# Patient Record
Sex: Female | Born: 1950
Health system: Southern US, Community
[De-identification: ages and names within clinical notes are randomized; demographics above are authoritative.]

## PROBLEM LIST (undated history)

## (undated) DIAGNOSIS — R5382 Chronic fatigue, unspecified: Secondary | ICD-10-CM

## (undated) DIAGNOSIS — U071 COVID-19: Secondary | ICD-10-CM

## (undated) DIAGNOSIS — J45909 Unspecified asthma, uncomplicated: Secondary | ICD-10-CM

## (undated) DIAGNOSIS — G4730 Sleep apnea, unspecified: Secondary | ICD-10-CM

## (undated) DIAGNOSIS — E213 Hyperparathyroidism, unspecified: Secondary | ICD-10-CM

## (undated) DIAGNOSIS — G43909 Migraine, unspecified, not intractable, without status migrainosus: Secondary | ICD-10-CM

## (undated) DIAGNOSIS — F32A Depression, unspecified: Secondary | ICD-10-CM

## (undated) DIAGNOSIS — I10 Essential (primary) hypertension: Secondary | ICD-10-CM

## (undated) DIAGNOSIS — M1990 Unspecified osteoarthritis, unspecified site: Secondary | ICD-10-CM

## (undated) DIAGNOSIS — T7840XA Allergy, unspecified, initial encounter: Secondary | ICD-10-CM

## (undated) HISTORY — PX: REPLACEMENT TOTAL KNEE BILATERAL: SUR1225

## (undated) HISTORY — PX: TOTAL HIP ARTHROPLASTY: SHX124

## (undated) HISTORY — DX: Unspecified asthma, uncomplicated: J45.909

## (undated) HISTORY — DX: Allergy, unspecified, initial encounter: T78.40XA

## (undated) HISTORY — DX: Migraine, unspecified, not intractable, without status migrainosus: G43.909

## (undated) HISTORY — PX: LIGAMENT REPAIR: SHX5444

## (undated) HISTORY — DX: Essential (primary) hypertension: I10

## (undated) HISTORY — DX: Chronic fatigue, unspecified: R53.82

---

## 1983-06-06 HISTORY — PX: ABDOMINAL HYSTERECTOMY: SHX81

## 2006-10-15 ENCOUNTER — Encounter: Admission: RE | Admit: 2006-10-15 | Discharge: 2006-10-15 | Payer: Self-pay | Admitting: Orthopedic Surgery

## 2008-04-23 ENCOUNTER — Emergency Department (HOSPITAL_BASED_OUTPATIENT_CLINIC_OR_DEPARTMENT_OTHER): Admission: EM | Admit: 2008-04-23 | Discharge: 2008-04-23 | Payer: Self-pay | Admitting: Emergency Medicine

## 2008-11-21 ENCOUNTER — Emergency Department (HOSPITAL_BASED_OUTPATIENT_CLINIC_OR_DEPARTMENT_OTHER): Admission: EM | Admit: 2008-11-21 | Discharge: 2008-11-21 | Payer: Self-pay | Admitting: Emergency Medicine

## 2008-11-21 ENCOUNTER — Ambulatory Visit: Payer: Self-pay | Admitting: Diagnostic Radiology

## 2009-12-05 ENCOUNTER — Emergency Department (HOSPITAL_BASED_OUTPATIENT_CLINIC_OR_DEPARTMENT_OTHER): Admission: EM | Admit: 2009-12-05 | Discharge: 2009-12-05 | Payer: Self-pay | Admitting: Emergency Medicine

## 2009-12-05 ENCOUNTER — Ambulatory Visit: Payer: Self-pay | Admitting: Diagnostic Radiology

## 2010-08-21 LAB — URINALYSIS, ROUTINE W REFLEX MICROSCOPIC
Bilirubin Urine: NEGATIVE
Glucose, UA: NEGATIVE mg/dL
Hgb urine dipstick: NEGATIVE
Ketones, ur: NEGATIVE mg/dL
Nitrite: NEGATIVE
Protein, ur: NEGATIVE mg/dL
Specific Gravity, Urine: 1.017 (ref 1.005–1.030)
Urobilinogen, UA: 0.2 mg/dL (ref 0.0–1.0)
pH: 7 (ref 5.0–8.0)

## 2010-08-21 LAB — CBC
HCT: 38.8 % (ref 36.0–46.0)
Hemoglobin: 13 g/dL (ref 12.0–15.0)
MCH: 29.7 pg (ref 26.0–34.0)
MCHC: 33.5 g/dL (ref 30.0–36.0)
MCV: 88.7 fL (ref 78.0–100.0)
Platelets: 291 K/uL (ref 150–400)
RBC: 4.37 MIL/uL (ref 3.87–5.11)
RDW: 12.2 % (ref 11.5–15.5)
WBC: 10.1 K/uL (ref 4.0–10.5)

## 2010-08-21 LAB — COMPREHENSIVE METABOLIC PANEL WITH GFR
BUN: 18 mg/dL (ref 6–23)
Chloride: 108 meq/L (ref 96–112)
GFR calc Af Amer: >60 mL/min (ref >60)
Glucose, Bld: 103 mg/dL — ABNORMAL HIGH (ref 70–99)
Potassium: 4.3 meq/L (ref 3.5–5.1)
Total Protein: 7 g/dL (ref 6.0–8.3)

## 2010-08-21 LAB — DIFFERENTIAL
Basophils Absolute: 0.1 10*3/uL (ref 0.0–0.1)
Basophils Relative: 1 % (ref 0–1)
Eosinophils Absolute: 1.8 10*3/uL — ABNORMAL HIGH (ref 0.0–0.7)
Eosinophils Relative: 18 % — ABNORMAL HIGH (ref 0–5)
Lymphocytes Relative: 18 % (ref 12–46)
Lymphs Abs: 1.8 10*3/uL (ref 0.7–4.0)
Monocytes Absolute: 0.7 10*3/uL (ref 0.1–1.0)
Monocytes Relative: 7 % (ref 3–12)
Neutro Abs: 5.7 10*3/uL (ref 1.7–7.7)
Neutrophils Relative %: 57 % (ref 43–77)

## 2010-08-21 LAB — COMPREHENSIVE METABOLIC PANEL
ALT: 16 U/L (ref 0–35)
AST: 22 U/L (ref 0–37)
Albumin: 3.7 g/dL (ref 3.5–5.2)
Alkaline Phosphatase: 99 U/L (ref 39–117)
CO2: 29 mEq/L (ref 19–32)
Calcium: 9.3 mg/dL (ref 8.4–10.5)
Creatinine, Ser: 0.7 mg/dL (ref 0.4–1.2)
GFR calc non Af Amer: >60 mL/min (ref >60)
Sodium: 144 mEq/L (ref 135–145)
Total Bilirubin: 0.5 mg/dL (ref 0.3–1.2)

## 2010-08-21 LAB — LIPASE, BLOOD: Lipase: 149 U/L (ref 23–300)

## 2012-11-21 ENCOUNTER — Encounter: Payer: Self-pay | Admitting: Family Medicine

## 2012-11-21 ENCOUNTER — Ambulatory Visit (INDEPENDENT_AMBULATORY_CARE_PROVIDER_SITE_OTHER): Payer: BC Managed Care – PPO | Admitting: Family Medicine

## 2012-11-21 VITALS — BP 184/99 | HR 76 | Temp 97.6°F | Ht 64.0 in | Wt 204.6 lb

## 2012-11-21 DIAGNOSIS — M25519 Pain in unspecified shoulder: Secondary | ICD-10-CM

## 2012-11-21 DIAGNOSIS — M25511 Pain in right shoulder: Secondary | ICD-10-CM

## 2012-11-21 DIAGNOSIS — J4551 Severe persistent asthma with (acute) exacerbation: Secondary | ICD-10-CM

## 2012-11-21 DIAGNOSIS — J45901 Unspecified asthma with (acute) exacerbation: Secondary | ICD-10-CM

## 2012-11-21 MED ORDER — FLUTICASONE-SALMETEROL 100-50 MCG/DOSE IN AEPB: 1.0000 | INHALATION_SPRAY | Freq: Two times a day (BID) | RESPIRATORY_TRACT | Status: DC

## 2012-11-21 MED ORDER — PREDNISONE 50 MG PO TABS: ORAL_TABLET | ORAL | Status: DC

## 2012-11-21 MED ORDER — CYCLOBENZAPRINE HCL 5 MG PO TABS: 5.0000 mg | ORAL_TABLET | Freq: Three times a day (TID) | ORAL | Status: DC | PRN

## 2013-01-07 NOTE — Progress Notes (Signed)
°  Subjective:    Patient ID: Traci Johnson, female    DOB: [birth year], 62 y.o.   MRN: [id number]  HPI R shoulder pain x 2 weeks  Pt with prior hx/o R shoulder pain in the past Feels like she may have over exerted her self doing house work  No radicular sxs  No known trauma.   Pt also with mild wheezing over past week No SOB Baseline hx/o asthma.   Review of Systems  All other systems reviewed and are negative.       Objective:   Physical Exam  Constitutional:  Obese, NAD     HENT:  Head: Normocephalic and atraumatic.  Eyes: Conjunctivae are normal. Pupils are equal, round, and reactive to light.  Neck: Normal range of motion.  Cardiovascular: Normal rate and regular rhythm.   Pulmonary/Chest: Effort normal.  Mild wheezing    Abdominal: Soft.  Musculoskeletal:       Arms: Neurological: She is alert.  Skin: Skin is warm.          Assessment & Plan:  Pain in joint, shoulder region, right - Plan: predniSONE (DELTASONE) 50 MG tablet, cyclobenzaprine (FLEXERIL) 5 MG tablet  Asthma exacerbation, non-allergic, severe persistent - Plan: Fluticasone-Salmeterol (ADVAIR) 100-50 MCG/DOSE AEPB  Plan as above  Advair for long term maintenance Discussed resp red flags. Oral prednisone should help with shoulder pain and resp issues.  Discussed MSK red flags.  Follow up as needed.

## 2013-06-19 ENCOUNTER — Encounter: Payer: Self-pay | Admitting: Family Medicine

## 2013-06-19 ENCOUNTER — Ambulatory Visit (INDEPENDENT_AMBULATORY_CARE_PROVIDER_SITE_OTHER): Payer: BC Managed Care – PPO | Admitting: Family Medicine

## 2013-06-19 VITALS — BP 162/86 | HR 87 | Temp 96.5°F | Ht 64.0 in | Wt 212.8 lb

## 2013-06-19 DIAGNOSIS — J45909 Unspecified asthma, uncomplicated: Secondary | ICD-10-CM

## 2013-06-19 DIAGNOSIS — Z23 Encounter for immunization: Secondary | ICD-10-CM

## 2013-06-19 DIAGNOSIS — M1990 Unspecified osteoarthritis, unspecified site: Secondary | ICD-10-CM

## 2013-06-19 DIAGNOSIS — I10 Essential (primary) hypertension: Secondary | ICD-10-CM

## 2013-06-19 DIAGNOSIS — M129 Arthropathy, unspecified: Secondary | ICD-10-CM

## 2013-06-19 DIAGNOSIS — M542 Cervicalgia: Secondary | ICD-10-CM

## 2013-06-19 MED ORDER — MELOXICAM 15 MG PO TABS: 15.0000 mg | ORAL_TABLET | Freq: Every day | ORAL | Status: DC

## 2013-06-19 MED ORDER — FLUTICASONE-SALMETEROL 100-50 MCG/DOSE IN AEPB: 1.0000 | INHALATION_SPRAY | Freq: Two times a day (BID) | RESPIRATORY_TRACT | Status: DC

## 2013-06-19 MED ORDER — LISINOPRIL 10 MG PO TABS: 10.0000 mg | ORAL_TABLET | Freq: Every day | ORAL | Status: DC

## 2013-06-19 MED ORDER — CYCLOBENZAPRINE HCL 5 MG PO TABS: 5.0000 mg | ORAL_TABLET | Freq: Three times a day (TID) | ORAL | Status: DC | PRN

## 2013-06-19 NOTE — Progress Notes (Signed)
° °  Subjective:    Patient ID: Traci Johnson, female    DOB: 01-20-1951, 63 y.o.   MRN: [id number]  HPI This 63 y.o. female presents for evaluation of c/o pain in her neck and shoulders.  She States she has difficulty sleeping.  She states she has bp problems, cramps in legs and her Feet at hs.  She has hx cluster ha's and sees neurology prn.  She snores.  She has insomnia she States because she hurts a lot. She states she has a broken tail and gets steroid injections from A pain doctor.  She gets steroid injections in her back.  She has pain level of 8 on scale of 1-10.   Review of Systems C/o arthralgias/ myalgias. No chest pain, SOB, HA, dizziness, vision change, N/V, diarrhea, constipation, dysuria, urinary urgency or frequency or rash.     Objective:   Physical Exam  Vital signs noted  Well developed well nourished female.  HEENT - Head atraumatic Normocephalic                Eyes - PERRLA, Conjuctiva - clear Sclera- Clear EOMI                Ears - EAC's Wnl TM's Wnl Gross Hearing WNL                Nose - Nares patent                 Throat - oropharanx wnl Respiratory - Lungs CTA bilateral Cardiac - RRR S1 and S2 without murmur GI - Abdomen soft Nontender and bowel sounds active x 4 Extremities - No edema. Neuro - Grossly intact.      Assessment & Plan:  Asthma - Plan: Fluticasone-Salmeterol (ADVAIR) 100-50 MCG/DOSE AEPB  Cervicalgia - Plan: cyclobenzaprine (FLEXERIL) 5 MG tablet.  Recommend massage therapist.  Essential hypertension, benign - Plan: lisinopril (PRINIVIL,ZESTRIL) 10 MG tablet  Arthritis - Plan: meloxicam (MOBIC) 15 MG tablet  Need for shingles vaccine - Plan: Varicella-zoster vaccine subcutaneous  Lysbeth Penner FNP

## 2013-06-19 NOTE — Patient Instructions (Signed)
Herpes Zoster Virus Vaccine What is this medicine? HERPES ZOSTER VIRUS VACCINE (HUR peez ZOS ter vahy ruhs vak SEEN) is a vaccine. It is used to prevent shingles in adults 62 years old and over. This vaccine is not used to treat shingles or nerve pain from shingles. This medicine may be used for other purposes; ask your health care provider or pharmacist if you have questions. COMMON BRAND NAME(S): Zostavax What should I tell my health care provider before I take this medicine? They need to know if you have any of these conditions: -cancer like leukemia or lymphoma -immune system problems or therapy -infection with fever -tuberculosis -an unusual or allergic reaction to vaccines, neomycin, gelatin, other medicines, foods, dyes, or preservatives -pregnant or trying to get pregnant -breast-feeding How should I use this medicine? This vaccine is for injection under the skin. It is given by a health care professional. Talk to your pediatrician regarding the use of this medicine in children. This medicine is not approved for use in children. Overdosage: If you think you have taken too much of this medicine contact a poison control center or emergency room at once. NOTE: This medicine is only for you. Do not share this medicine with others. What if I miss a dose? This does not apply. What may interact with this medicine? Do not take this medicine with any of the following medications: -adalimumab -anakinra -etanercept -infliximab -medicines to treat cancer -medicines that suppress your immune system This medicine may also interact with the following medications: -immunoglobulins -steroid medicines like prednisone or cortisone This list may not describe all possible interactions. Give your health care provider a list of all the medicines, herbs, non-prescription drugs, or dietary supplements you use. Also tell them if you smoke, drink alcohol, or use illegal drugs. Some items may interact  with your medicine. What should I watch for while using this medicine? Visit your doctor for regular check ups. This vaccine, like all vaccines, may not fully protect everyone. After receiving this vaccine it may be possible to pass chickenpox infection to others. Avoid people with immune system problems, pregnant women who have not had chickenpox, and newborns of women who have not had chickenpox. Talk to your doctor for more information. What side effects may I notice from receiving this medicine? Side effects that you should report to your doctor or health care professional as soon as possible: -allergic reactions like skin rash, itching or hives, swelling of the face, lips, or tongue -breathing problems -feeling faint or lightheaded, falls -fever, flu-like symptoms -pain, tingling, numbness in the hands or feet -swelling of the ankles, feet, hands -unusually weak or tired Side effects that usually do not require medical attention (report to your doctor or health care professional if they continue or are bothersome): -aches or pains -chickenpox-like rash -diarrhea -headache -loss of appetite -nausea, vomiting -redness, pain, swelling at site where injected -runny nose This list may not describe all possible side effects. Call your doctor for medical advice about side effects. You may report side effects to FDA at 1-800-FDA-1088. Where should I keep my medicine? This drug is given in a hospital or clinic and will not be stored at home. NOTE: This sheet is a summary. It may not cover all possible information. If you have questions about this medicine, talk to your doctor, pharmacist, or health care provider.  2014, Elsevier/Gold Standard. (2009-11-08 17:43:50)

## 2013-07-01 ENCOUNTER — Other Ambulatory Visit: Payer: Self-pay | Admitting: Family Medicine

## 2013-07-01 ENCOUNTER — Telehealth: Payer: Self-pay | Admitting: Family Medicine

## 2013-07-01 MED ORDER — AMLODIPINE BESYLATE 5 MG PO TABS: 5.0000 mg | ORAL_TABLET | Freq: Every day | ORAL | Status: DC

## 2013-07-01 NOTE — Telephone Encounter (Signed)
DC lisinopril and start on amlodipine 5mg  po qd and was sent in to pharmacy

## 2013-07-01 NOTE — Telephone Encounter (Signed)
°

## 2013-07-02 NOTE — Telephone Encounter (Signed)
Pt notified and verbalized understanding.

## 2013-08-20 ENCOUNTER — Telehealth: Payer: Self-pay | Admitting: Family Medicine

## 2013-08-20 NOTE — Telephone Encounter (Signed)
appt with bill 3/24

## 2013-08-26 ENCOUNTER — Ambulatory Visit (INDEPENDENT_AMBULATORY_CARE_PROVIDER_SITE_OTHER): Payer: BC Managed Care – PPO | Admitting: Family Medicine

## 2013-08-26 ENCOUNTER — Ambulatory Visit (INDEPENDENT_AMBULATORY_CARE_PROVIDER_SITE_OTHER): Payer: BC Managed Care – PPO

## 2013-08-26 VITALS — BP 144/87 | HR 73 | Temp 97.0°F | Ht 64.0 in | Wt 214.0 lb

## 2013-08-26 DIAGNOSIS — M25519 Pain in unspecified shoulder: Secondary | ICD-10-CM

## 2013-08-26 DIAGNOSIS — F411 Generalized anxiety disorder: Secondary | ICD-10-CM

## 2013-08-26 DIAGNOSIS — M542 Cervicalgia: Secondary | ICD-10-CM

## 2013-08-26 DIAGNOSIS — M1990 Unspecified osteoarthritis, unspecified site: Secondary | ICD-10-CM

## 2013-08-26 DIAGNOSIS — M129 Arthropathy, unspecified: Secondary | ICD-10-CM

## 2013-08-26 MED ORDER — CYCLOBENZAPRINE HCL 5 MG PO TABS: 5.0000 mg | ORAL_TABLET | Freq: Three times a day (TID) | ORAL | Status: DC | PRN

## 2013-08-26 MED ORDER — ALPRAZOLAM 0.5 MG PO TABS: 0.5000 mg | ORAL_TABLET | Freq: Every evening | ORAL | Status: DC | PRN

## 2013-08-26 MED ORDER — MELOXICAM 15 MG PO TABS: 15.0000 mg | ORAL_TABLET | Freq: Every day | ORAL | Status: DC

## 2013-08-26 NOTE — Progress Notes (Signed)
° °  Subjective:    Patient ID: Traci Johnson, female    DOB: 05-04-1951, 63 y.o.   MRN: [id number]  HPI This 63 y.o. female presents for evaluation of right scapula, neck,and shoulder pain.  She has had This on and off for the last 3 years.  Patient is going on a trip and wants some xanax for the flight since she has claustrophobia.   Review of Systems C/o shoulder pain No chest pain, SOB, HA, dizziness, vision change, N/V, diarrhea, constipation, dysuria, urinary urgency or frequency or rash.     Objective:   Physical Exam   Vital signs noted  Well developed well nourished female.  HEENT - Head atraumatic Normocephalic                Eyes - PERRLA, Conjuctiva - clear Sclera- Clear EOMI                Ears - EAC's Wnl TM's Wnl Gross Hearing WNL                Throat - oropharanx wnl Respiratory - Lungs CTA bilateral Cardiac - RRR S1 and S2 without murmur MS - TTP right scapula right trapezius, and upper back and lower neck area.    Assessment & Plan:  Pain in joint, shoulder region - Plan: DG Shoulder Right, meloxicam (MOBIC) 15 MG tablet, cyclobenzaprine (FLEXERIL) 5 MG tablet, Ambulatory referral to Physical Therapy  Arthritis - Plan: meloxicam (MOBIC) 15 MG tablet  Cervicalgia - Plan: cyclobenzaprine (FLEXERIL) 5 MG tablet, Ambulatory referral to Physical Therapy  Anxiety state, unspecified - Plan: ALPRAZolam Duanne Moron) 0.5 MG tablet  Lysbeth Penner FNP

## 2013-10-21 ENCOUNTER — Ambulatory Visit (INDEPENDENT_AMBULATORY_CARE_PROVIDER_SITE_OTHER): Payer: BC Managed Care – PPO | Admitting: Family Medicine

## 2013-10-21 ENCOUNTER — Encounter: Payer: Self-pay | Admitting: Family Medicine

## 2013-10-21 VITALS — BP 144/75 | HR 97 | Temp 98.0°F | Ht 64.0 in | Wt 216.4 lb

## 2013-10-21 DIAGNOSIS — K5289 Other specified noninfective gastroenteritis and colitis: Secondary | ICD-10-CM

## 2013-10-21 DIAGNOSIS — K529 Noninfective gastroenteritis and colitis, unspecified: Secondary | ICD-10-CM

## 2013-10-21 MED ORDER — ONDANSETRON 8 MG PO TBDP: 8.0000 mg | ORAL_TABLET | Freq: Three times a day (TID) | ORAL | Status: DC | PRN

## 2013-10-21 NOTE — Progress Notes (Signed)
° °  Subjective:    Patient ID: Traci Johnson, female    DOB: Jul 30, 1950, 63 y.o.   MRN: [id number]  HPI  This 63 y.o. female presents for evaluation of nausea and GI upset.  She has had this for the last few Days and when she eats she gets bloated and she has diarrhea.  She has nausea and she states she does not have pain or worsening nausea after eating.  Review of Systems C/o nausea   No chest pain, SOB, HA, dizziness, vision change, N/V, diarrhea, constipation, dysuria, urinary urgency or frequency, myalgias, arthralgias or rash.  Objective:   Physical Exam  Vital signs noted  Well developed well nourished female.  HEENT - Head atraumatic Normocephalic                Eyes - PERRLA, Conjuctiva - clear Sclera- Clear EOMI                Ears - EAC's Wnl TM's Wnl Gross Hearing WNL                Throat - oropharanx wnl Respiratory - Lungs CTA bilateral Cardiac - RRR S1 and S2 without murmur GI - Abdomen soft Nontender and bowel sounds active x 4     Assessment & Plan:  Gastroenteritis - Plan: ondansetron (ZOFRAN ODT) 8 MG disintegrating tablet po tid prn Push po fluids, rest, tylenol and motrin otc prn as directed for fever, arthralgias, and myalgias.  Follow up prn if sx's continue or persist.  Lysbeth Penner FNP

## 2013-11-12 LAB — HM MAMMOGRAPHY

## 2013-12-24 ENCOUNTER — Encounter: Payer: Self-pay | Admitting: Family Medicine

## 2013-12-24 ENCOUNTER — Ambulatory Visit (INDEPENDENT_AMBULATORY_CARE_PROVIDER_SITE_OTHER): Payer: BC Managed Care – PPO | Admitting: Family Medicine

## 2013-12-24 VITALS — BP 149/86 | HR 91 | Temp 97.3°F | Ht 64.0 in | Wt 215.6 lb

## 2013-12-24 DIAGNOSIS — I10 Essential (primary) hypertension: Secondary | ICD-10-CM

## 2013-12-24 NOTE — Progress Notes (Signed)
° °  Subjective:    Patient ID: Traci Johnson, female    DOB: 01/09/1951, 63 y.o.   MRN: [id number]  HPI This 63 y.o. female presents for evaluation of follow up on HTN.  Her bp is elevated.   Review of Systems    No chest pain, SOB, HA, dizziness, vision change, N/V, diarrhea, constipation, dysuria, urinary urgency or frequency, myalgias, arthralgias or rash.  Objective:   Physical Exam Vital signs noted  Well developed well nourished female.  HEENT - Head atraumatic Normocephalic                Eyes - PERRLA, Conjuctiva - clear Sclera- Clear EOMI                Ears - EAC's Wnl TM's Wnl Gross Hearing WNL                Nose - Nares patent                 Throat - oropharanx wnl Respiratory - Lungs CTA bilateral Cardiac - RRR S1 and S2 without murmur GI - Abdomen soft Nontender and bowel sounds active x 4 Extremities - No edema. Neuro - Grossly intact.       Assessment & Plan:  HTN - double norvasc and follow up in one month  Lysbeth Penner FNP

## 2014-01-09 ENCOUNTER — Telehealth: Payer: Self-pay | Admitting: Family Medicine

## 2014-01-10 ENCOUNTER — Other Ambulatory Visit: Payer: Self-pay | Admitting: Nurse Practitioner

## 2014-01-10 ENCOUNTER — Other Ambulatory Visit: Payer: Self-pay | Admitting: Family Medicine

## 2014-01-10 ENCOUNTER — Telehealth: Payer: Self-pay | Admitting: Family Medicine

## 2014-01-10 MED ORDER — AMLODIPINE BESYLATE 5 MG PO TABS: 5.0000 mg | ORAL_TABLET | Freq: Every day | ORAL | Status: DC

## 2014-01-10 MED ORDER — FUROSEMIDE 40 MG PO TABS: ORAL_TABLET | ORAL | Status: DC

## 2014-01-10 MED ORDER — HYDROCHLOROTHIAZIDE 25 MG PO TABS: 25.0000 mg | ORAL_TABLET | Freq: Every day | ORAL | Status: DC

## 2014-01-10 NOTE — Telephone Encounter (Signed)
rx for swelling sent

## 2014-01-10 NOTE — Telephone Encounter (Signed)
Patient was told to call back in after starting her BP pill because it would cause fluid build up. Patient now states that it is and she would like a fluid pill called in

## 2014-01-10 NOTE — Telephone Encounter (Signed)
Decrease amlodipine back to 5 mg dailyt and added HCTZ 25mg  daily- patient will keep check of blood pressure at home an dwill call if not staying down.

## 2014-02-02 ENCOUNTER — Other Ambulatory Visit: Payer: Self-pay | Admitting: Family Medicine

## 2014-05-20 ENCOUNTER — Emergency Department (HOSPITAL_COMMUNITY)
Admission: EM | Admit: 2014-05-20 | Discharge: 2014-05-21 | Disposition: A | Discharge status: Home / Self Care | Payer: BC Managed Care – PPO | Attending: Emergency Medicine | Admitting: Emergency Medicine

## 2014-05-20 ENCOUNTER — Encounter (HOSPITAL_COMMUNITY): Payer: Self-pay

## 2014-05-20 DIAGNOSIS — Z791 Long term (current) use of non-steroidal anti-inflammatories (NSAID): Secondary | ICD-10-CM | POA: Insufficient documentation

## 2014-05-20 DIAGNOSIS — Z7982 Long term (current) use of aspirin: Secondary | ICD-10-CM | POA: Diagnosis not present

## 2014-05-20 DIAGNOSIS — Z79899 Other long term (current) drug therapy: Secondary | ICD-10-CM | POA: Diagnosis not present

## 2014-05-20 DIAGNOSIS — G43909 Migraine, unspecified, not intractable, without status migrainosus: Secondary | ICD-10-CM | POA: Diagnosis not present

## 2014-05-20 DIAGNOSIS — I4940 Unspecified premature depolarization: Secondary | ICD-10-CM | POA: Diagnosis not present

## 2014-05-20 DIAGNOSIS — J45909 Unspecified asthma, uncomplicated: Secondary | ICD-10-CM | POA: Insufficient documentation

## 2014-05-20 DIAGNOSIS — I10 Essential (primary) hypertension: Secondary | ICD-10-CM | POA: Insufficient documentation

## 2014-05-20 DIAGNOSIS — I4949 Other premature depolarization: Secondary | ICD-10-CM

## 2014-05-20 DIAGNOSIS — R002 Palpitations: Secondary | ICD-10-CM | POA: Diagnosis present

## 2014-05-20 NOTE — ED Notes (Signed)
Fluttering feeling in her chest, denies pain or sob, states has been going on for about 2 weeks, but worse tonight.

## 2014-05-20 NOTE — ED Provider Notes (Signed)
CSN: [id number]     Arrival date & time 05/20/14  2336 History  This chart was scribed for Traci Fines, MD by Rayfield Citizen, ED Scribe. This patient was seen in room APA04/APA04 and the patient's care was started at 11:43 PM.    Chief Complaint  Patient presents with   Palpitations   HPI   HPI Comments: Traci Johnson is a 63 y.o. female with past medical history of asthma and HTN who presents to the Emergency Department complaining of 2 weeks of intermittent palpitation sensations; she describes these as a "gurgling" substernal sensation. The are occurring every several minutes and lasting a few seconds at a time. She reports that the sensation acutely worsened tonight after lying down, prompting her to come to the ED. Nothing makes symptoms better or worse. She denies chest pain, SOB or lightheadedness.   She reports one prior experience with similar symptoms 2 years PTA; she was placed on a heart monitor for 24 hours with no conclusive results. She did not see a cardiologist at that time.   Past Medical History  Diagnosis Date   Asthma    Migraines     cluster   Allergy    Hypertension    Past Surgical History  Procedure Laterality Date   Abdominal hysterectomy  1985   Ligament repair Right     birth defect   No family history on file. History  Substance Use Topics   Smoking status: Never Smoker    Smokeless tobacco: Not on file   Alcohol Use: No   OB History    No data available     Review of Systems  A complete 10 system review of systems was obtained and all systems are negative except as noted in the HPI and PMH.  Allergies  Codeine and Lisinopril  Home Medications   Prior to Admission medications   Medication Sig Start Date End Date Taking? Authorizing Provider  ADVAIR DISKUS 100-50 MCG/DOSE AEPB INHALE ONE DOSE BY MOUTH EVERY 12 HOURS 02/03/14  Yes Lysbeth Penner, FNP  albuterol (PROVENTIL HFA;VENTOLIN HFA) 108 (90 BASE) MCG/ACT inhaler Inhale 2  puffs into the lungs every 6 (six) hours as needed for wheezing.   Yes Historical Provider, MD  amLODipine (NORVASC) 5 MG tablet Take 1 tablet (5 mg total) by mouth daily. 01/10/14  Yes Mary-Margaret Hassell Done, FNP  aspirin 81 MG tablet Take 81 mg by mouth daily.   Yes Historical Provider, MD  calcium carbonate 1250 MG capsule Take 1,250 mg by mouth 2 (two) times daily with a meal.   Yes Historical Provider, MD  ergocalciferol (VITAMIN D2) 50000 UNITS capsule Take 50,000 Units by mouth once a week.   Yes Historical Provider, MD  estradiol (ESTRACE) 1 MG tablet Take 1 mg by mouth daily.   Yes Historical Provider, MD  hydrochlorothiazide (HYDRODIURIL) 25 MG tablet Take 1 tablet (25 mg total) by mouth daily. 01/10/14  Yes Mary-Margaret Hassell Done, FNP  vitamin B-12 (CYANOCOBALAMIN) 1000 MCG tablet Take 1,000 mcg by mouth daily.   Yes Historical Provider, MD  vitamin E (VITAMIN E) 400 UNIT capsule Take 400 Units by mouth daily.   Yes Historical Provider, MD  cyclobenzaprine (FLEXERIL) 5 MG tablet Take 1 tablet (5 mg total) by mouth 3 (three) times daily as needed for muscle spasms. 08/26/13   Lysbeth Penner, FNP  furosemide (LASIX) 40 MG tablet One po qd 2-3 days prn swelling 01/10/14   Lysbeth Penner, FNP  meloxicam [facility]) 15  MG tablet Take 1 tablet (15 mg total) by mouth daily. 08/26/13   Lysbeth Penner, FNP  SUMAtriptan (IMITREX) 6 MG/0.5ML SOLN injection Inject 6 mg into the skin every 2 (two) hours as needed for migraine or headache. F    Historical Provider, MD   BP 134/75 mmHg   Pulse 92   Temp(Src) 97.4 F (36.3 C) (Oral)   Resp 16   Ht 5\' 3"  (1.6 m)   Wt 200 lb (90.719 kg)   BMI 35.44 kg/m2   SpO2 93%   Physical Exam  General: Well-developed, well-nourished female in no acute distress; appearance consistent with age of record HENT: normocephalic; atraumatic Eyes: pupils equal, round and reactive to light; extraocular muscles intact Neck: supple Heart: regular rate and rhythm; no murmurs, rubs or  gallops; NSR on monitor; occasional ectopy (PACs > PVCs) which reproduces patient's symptoms  Lungs: clear to auscultation bilaterally Abdomen: soft; nondistended; nontender; no masses or hepatosplenomegaly; bowel sounds present Extremities: No deformity; full range of motion; pulses normal Neurologic: Awake, alert and oriented; motor function intact in all extremities and symmetric; no facial droop Skin: Warm and dry Psychiatric: Anxious  ED Course  Procedures   DIAGNOSTIC STUDIES: Oxygen Saturation is 98% on RA, normal by my interpretation.    COORDINATION OF CARE: 11:55 PM Occasional PACs (and less frequent PVCs) seen which are symptomatic to the patient.  12:01 AM PM Discussed treatment plan with pt at bedside, including potential referral to a cardiologist and pt agreed to plan.   EKG Interpretation  Date/Time:  Wednesday May 20 2014 23:45:22 EST Ventricular Rate:  93 PR Interval:  244 QRS Duration: 83 QT Interval:  361 QTC Calculation: 449 R Axis:   -7 Text Interpretation:  Sinus rhythm Prolonged PR interval Low voltage, precordial leads Consider anterior infarct No old tracing to compare Confirmed by [facility]  MD, Jenny Reichmann ([id number]) on 05/21/2014 12:06:00 AM      MDM   Nursing notes and vitals signs, including pulse oximetry, reviewed.  Summary of this visit's results, reviewed by myself:  Labs:  Results for orders placed or performed during the hospital encounter of 05/20/14 (from the past 24 hour(s))  Basic metabolic panel     Status: Abnormal   Collection Time: 05/21/14 12:18 AM  Result Value Ref Range   Sodium 140 137 - 147 mEq/L   Potassium 3.7 3.7 - 5.3 mEq/L   Chloride 101 96 - 112 mEq/L   CO2 27 19 - 32 mEq/L   Glucose, Bld 111 (H) 70 - 99 mg/dL   BUN 16 6 - 23 mg/dL   Creatinine, Ser 0.66 0.50 - 1.10 mg/dL   Calcium 11.2 (H) 8.4 - 10.5 mg/dL   GFR calc non Af Amer >90 >90 mL/min   GFR calc Af Amer >90 >90 mL/min   Anion gap 12 5 - 15  CBC with  Differential     Status: Abnormal   Collection Time: 05/21/14 12:18 AM  Result Value Ref Range   WBC 12.5 (H) 4.0 - 10.5 K/uL   RBC 4.69 3.87 - 5.11 MIL/uL   Hemoglobin 13.5 12.0 - 15.0 g/dL   HCT 41.4 36.0 - 46.0 %   MCV 88.3 78.0 - 100.0 fL   MCH 28.8 26.0 - 34.0 pg   MCHC 32.6 30.0 - 36.0 g/dL   RDW 13.8 11.5 - 15.5 %   Platelets 350 150 - 400 K/uL   Neutrophils Relative % 59 43 - 77 %   Neutro Abs  7.5 1.7 - 7.7 K/uL   Lymphocytes Relative 22 12 - 46 %   Lymphs Abs 2.7 0.7 - 4.0 K/uL   Monocytes Relative 8 3 - 12 %   Monocytes Absolute 1.0 0.1 - 1.0 K/uL   Eosinophils Relative 11 (H) 0 - 5 %   Eosinophils Absolute 1.3 (H) 0.0 - 0.7 K/uL   Basophils Relative 0 0 - 1 %   Basophils Absolute 0.0 0.0 - 0.1 K/uL  Troponin I     Status: None   Collection Time: 05/21/14 12:18 AM  Result Value Ref Range   Troponin I <0.30 <0.30 ng/mL     I personally performed the services described in this documentation, which was scribed in my presence. The recorded information has been reviewed and is accurate.      Traci Fines, MD 05/21/14 [phone]

## 2014-05-21 LAB — CBC WITH DIFFERENTIAL/PLATELET
BASOS ABS: 0 10*3/uL (ref 0.0–0.1)
BASOS PCT: 0 % (ref 0–1)
EOS ABS: 1.3 10*3/uL — AB (ref 0.0–0.7)
EOS PCT: 11 % — AB (ref 0–5)
HCT: 41.4 % (ref 36.0–46.0)
Hemoglobin: 13.5 g/dL (ref 12.0–15.0)
Lymphocytes Relative: 22 % (ref 12–46)
Lymphs Abs: 2.7 10*3/uL (ref 0.7–4.0)
MCH: 28.8 pg (ref 26.0–34.0)
MCHC: 32.6 g/dL (ref 30.0–36.0)
MCV: 88.3 fL (ref 78.0–100.0)
Monocytes Absolute: 1 10*3/uL (ref 0.1–1.0)
Monocytes Relative: 8 % (ref 3–12)
Neutro Abs: 7.5 10*3/uL (ref 1.7–7.7)
Neutrophils Relative %: 59 % (ref 43–77)
PLATELETS: 350 10*3/uL (ref 150–400)
RBC: 4.69 MIL/uL (ref 3.87–5.11)
RDW: 13.8 % (ref 11.5–15.5)
WBC: 12.5 10*3/uL — ABNORMAL HIGH (ref 4.0–10.5)

## 2014-05-21 LAB — BASIC METABOLIC PANEL
Anion gap: 12 (ref 5–15)
BUN: 16 mg/dL (ref 6–23)
CO2: 27 mEq/L (ref 19–32)
Calcium: 11.2 mg/dL — ABNORMAL HIGH (ref 8.4–10.5)
Chloride: 101 mEq/L (ref 96–112)
Creatinine, Ser: 0.66 mg/dL (ref 0.50–1.10)
Glucose, Bld: 111 mg/dL — ABNORMAL HIGH (ref 70–99)
Potassium: 3.7 mEq/L (ref 3.7–5.3)
SODIUM: 140 meq/L (ref 137–147)

## 2014-05-21 LAB — TROPONIN I

## 2014-05-21 LAB — TSH: TSH: 4.55 u[IU]/mL — ABNORMAL HIGH (ref 0.350–4.500)

## 2014-06-02 ENCOUNTER — Other Ambulatory Visit: Payer: Self-pay

## 2014-06-02 ENCOUNTER — Ambulatory Visit (INDEPENDENT_AMBULATORY_CARE_PROVIDER_SITE_OTHER): Payer: BC Managed Care – PPO | Admitting: Internal Medicine

## 2014-06-02 ENCOUNTER — Encounter: Payer: Self-pay | Admitting: Internal Medicine

## 2014-06-02 VITALS — BP 150/82 | HR 84 | Ht 63.0 in | Wt 201.4 lb

## 2014-06-02 DIAGNOSIS — R002 Palpitations: Secondary | ICD-10-CM

## 2014-06-02 DIAGNOSIS — I10 Essential (primary) hypertension: Secondary | ICD-10-CM

## 2014-06-02 MED ORDER — METOPROLOL TARTRATE 25 MG PO TABS: 25.0000 mg | ORAL_TABLET | Freq: Two times a day (BID) | ORAL | Status: DC

## 2014-06-02 NOTE — Patient Instructions (Addendum)
Your physician recommends that you schedule a follow-up appointment as needed.  Your physician recommends that you continue on your current medications as directed. Please refer to the Current Medication list given to you today.  Start: Lopressor 25mg  twice daily for one month.  Thank you for choosing [facility]!

## 2014-06-02 NOTE — Progress Notes (Signed)
HPI Patient is a 63 yo who is referred from ED for evaluation of palpitations  She was seen on 12/16 Patinet has had in past  A few years ago was evaluated at [facility]  Wore monitor  It did not show anything she says. About 4 to 5 wks ago palpitations came back  Felt flutter or "gurgle" in chest.  Became more frequent.  No dizzinss  Uncomfortable.  Breathing OK She is not that active for about 1 month  Not working    Went to ER on 12/ 16  EKg normal  Tele showed PACs more than PVCs  Correl with symptoms  Nothing was done and she was referred to cardiol  Since then she says that the palpitations have imprved.  Now seldom  She denies CP  No SOB  No syncope Allergies  Allergen Reactions   Codeine Nausea And Vomiting   Lisinopril     cough    Current Outpatient Prescriptions  Medication Sig Dispense Refill   ADVAIR DISKUS 100-50 MCG/DOSE AEPB INHALE ONE DOSE BY MOUTH EVERY 12 HOURS 60 each 4   albuterol (PROVENTIL HFA;VENTOLIN HFA) 108 (90 BASE) MCG/ACT inhaler Inhale 2 puffs into the lungs every 6 (six) hours as needed for wheezing.     amLODipine (NORVASC) 5 MG tablet Take 1 tablet (5 mg total) by mouth daily. 90 tablet 3   aspirin 81 MG tablet Take 81 mg by mouth daily.     calcium carbonate 1250 MG capsule Take 1,250 mg by mouth 2 (two) times daily with a meal.     cyclobenzaprine (FLEXERIL) 5 MG tablet Take 1 tablet (5 mg total) by mouth 3 (three) times daily as needed for muscle spasms. 30 tablet 5   ergocalciferol (VITAMIN D2) 50000 UNITS capsule Take 50,000 Units by mouth once a week.     estradiol (ESTRACE) 1 MG tablet Take 1 mg by mouth daily.     furosemide (LASIX) 40 MG tablet One po qd 2-3 days prn swelling 3 tablet 3   hydrochlorothiazide (HYDRODIURIL) 25 MG tablet Take 1 tablet (25 mg total) by mouth daily. 30 tablet 5   meloxicam (MOBIC) 15 MG tablet Take 1 tablet (15 mg total) by mouth daily. 30 tablet 5   SUMAtriptan (IMITREX) 6 MG/0.5ML SOLN injection Inject 6  mg into the skin every 2 (two) hours as needed for migraine or headache. F     vitamin B-12 (CYANOCOBALAMIN) 1000 MCG tablet Take 1,000 mcg by mouth daily.     vitamin E (VITAMIN E) 400 UNIT capsule Take 400 Units by mouth daily.     No current facility-administered medications for this visit.    Past Medical History  Diagnosis Date   Asthma    Migraines     cluster   Allergy    Hypertension     Past Surgical History  Procedure Laterality Date   Abdominal hysterectomy  1985   Ligament repair Right     birth defect    No family history on file.  History   Social History   Marital Status: Married    Spouse Name: N/A    Number of Children: N/A   Years of Education: N/A   Occupational History   Not on file.   Social History Main Topics   Smoking status: Never Smoker    Smokeless tobacco: Not on file   Alcohol Use: No   Drug Use: No   Sexual Activity: Yes   Other Topics Concern  Not on file   Social History Narrative    Review of Systems:  All systems reviewed.  They are negative to the above problem except as previously stated.  Vital Signs: BP 150/82 mmHg   Pulse 84   Ht 5\' 3"  (1.6 m)   Wt 201 lb 6.4 oz (91.354 kg)   BMI 35.69 kg/m2  Physical Exam Patinet is a morbidly obese 63 yo in NAD   HEENT:  Normocephalic, atraumatic. EOMI, PERRLA.  Neck: JVP is normal.  No bruits.  Lungs: clear to auscultation. No rales no wheezes.  Heart: Regular rate and rhythm. Normal S1, S2. No S3.   No significant murmurs. PMI not displaced.  Abdomen:  Supple, nontender. Normal bowel sounds. No masses. No hepatomegaly.  Extremities:   Good distal pulses throughout. No lower extremity edema.  Musculoskeletal :moving all extremities.  Neuro:   alert and oriented x3.  CN II-XII grossly intact.  EKG  12/16  SR  Assessment and Plan:  1.  Palpitations.  SYmptomatic in ER with PACs and PVCs  Has improved  Now seldom.  Exam normal I would follow  If increases  can get montior  I would call in metoprolol for bad spells  2.  HTN  Patient says BP up and down but usually better  Will need to be followed  3.  HCM  Will get lipds as well as monitor results from [facility].    F/U prn.  Encouraged her to lose wt  And increase her activity.

## 2014-06-29 ENCOUNTER — Other Ambulatory Visit: Payer: Self-pay | Admitting: Nurse Practitioner

## 2014-07-13 ENCOUNTER — Other Ambulatory Visit: Payer: Self-pay | Admitting: Family Medicine

## 2014-07-15 ENCOUNTER — Ambulatory Visit (INDEPENDENT_AMBULATORY_CARE_PROVIDER_SITE_OTHER): Payer: BLUE CROSS/BLUE SHIELD | Admitting: Family

## 2014-07-15 ENCOUNTER — Encounter: Payer: Self-pay | Admitting: Family

## 2014-07-15 VITALS — BP 114/65 | HR 99 | Temp 97.2°F | Ht 63.0 in | Wt 200.4 lb

## 2014-07-15 DIAGNOSIS — Z13220 Encounter for screening for lipoid disorders: Secondary | ICD-10-CM

## 2014-07-15 DIAGNOSIS — E559 Vitamin D deficiency, unspecified: Secondary | ICD-10-CM

## 2014-07-15 DIAGNOSIS — J4530 Mild persistent asthma, uncomplicated: Secondary | ICD-10-CM | POA: Insufficient documentation

## 2014-07-15 DIAGNOSIS — I10 Essential (primary) hypertension: Secondary | ICD-10-CM | POA: Insufficient documentation

## 2014-07-15 DIAGNOSIS — J4520 Mild intermittent asthma, uncomplicated: Secondary | ICD-10-CM

## 2014-07-15 MED ORDER — FLUTICASONE-SALMETEROL 100-50 MCG/DOSE IN AEPB: INHALATION_SPRAY | RESPIRATORY_TRACT | Status: DC

## 2014-07-15 NOTE — Progress Notes (Signed)
Subjective:    Patient ID: Traci Johnson, female    DOB: 01-03-1951, 64 y.o.   MRN: [id number]  Hypertension This is a chronic problem. The current episode started more than 1 year ago. The problem has been resolved since onset. The problem is controlled. Associated symptoms include palpitations. Pertinent negatives include no anxiety, blurred vision, headaches, peripheral edema or shortness of breath. Risk factors for coronary artery disease include obesity, post-menopausal state, sedentary lifestyle and dyslipidemia. Past treatments include ACE inhibitors and diuretics. The current treatment provides significant improvement. There is no history of kidney disease, CAD/MI, CVA, heart failure or a thyroid problem. There is no history of sleep apnea.  Asthma There is no cough, difficulty breathing, hemoptysis, shortness of breath or wheezing. This is a chronic problem. The current episode started more than 1 year ago. The problem occurs rarely. The problem has been resolved. Pertinent negatives include no headaches. Her symptoms are aggravated by any activity, change in weather and pollen. Her symptoms are alleviated by rest (Adviar- Pt states she does not have to use her albuterol inhaler once in the last  month). She reports complete improvement on treatment. Her past medical history is significant for asthma.   Pt is currently taking estrace for hot flashes for the last 10 years. Pt states she is does have a family history of breast cancer. Pt is aware of risks and states she has be tampering her dose.    Review of Systems  Constitutional: Negative.   HENT: Negative.   Eyes: Negative.  Negative for blurred vision.  Respiratory: Negative.  Negative for cough, hemoptysis, shortness of breath and wheezing.   Cardiovascular: Positive for palpitations.  Gastrointestinal: Negative.   Endocrine: Negative.   Genitourinary: Negative.   Musculoskeletal: Negative.   Neurological: Negative.  Negative  for headaches.  Hematological: Negative.   Psychiatric/Behavioral: Negative.   All other systems reviewed and are negative.      Objective:   Physical Exam  Constitutional: She is oriented to person, place, and time. She appears well-developed and well-nourished. No distress.  HENT:  Head: Normocephalic and atraumatic.  Right Ear: External ear normal.  Mouth/Throat: Oropharynx is clear and moist.  Eyes: Pupils are equal, round, and reactive to light.  Neck: Normal range of motion. Neck supple. No thyromegaly present.  Cardiovascular: Normal rate, regular rhythm, normal heart sounds and intact distal pulses.   No murmur heard. Pulmonary/Chest: Effort normal and breath sounds normal. No respiratory distress. She has no wheezes.  Abdominal: Soft. Bowel sounds are normal. She exhibits no distension. There is no tenderness.  Musculoskeletal: Normal range of motion. She exhibits no edema or tenderness.  Neurological: She is alert and oriented to person, place, and time. She has normal reflexes. No cranial nerve deficit.  Skin: Skin is warm and dry.  Psychiatric: She has a normal mood and affect. Her behavior is normal. Judgment and thought content normal.  Vitals reviewed.   BP 114/65 mmHg   Pulse 99   Temp(Src) 97.2 F (36.2 C) (Oral)   Ht 5' 3"  (1.6 m)   Wt 200 lb 6.4 oz (90.901 kg)   BMI 35.51 kg/m2       Assessment & Plan:  1. Essential hypertension - CMP14+EGFR  2. Asthma, chronic, mild intermittent, uncomplicated - URK27+CWCB - Fluticasone-Salmeterol (ADVAIR DISKUS) 100-50 MCG/DOSE AEPB; INHALE ONE DOSE BY MOUTH EVERY 12 HOURS  Dispense: 60 each; Refill: 11  3. Vitamin D deficiency - CMP14+EGFR - Vit D  25 hydroxy (rtn  osteoporosis monitoring)  4. Screening for hyperlipidemia - Lipid panel   Continue all meds Labs ordered- Pt ate right before visit- Will get labs drawn this weeks Health Maintenance reviewed Diet and exercise encouraged RTO 1 year  Evelina Dun,  FNP

## 2014-07-15 NOTE — Patient Instructions (Signed)

## 2014-08-04 ENCOUNTER — Other Ambulatory Visit: Payer: Self-pay | Admitting: Family Medicine

## 2014-08-06 ENCOUNTER — Other Ambulatory Visit: Payer: Self-pay | Admitting: Family Medicine

## 2014-09-24 ENCOUNTER — Other Ambulatory Visit: Payer: Self-pay | Admitting: Family Medicine

## 2014-12-29 ENCOUNTER — Encounter: Payer: Self-pay | Admitting: *Deleted

## 2014-12-29 ENCOUNTER — Other Ambulatory Visit: Payer: Self-pay | Admitting: Family

## 2015-02-05 ENCOUNTER — Other Ambulatory Visit (HOSPITAL_COMMUNITY): Payer: Self-pay | Admitting: Obstetrics and Gynecology

## 2015-02-05 DIAGNOSIS — Z1231 Encounter for screening mammogram for malignant neoplasm of breast: Secondary | ICD-10-CM

## 2015-02-17 ENCOUNTER — Ambulatory Visit (HOSPITAL_COMMUNITY)
Admission: RE | Admit: 2015-02-17 | Discharge: 2015-02-17 | Disposition: A | Discharge status: Home / Self Care | Payer: BLUE CROSS/BLUE SHIELD | Source: Ambulatory Visit | Attending: Obstetrics and Gynecology | Admitting: Obstetrics and Gynecology

## 2015-02-17 DIAGNOSIS — Z1231 Encounter for screening mammogram for malignant neoplasm of breast: Secondary | ICD-10-CM

## 2015-02-23 ENCOUNTER — Other Ambulatory Visit: Payer: Self-pay | Admitting: Obstetrics and Gynecology

## 2015-02-23 DIAGNOSIS — R928 Other abnormal and inconclusive findings on diagnostic imaging of breast: Secondary | ICD-10-CM

## 2015-03-02 ENCOUNTER — Ambulatory Visit (HOSPITAL_COMMUNITY)
Admission: RE | Admit: 2015-03-02 | Discharge: 2015-03-02 | Disposition: A | Discharge status: Home / Self Care | Payer: BLUE CROSS/BLUE SHIELD | Source: Ambulatory Visit | Attending: Obstetrics and Gynecology | Admitting: Obstetrics and Gynecology

## 2015-03-02 DIAGNOSIS — R928 Other abnormal and inconclusive findings on diagnostic imaging of breast: Secondary | ICD-10-CM

## 2015-03-02 DIAGNOSIS — N6489 Other specified disorders of breast: Secondary | ICD-10-CM | POA: Insufficient documentation

## 2015-03-04 ENCOUNTER — Other Ambulatory Visit: Payer: Self-pay | Admitting: Nurse Practitioner

## 2015-08-13 ENCOUNTER — Other Ambulatory Visit: Payer: Self-pay | Admitting: Family

## 2015-10-28 DIAGNOSIS — N951 Menopausal and female climacteric states: Secondary | ICD-10-CM | POA: Diagnosis not present

## 2015-10-28 DIAGNOSIS — Z01419 Encounter for gynecological examination (general) (routine) without abnormal findings: Secondary | ICD-10-CM | POA: Diagnosis not present

## 2015-10-28 DIAGNOSIS — Z6834 Body mass index (BMI) 34.0-34.9, adult: Secondary | ICD-10-CM | POA: Diagnosis not present

## 2015-12-13 DIAGNOSIS — I10 Essential (primary) hypertension: Secondary | ICD-10-CM | POA: Diagnosis not present

## 2015-12-13 DIAGNOSIS — E6609 Other obesity due to excess calories: Secondary | ICD-10-CM | POA: Diagnosis not present

## 2015-12-13 DIAGNOSIS — R7301 Impaired fasting glucose: Secondary | ICD-10-CM | POA: Diagnosis not present

## 2015-12-15 DIAGNOSIS — R7301 Impaired fasting glucose: Secondary | ICD-10-CM | POA: Diagnosis not present

## 2015-12-15 DIAGNOSIS — F5101 Primary insomnia: Secondary | ICD-10-CM | POA: Diagnosis not present

## 2015-12-15 DIAGNOSIS — J302 Other seasonal allergic rhinitis: Secondary | ICD-10-CM | POA: Diagnosis not present

## 2015-12-15 DIAGNOSIS — E782 Mixed hyperlipidemia: Secondary | ICD-10-CM | POA: Diagnosis not present

## 2015-12-20 DIAGNOSIS — I10 Essential (primary) hypertension: Secondary | ICD-10-CM | POA: Diagnosis not present

## 2016-01-17 DIAGNOSIS — I10 Essential (primary) hypertension: Secondary | ICD-10-CM | POA: Diagnosis not present

## 2016-01-17 DIAGNOSIS — E6609 Other obesity due to excess calories: Secondary | ICD-10-CM | POA: Diagnosis not present

## 2016-02-16 ENCOUNTER — Other Ambulatory Visit (HOSPITAL_COMMUNITY): Payer: Self-pay | Admitting: Internal Medicine

## 2016-02-16 DIAGNOSIS — Z1231 Encounter for screening mammogram for malignant neoplasm of breast: Secondary | ICD-10-CM

## 2016-02-22 DIAGNOSIS — R05 Cough: Secondary | ICD-10-CM | POA: Diagnosis not present

## 2016-02-22 DIAGNOSIS — J309 Allergic rhinitis, unspecified: Secondary | ICD-10-CM | POA: Diagnosis not present

## 2016-02-22 DIAGNOSIS — J060 Acute laryngopharyngitis: Secondary | ICD-10-CM | POA: Diagnosis not present

## 2016-02-22 DIAGNOSIS — M25562 Pain in left knee: Secondary | ICD-10-CM | POA: Diagnosis not present

## 2016-02-24 ENCOUNTER — Ambulatory Visit (HOSPITAL_COMMUNITY): Payer: BLUE CROSS/BLUE SHIELD

## 2016-03-15 ENCOUNTER — Ambulatory Visit (HOSPITAL_COMMUNITY)
Admission: RE | Admit: 2016-03-15 | Discharge: 2016-03-15 | Disposition: A | Discharge status: Home / Self Care | Payer: BLUE CROSS/BLUE SHIELD | Source: Ambulatory Visit | Attending: Internal Medicine | Admitting: Internal Medicine

## 2016-03-15 DIAGNOSIS — Z1231 Encounter for screening mammogram for malignant neoplasm of breast: Secondary | ICD-10-CM | POA: Diagnosis not present

## 2016-03-20 DIAGNOSIS — Z6832 Body mass index (BMI) 32.0-32.9, adult: Secondary | ICD-10-CM | POA: Diagnosis not present

## 2016-03-20 DIAGNOSIS — Z23 Encounter for immunization: Secondary | ICD-10-CM | POA: Diagnosis not present

## 2016-03-20 DIAGNOSIS — E6609 Other obesity due to excess calories: Secondary | ICD-10-CM | POA: Diagnosis not present

## 2016-03-20 DIAGNOSIS — M25561 Pain in right knee: Secondary | ICD-10-CM | POA: Diagnosis not present

## 2016-03-20 DIAGNOSIS — M79604 Pain in right leg: Secondary | ICD-10-CM | POA: Diagnosis not present

## 2016-04-11 DIAGNOSIS — G8929 Other chronic pain: Secondary | ICD-10-CM | POA: Diagnosis not present

## 2016-04-11 DIAGNOSIS — M1711 Unilateral primary osteoarthritis, right knee: Secondary | ICD-10-CM | POA: Diagnosis not present

## 2016-04-11 DIAGNOSIS — M25561 Pain in right knee: Secondary | ICD-10-CM | POA: Diagnosis not present

## 2016-04-11 DIAGNOSIS — M25562 Pain in left knee: Secondary | ICD-10-CM | POA: Diagnosis not present

## 2016-05-18 DIAGNOSIS — M542 Cervicalgia: Secondary | ICD-10-CM | POA: Diagnosis not present

## 2016-06-16 DIAGNOSIS — I10 Essential (primary) hypertension: Secondary | ICD-10-CM | POA: Diagnosis not present

## 2016-06-16 DIAGNOSIS — E782 Mixed hyperlipidemia: Secondary | ICD-10-CM | POA: Diagnosis not present

## 2016-06-16 DIAGNOSIS — R7301 Impaired fasting glucose: Secondary | ICD-10-CM | POA: Diagnosis not present

## 2016-06-19 DIAGNOSIS — E782 Mixed hyperlipidemia: Secondary | ICD-10-CM | POA: Diagnosis not present

## 2016-06-19 DIAGNOSIS — I10 Essential (primary) hypertension: Secondary | ICD-10-CM | POA: Diagnosis not present

## 2016-06-19 DIAGNOSIS — Z0000 Encounter for general adult medical examination without abnormal findings: Secondary | ICD-10-CM | POA: Diagnosis not present

## 2016-06-19 DIAGNOSIS — R7301 Impaired fasting glucose: Secondary | ICD-10-CM | POA: Diagnosis not present

## 2016-08-20 DIAGNOSIS — M545 Low back pain: Secondary | ICD-10-CM | POA: Diagnosis not present

## 2016-08-25 DIAGNOSIS — M545 Low back pain: Secondary | ICD-10-CM | POA: Diagnosis not present

## 2016-08-28 DIAGNOSIS — M533 Sacrococcygeal disorders, not elsewhere classified: Secondary | ICD-10-CM | POA: Diagnosis not present

## 2016-09-05 DIAGNOSIS — M533 Sacrococcygeal disorders, not elsewhere classified: Secondary | ICD-10-CM | POA: Diagnosis not present

## 2016-10-25 DIAGNOSIS — Z86010 Personal history of colonic polyps: Secondary | ICD-10-CM | POA: Diagnosis not present

## 2016-11-02 DIAGNOSIS — Z01419 Encounter for gynecological examination (general) (routine) without abnormal findings: Secondary | ICD-10-CM | POA: Diagnosis not present

## 2016-11-02 DIAGNOSIS — Z6832 Body mass index (BMI) 32.0-32.9, adult: Secondary | ICD-10-CM | POA: Diagnosis not present

## 2016-11-02 DIAGNOSIS — N951 Menopausal and female climacteric states: Secondary | ICD-10-CM | POA: Diagnosis not present

## 2016-12-14 DIAGNOSIS — I10 Essential (primary) hypertension: Secondary | ICD-10-CM | POA: Diagnosis not present

## 2016-12-14 DIAGNOSIS — R7301 Impaired fasting glucose: Secondary | ICD-10-CM | POA: Diagnosis not present

## 2016-12-14 DIAGNOSIS — Z1159 Encounter for screening for other viral diseases: Secondary | ICD-10-CM | POA: Diagnosis not present

## 2016-12-19 DIAGNOSIS — R7301 Impaired fasting glucose: Secondary | ICD-10-CM | POA: Diagnosis not present

## 2016-12-19 DIAGNOSIS — Z0000 Encounter for general adult medical examination without abnormal findings: Secondary | ICD-10-CM | POA: Diagnosis not present

## 2016-12-19 DIAGNOSIS — N951 Menopausal and female climacteric states: Secondary | ICD-10-CM | POA: Diagnosis not present

## 2016-12-19 DIAGNOSIS — E782 Mixed hyperlipidemia: Secondary | ICD-10-CM | POA: Diagnosis not present

## 2016-12-19 DIAGNOSIS — I10 Essential (primary) hypertension: Secondary | ICD-10-CM | POA: Diagnosis not present

## 2017-01-23 DIAGNOSIS — I10 Essential (primary) hypertension: Secondary | ICD-10-CM | POA: Diagnosis not present

## 2017-02-01 ENCOUNTER — Other Ambulatory Visit (HOSPITAL_COMMUNITY): Payer: Self-pay | Admitting: Internal Medicine

## 2017-02-01 DIAGNOSIS — Z1231 Encounter for screening mammogram for malignant neoplasm of breast: Secondary | ICD-10-CM

## 2017-03-21 DIAGNOSIS — K1370 Unspecified lesions of oral mucosa: Secondary | ICD-10-CM | POA: Diagnosis not present

## 2017-03-28 ENCOUNTER — Ambulatory Visit (HOSPITAL_COMMUNITY): Payer: BLUE CROSS/BLUE SHIELD

## 2017-04-25 ENCOUNTER — Ambulatory Visit (HOSPITAL_COMMUNITY): Payer: BLUE CROSS/BLUE SHIELD

## 2017-04-27 ENCOUNTER — Emergency Department (HOSPITAL_COMMUNITY): Payer: BLUE CROSS/BLUE SHIELD

## 2017-04-27 ENCOUNTER — Inpatient Hospital Stay (HOSPITAL_COMMUNITY)
Admission: EM | Admit: 2017-04-27 | Discharge: 2017-05-01 | DRG: 287 | Disposition: A | Discharge status: Home / Self Care | Payer: BLUE CROSS/BLUE SHIELD | Attending: Internal Medicine | Admitting: Internal Medicine

## 2017-04-27 ENCOUNTER — Encounter (HOSPITAL_COMMUNITY): Payer: Self-pay | Admitting: Emergency Medicine

## 2017-04-27 DIAGNOSIS — D72829 Elevated white blood cell count, unspecified: Secondary | ICD-10-CM | POA: Diagnosis present

## 2017-04-27 DIAGNOSIS — I10 Essential (primary) hypertension: Secondary | ICD-10-CM | POA: Diagnosis present

## 2017-04-27 DIAGNOSIS — Z885 Allergy status to narcotic agent status: Secondary | ICD-10-CM

## 2017-04-27 DIAGNOSIS — R072 Precordial pain: Secondary | ICD-10-CM | POA: Diagnosis not present

## 2017-04-27 DIAGNOSIS — R9439 Abnormal result of other cardiovascular function study: Secondary | ICD-10-CM | POA: Diagnosis not present

## 2017-04-27 DIAGNOSIS — E663 Overweight: Secondary | ICD-10-CM | POA: Diagnosis present

## 2017-04-27 DIAGNOSIS — J4530 Mild persistent asthma, uncomplicated: Secondary | ICD-10-CM | POA: Diagnosis present

## 2017-04-27 DIAGNOSIS — S40021A Contusion of right upper arm, initial encounter: Secondary | ICD-10-CM | POA: Diagnosis not present

## 2017-04-27 DIAGNOSIS — Z90710 Acquired absence of both cervix and uterus: Secondary | ICD-10-CM | POA: Diagnosis not present

## 2017-04-27 DIAGNOSIS — I951 Orthostatic hypotension: Secondary | ICD-10-CM | POA: Diagnosis not present

## 2017-04-27 DIAGNOSIS — Z791 Long term (current) use of non-steroidal anti-inflammatories (NSAID): Secondary | ICD-10-CM | POA: Diagnosis not present

## 2017-04-27 DIAGNOSIS — K580 Irritable bowel syndrome with diarrhea: Secondary | ICD-10-CM | POA: Diagnosis not present

## 2017-04-27 DIAGNOSIS — R079 Chest pain, unspecified: Secondary | ICD-10-CM | POA: Diagnosis not present

## 2017-04-27 DIAGNOSIS — Z79899 Other long term (current) drug therapy: Secondary | ICD-10-CM

## 2017-04-27 DIAGNOSIS — Z888 Allergy status to other drugs, medicaments and biological substances status: Secondary | ICD-10-CM | POA: Diagnosis not present

## 2017-04-27 DIAGNOSIS — Z23 Encounter for immunization: Secondary | ICD-10-CM

## 2017-04-27 DIAGNOSIS — Z6832 Body mass index (BMI) 32.0-32.9, adult: Secondary | ICD-10-CM | POA: Diagnosis not present

## 2017-04-27 DIAGNOSIS — G43909 Migraine, unspecified, not intractable, without status migrainosus: Secondary | ICD-10-CM | POA: Diagnosis not present

## 2017-04-27 DIAGNOSIS — K8010 Calculus of gallbladder with chronic cholecystitis without obstruction: Secondary | ICD-10-CM | POA: Diagnosis not present

## 2017-04-27 DIAGNOSIS — J45909 Unspecified asthma, uncomplicated: Secondary | ICD-10-CM | POA: Diagnosis present

## 2017-04-27 DIAGNOSIS — R51 Headache: Secondary | ICD-10-CM | POA: Diagnosis not present

## 2017-04-27 DIAGNOSIS — E876 Hypokalemia: Secondary | ICD-10-CM | POA: Diagnosis not present

## 2017-04-27 DIAGNOSIS — R740 Nonspecific elevation of levels of transaminase and lactic acid dehydrogenase [LDH]: Secondary | ICD-10-CM | POA: Diagnosis not present

## 2017-04-27 DIAGNOSIS — K8020 Calculus of gallbladder without cholecystitis without obstruction: Secondary | ICD-10-CM | POA: Diagnosis not present

## 2017-04-27 DIAGNOSIS — R002 Palpitations: Secondary | ICD-10-CM | POA: Diagnosis not present

## 2017-04-27 DIAGNOSIS — D649 Anemia, unspecified: Secondary | ICD-10-CM | POA: Diagnosis present

## 2017-04-27 DIAGNOSIS — Z7982 Long term (current) use of aspirin: Secondary | ICD-10-CM

## 2017-04-27 DIAGNOSIS — R0789 Other chest pain: Secondary | ICD-10-CM | POA: Diagnosis not present

## 2017-04-27 DIAGNOSIS — R739 Hyperglycemia, unspecified: Secondary | ICD-10-CM | POA: Diagnosis not present

## 2017-04-27 DIAGNOSIS — I9581 Postprocedural hypotension: Secondary | ICD-10-CM | POA: Diagnosis not present

## 2017-04-27 DIAGNOSIS — Z6831 Body mass index (BMI) 31.0-31.9, adult: Secondary | ICD-10-CM | POA: Diagnosis not present

## 2017-04-27 DIAGNOSIS — D539 Nutritional anemia, unspecified: Secondary | ICD-10-CM | POA: Diagnosis not present

## 2017-04-27 DIAGNOSIS — X58XXXA Exposure to other specified factors, initial encounter: Secondary | ICD-10-CM | POA: Diagnosis not present

## 2017-04-27 DIAGNOSIS — K449 Diaphragmatic hernia without obstruction or gangrene: Secondary | ICD-10-CM | POA: Diagnosis not present

## 2017-04-27 DIAGNOSIS — I200 Unstable angina: Secondary | ICD-10-CM | POA: Diagnosis not present

## 2017-04-27 DIAGNOSIS — R748 Abnormal levels of other serum enzymes: Secondary | ICD-10-CM | POA: Diagnosis not present

## 2017-04-27 DIAGNOSIS — Y92239 Unspecified place in hospital as the place of occurrence of the external cause: Secondary | ICD-10-CM | POA: Diagnosis not present

## 2017-04-27 DIAGNOSIS — R55 Syncope and collapse: Secondary | ICD-10-CM | POA: Diagnosis not present

## 2017-04-27 LAB — CBC
HEMATOCRIT: 35.4 % — AB (ref 36.0–46.0)
Hemoglobin: 11.7 g/dL — ABNORMAL LOW (ref 12.0–15.0)
MCH: 28.9 pg (ref 26.0–34.0)
MCHC: 33.1 g/dL (ref 30.0–36.0)
MCV: 87.4 fL (ref 78.0–100.0)
PLATELETS: 300 10*3/uL (ref 150–400)
RBC: 4.05 MIL/uL (ref 3.87–5.11)
RDW: 12.9 % (ref 11.5–15.5)
WBC: 12.3 10*3/uL — AB (ref 4.0–10.5)

## 2017-04-27 LAB — BASIC METABOLIC PANEL
Anion gap: 7 (ref 5–15)
BUN: 16 mg/dL (ref 6–20)
CHLORIDE: 103 mmol/L (ref 101–111)
CO2: 26 mmol/L (ref 22–32)
CREATININE: 0.82 mg/dL (ref 0.44–1.00)
Calcium: 9.2 mg/dL (ref 8.9–10.3)
GFR calc Af Amer: >60 mL/min (ref >60)
GFR calc non Af Amer: >60 mL/min (ref >60)
Glucose, Bld: 139 mg/dL — ABNORMAL HIGH (ref 65–99)
POTASSIUM: 3.3 mmol/L — AB (ref 3.5–5.1)
SODIUM: 136 mmol/L (ref 135–145)

## 2017-04-27 LAB — I-STAT TROPONIN, ED: TROPONIN I, POC: 0 ng/mL (ref 0.00–0.08)

## 2017-04-27 MED ORDER — POTASSIUM CHLORIDE CRYS ER 20 MEQ PO TBCR
40.0000 meq | EXTENDED_RELEASE_TABLET | Freq: Once | ORAL | Status: AC
  Administered 2017-04-28: 40 meq via ORAL
  Filled 2017-04-27: qty 2

## 2017-04-27 MED ORDER — ALBUTEROL SULFATE (2.5 MG/3ML) 0.083% IN NEBU
3.0000 mL | INHALATION_SOLUTION | Freq: Four times a day (QID) | RESPIRATORY_TRACT | Status: DC | PRN
  Filled 2017-04-27: qty 3

## 2017-04-27 MED ORDER — CYCLOBENZAPRINE HCL 10 MG PO TABS: 5.0000 mg | ORAL_TABLET | Freq: Three times a day (TID) | ORAL | Status: DC | PRN

## 2017-04-27 MED ORDER — ONDANSETRON HCL 4 MG/2ML IJ SOLN
4.0000 mg | Freq: Four times a day (QID) | INTRAMUSCULAR | Status: DC | PRN
Start: 2017-04-27 — End: 2017-04-30

## 2017-04-27 MED ORDER — HYDROCHLOROTHIAZIDE 25 MG PO TABS
25.0000 mg | ORAL_TABLET | Freq: Every day | ORAL | Status: DC
  Administered 2017-04-28 – 2017-04-30 (×3): 25 mg via ORAL
  Filled 2017-04-27 (×3): qty 1

## 2017-04-27 MED ORDER — GI COCKTAIL ~~LOC~~: 30.0000 mL | Freq: Four times a day (QID) | ORAL | Status: DC | PRN

## 2017-04-27 MED ORDER — MOMETASONE FURO-FORMOTEROL FUM 100-5 MCG/ACT IN AERO
2.0000 | INHALATION_SPRAY | Freq: Two times a day (BID) | RESPIRATORY_TRACT | Status: DC
  Administered 2017-04-28 – 2017-04-30 (×6): 2 via RESPIRATORY_TRACT
  Filled 2017-04-27: qty 8.8

## 2017-04-27 MED ORDER — MORPHINE SULFATE (PF) 2 MG/ML IV SOLN
2.0000 mg | INTRAVENOUS | Status: DC | PRN
  Administered 2017-04-29 (×2): 2 mg via INTRAVENOUS
  Filled 2017-04-27 (×2): qty 1

## 2017-04-27 MED ORDER — ASPIRIN 81 MG PO CHEW
81.0000 mg | CHEWABLE_TABLET | Freq: Every day | ORAL | Status: DC
  Administered 2017-04-28 – 2017-04-30 (×3): 81 mg via ORAL
  Filled 2017-04-27 (×4): qty 1

## 2017-04-27 MED ORDER — ACETAMINOPHEN 325 MG PO TABS
650.0000 mg | ORAL_TABLET | ORAL | Status: DC | PRN
  Administered 2017-04-29: 650 mg via ORAL
  Filled 2017-04-27: qty 2

## 2017-04-27 MED ORDER — ENOXAPARIN SODIUM 40 MG/0.4ML ~~LOC~~ SOLN
40.0000 mg | Freq: Every day | SUBCUTANEOUS | Status: DC
  Administered 2017-04-28 – 2017-04-29 (×3): 40 mg via SUBCUTANEOUS
  Filled 2017-04-27 (×3): qty 0.4

## 2017-04-27 MED ORDER — AMLODIPINE BESYLATE 5 MG PO TABS
5.0000 mg | ORAL_TABLET | Freq: Every day | ORAL | Status: DC
  Administered 2017-04-28 – 2017-04-30 (×3): 5 mg via ORAL
  Filled 2017-04-27 (×3): qty 1

## 2017-04-27 MED ORDER — NITROGLYCERIN 0.4 MG SL SUBL
0.4000 mg | SUBLINGUAL_TABLET | SUBLINGUAL | Status: DC | PRN
  Administered 2017-04-27: 0.4 mg via SUBLINGUAL
  Filled 2017-04-27: qty 1

## 2017-04-27 NOTE — ED Notes (Signed)
Pt called nurse to room to complain about blurry vision and light headedness. Pt BP taken. MD notified of hypotension

## 2017-04-27 NOTE — ED Provider Notes (Signed)
[facility] EMERGENCY DEPARTMENT Provider Note   CSN: [id number] Arrival date & time: 04/27/17  2032     History   Chief Complaint Chief Complaint  Patient presents with   Chest Pain    HPI Traci Johnson is a 66 y.o. female.  The history is provided by the patient and the spouse.  Chest Pain   This is a new problem. The current episode started 3 to 5 hours ago. The problem occurs constantly. The problem has been gradually improving. The pain is associated with rest. The pain is present in the substernal region. The pain is at a severity of 7/10. The quality of the pain is described as heavy and pressure-like. The pain radiates to the left shoulder and right shoulder. Duration of episode(s) is 4 hours. Associated symptoms include shortness of breath. Pertinent negatives include no abdominal pain, no back pain, no cough, no diaphoresis, no fever, no lower extremity edema, no nausea, no palpitations and no vomiting. She has tried nitroglycerin for the symptoms. The treatment provided moderate relief. Risk factors include obesity.  Her past medical history is significant for hypertension.  Pertinent negatives for past medical history include no seizures.  Pertinent negatives for family medical history include: no CAD.    Past Medical History:  Diagnosis Date   Allergy    Asthma    Hypertension    Migraines    cluster    Patient Active Problem List   Diagnosis Date Noted   Chest pain 04/27/2017   Hyperglycemia 04/27/2017   Anemia 04/27/2017   Essential hypertension 07/15/2014   Asthma, chronic 07/15/2014   Vitamin D deficiency 07/15/2014    Past Surgical History:  Procedure Laterality Date   ABDOMINAL HYSTERECTOMY  1985   LIGAMENT REPAIR Right    birth defect    OB History    No data available       Home Medications    Prior to Admission medications   Medication Sig Start Date End Date Taking? Authorizing Provider  albuterol  (PROVENTIL HFA;VENTOLIN HFA) 108 (90 BASE) MCG/ACT inhaler Inhale 2 puffs into the lungs every 6 (six) hours as needed for wheezing.   Yes [provider]  amLODipine (NORVASC) 5 MG tablet TAKE ONE TABLET BY MOUTH ONCE DAILY 03/05/15  Yes Hawks, Christy A, FNP  aspirin 81 MG tablet Take 81 mg by mouth daily.   Yes [provider]  cholecalciferol (VITAMIN D) 1000 units tablet Take 5,000 Units by mouth daily.   Yes [provider]  cyclobenzaprine (FLEXERIL) 5 MG tablet TAKE ONE TABLET BY MOUTH THREE TIMES DAILY AS NEEDED FOR MUSCLE SPASM 09/24/14  Yes Hawks, Christy A, FNP  estradiol (ESTRACE) 1 MG tablet Take 0.5 mg by mouth daily.    Yes [provider]  Fluticasone-Salmeterol (ADVAIR DISKUS) 100-50 MCG/DOSE AEPB INHALE ONE DOSE BY MOUTH EVERY 12 HOURS 07/15/14  Yes Evelina Dun A, FNP  hydrochlorothiazide (HYDRODIURIL) 25 MG tablet TAKE ONE TABLET BY MOUTH ONCE DAILY 12/29/14  Yes Evelina Dun A, FNP  meloxicam (MOBIC) 15 MG tablet TAKE ONE TABLET BY MOUTH ONCE DAILY 09/24/14  Yes Hawks, Christy A, FNP  phentermine 37.5 MG capsule Take 37.5 mg by mouth every morning.   Yes [provider]  SUMAtriptan (IMITREX) 6 MG/0.5ML SOLN injection Inject 6 mg into the skin every 2 (two) hours as needed for migraine or headache. F   Yes [provider]  vitamin B-12 (CYANOCOBALAMIN) 1000 MCG tablet Take 1,000 mcg by  mouth daily.   Yes [provider]  vitamin E (VITAMIN E) 400 UNIT capsule Take 400 Units by mouth daily.   Yes [provider]    Family History Family History  Problem Relation Age of Onset   Dementia Mother    Cancer Father    CAD Neg Hx     Social History Social History   Tobacco Use   Smoking status: Never Smoker   Smokeless tobacco: Never Used  Substance Use Topics   Alcohol use: Yes    Alcohol/week: 0.0 oz    Comment: rare   Drug use: No     Allergies   Codeine and Lisinopril   Review of  Systems Review of Systems  Constitutional: Negative for chills, diaphoresis and fever.  HENT: Negative for ear pain and sore throat.   Eyes: Negative for pain and visual disturbance.  Respiratory: Positive for shortness of breath. Negative for cough.   Cardiovascular: Positive for chest pain. Negative for palpitations.  Gastrointestinal: Negative for abdominal pain, nausea and vomiting.  Genitourinary: Negative for dysuria and hematuria.  Musculoskeletal: Negative for arthralgias and back pain.  Skin: Negative for color change and rash.  Neurological: Negative for seizures and syncope.  All other systems reviewed and are negative.    Physical Exam Updated Vital Signs BP 122/67    Pulse 82    Temp 97.6 F (36.4 C) (Oral)    Resp 17    Ht 5\' 3"  (1.6 m)    Wt 81.6 kg (180 lb)    SpO2 100%    BMI 31.89 kg/m   Physical Exam  Constitutional: She appears well-developed and well-nourished. No distress.  HENT:  Head: Normocephalic and atraumatic.  Eyes: Conjunctivae and EOM are normal. Pupils are equal, round, and reactive to light.  Neck: Neck supple.  Cardiovascular: Normal rate, regular rhythm, intact distal pulses and normal pulses. Exam reveals no gallop and no friction rub.  No murmur heard. Pulmonary/Chest: Effort normal and breath sounds normal. No respiratory distress. She has no decreased breath sounds.  Abdominal: Soft. She exhibits no distension. There is no tenderness.  Musculoskeletal: Normal range of motion. She exhibits no edema.       Right lower leg: She exhibits no edema.       Left lower leg: She exhibits no edema.  Neurological: She is alert.  Skin: Skin is warm and dry. Capillary refill takes less than 2 seconds.  Psychiatric: She has a normal mood and affect.  Nursing note and vitals reviewed.    ED Treatments / Results  Labs (all labs ordered are listed, but only abnormal results are displayed) Labs Reviewed  BASIC METABOLIC PANEL - Abnormal; Notable for  the following components:      Result Value   Potassium 3.3 (*)    Glucose, Bld 139 (*)    All other components within normal limits  CBC - Abnormal; Notable for the following components:   WBC 12.3 (*)    Hemoglobin 11.7 (*)    HCT 35.4 (*)    All other components within normal limits  HEMOGLOBIN A1C  LIPID PANEL  TSH  I-STAT TROPONIN, ED    EKG  EKG Interpretation  Date/Time:  Friday April 27 2017 20:40:46 EST Ventricular Rate:  93 PR Interval:    QRS Duration: 80 QT Interval:  356 QTC Calculation: 443 R Axis:   -35 Text Interpretation:  Sinus rhythm Low voltage, precordial leads LVH with secondary repolarization abnormality Anterior Q waves, possibly  due to LVH Confirmed by Lacretia Leigh ([id number]) on 04/27/2017 9:46:46 PM       Radiology Dg Chest 2 View  Result Date: 04/27/2017 CLINICAL DATA:  66 year old female with chest pain and shortness of breath. EXAM: CHEST  2 VIEW COMPARISON:  None. FINDINGS: The heart size and mediastinal contours are within normal limits. Mild left basilar atelectasis.  Both lungs are otherwise clear. The visualized skeletal structures are unremarkable. IMPRESSION: No active cardiopulmonary disease. Electronically Signed   By: Kristopher Oppenheim M.D.   On: 04/27/2017 21:11    Procedures Procedures (including critical care time)  Medications Ordered in ED Medications  nitroGLYCERIN (NITROSTAT) SL tablet 0.4 mg (0.4 mg Sublingual Given 04/27/17 2200)  potassium chloride SA (K-DUR,KLOR-CON) CR tablet 40 mEq (not administered)     Initial Impression / Assessment and Plan / ED Course  I have reviewed the triage vital signs and the nursing notes.  Pertinent labs & imaging results that were available during my care of the patient were reviewed by me and considered in my medical decision making (see chart for details).     Pt is a 66 y.o. female with pertinent PMHx of HTN who presents with CP as described above.  ED ECG showing NSR with no  ischemic changes or dysrhythmia.   HEART score 5  Concern for ACS.    Because of the age and risk factors of the patient, ACS will be ruled out with x2 serial troponins. Patient placed on telemetry. ASA 325mg  given at [facility] and Nitro given which completely resolved her pain. She did become hypotensive after the nitro that resolved with a fluid bolus. HDS now. CXR ordered. EKG performed and personally reviewed by myself which showed NSR with rate of 93 and no ST elevation, ST/T wave changes, or signs of dysrhythmias. EKG reviewed by myself and the attending.  Trop negative.  Unlikely PNA as CXR unremarkable, only mild leukocytosis, no cough, no fever. Unlikely PE as atypical presentation, low risk per [facility], doubt aortic dissection, pancreatitis, arrhythmia, pneumothorax, endo/myo/pericarditis, shingles, emergent complications of an ulcer, esophageal pathology, or other emergent pathology.  Labs and imaging reviewed by myself and considered in medical decision making if ordered. Imaging interpreted by radiology.  Considering the patient's history, patient admitted to the hospitalist service for continued work-up and ACS rule out.  Pt care discussed with and followed by my attending, Dr. Zenia Resides   Final Clinical Impressions(s) / ED Diagnoses   Final diagnoses:  Chest pain, unspecified type    ED Discharge Orders    None       Levert Heslop [place], MD 04/27/17 2317

## 2017-04-27 NOTE — H&P (Signed)
History and Physical    Traci Johnson UXN:[id number] DOB: 06/30/1950 DOA: 04/27/2017  PCP: Celene Squibb, MD Consultants:  None Patient coming from:  Home - lives with husband; NOKPandora Leiter, [phone]  Chief Complaint: chest pain  HPI: Traci Johnson is a 66 y.o. female with medical history significant of asthma and HTN presenting with acute onset of chest pain.  It felt like she had an elephant sitting on her chest with stabbing pains in her back.  Symptoms started about 6:30 pm.  She was sitting on the sofa.  It "came on with a bang" and they went straight to the urgent care in [facility] and they sent her here by ambulance.  The pain finally resolved with the 4th NTG pill - it did get better with the other NTG in that the pain was gone but she still had a lot of pressure.  The 4th pill resolved the pressure but also bottomed her BP out.  No associated symptoms.  No h/o chest pain, never had a stress test.   ED Course:  Patient with CP, h/o HTN.  Pain resolved with NTG.  Negative troponin, EKG and CXR unremarkable.  HEART score is 5.  Given ASA at urgent care.  Review of Systems: As per HPI; otherwise review of systems reviewed and negative.   Ambulatory Status:  Ambulates without assistance  Past Medical History:  Diagnosis Date   Allergy    Asthma    Hypertension    Migraines    cluster    Past Surgical History:  Procedure Laterality Date   ABDOMINAL HYSTERECTOMY  1985   LIGAMENT REPAIR Right    birth defect    Social History   Socioeconomic History   Marital status: Married    Spouse name: Not on file   Number of children: Not on file   Years of education: Not on file   Highest education level: Not on file  Social Needs   Financial resource strain: Not on file   Food insecurity - worry: Not on file   Food insecurity - inability: Not on file   Transportation needs - medical: Not on file   Transportation needs - non-medical: Not on file  Occupational  History   Occupation: Solicitor  Tobacco Use   Smoking status: Never Smoker   Smokeless tobacco: Never Used  Substance and Sexual Activity   Alcohol use: Yes    Alcohol/week: 0.0 oz    Comment: rare   Drug use: No   Sexual activity: Yes  Other Topics Concern   Not on file  Social History Narrative   Not on file    Allergies  Allergen Reactions   Codeine Nausea And Vomiting   Lisinopril     cough    Family History  Problem Relation Age of Onset   Dementia Mother    Cancer Father    CAD Neg Hx     Prior to Admission medications   Medication Sig Start Date End Date Taking? Authorizing Provider  albuterol (PROVENTIL HFA;VENTOLIN HFA) 108 (90 BASE) MCG/ACT inhaler Inhale 2 puffs into the lungs every 6 (six) hours as needed for wheezing.   Yes [provider]  amLODipine (NORVASC) 5 MG tablet TAKE ONE TABLET BY MOUTH ONCE DAILY 03/05/15  Yes Hawks, Christy A, FNP  aspirin 81 MG tablet Take 81 mg by mouth daily.   Yes [provider]  cholecalciferol (VITAMIN D) 1000 units tablet Take 5,000 Units by mouth daily.   Yes  [provider]  cyclobenzaprine (FLEXERIL) 5 MG tablet TAKE ONE TABLET BY MOUTH THREE TIMES DAILY AS NEEDED FOR MUSCLE SPASM 09/24/14  Yes Hawks, Christy A, FNP  estradiol (ESTRACE) 1 MG tablet Take 0.5 mg by mouth daily.    Yes [provider]  Fluticasone-Salmeterol (ADVAIR DISKUS) 100-50 MCG/DOSE AEPB INHALE ONE DOSE BY MOUTH EVERY 12 HOURS 07/15/14  Yes Evelina Dun A, FNP  hydrochlorothiazide (HYDRODIURIL) 25 MG tablet TAKE ONE TABLET BY MOUTH ONCE DAILY 12/29/14  Yes Evelina Dun A, FNP  meloxicam (MOBIC) 15 MG tablet TAKE ONE TABLET BY MOUTH ONCE DAILY 09/24/14  Yes Hawks, Christy A, FNP  phentermine 37.5 MG capsule Take 37.5 mg by mouth every morning.   Yes [provider]  SUMAtriptan (IMITREX) 6 MG/0.5ML SOLN injection Inject 6 mg into the skin every 2 (two) hours as needed for migraine or  headache. F   Yes [provider]  vitamin B-12 (CYANOCOBALAMIN) 1000 MCG tablet Take 1,000 mcg by mouth daily.   Yes [provider]  vitamin E (VITAMIN E) 400 UNIT capsule Take 400 Units by mouth daily.   Yes [provider]    Physical Exam: Vitals:   04/27/17 2200 04/27/17 2206 04/27/17 2215 04/27/17 2230  BP: 127/62 (!) 69/47 114/68 121/69  Pulse: 84 86 81 78  Resp: 17 15 (!) 9 16  Temp:      TempSrc:      SpO2: 97% 96% 91% 98%  Weight:      Height:         General:  Appears calm and comfortable and is NAD Eyes:  PERRL, EOMI, normal lids, iris ENT:  grossly normal hearing, lips & tongue, mmm Neck:  no LAD, masses or thyromegaly; no carotid bruits Cardiovascular:  RRR, no m/r/g. No LE edema.  Respiratory:   CTA bilaterally with no wheezes/rales/rhonchi.  Normal respiratory effort. Abdomen:  soft, NT, ND, NABS Skin:  no rash or induration seen on limited exam Musculoskeletal:  grossly normal tone BUE/BLE, good ROM, no bony abnormality Psychiatric:  grossly normal mood and affect, speech fluent and appropriate, AOx3 Neurologic:  CN 2-12 grossly intact, moves all extremities in coordinated fashion, sensation intact    Radiological Exams on Admission: Dg Chest 2 View  Result Date: 04/27/2017 CLINICAL DATA:  66 year old female with chest pain and shortness of breath. EXAM: CHEST  2 VIEW COMPARISON:  None. FINDINGS: The heart size and mediastinal contours are within normal limits. Mild left basilar atelectasis.  Both lungs are otherwise clear. The visualized skeletal structures are unremarkable. IMPRESSION: No active cardiopulmonary disease. Electronically Signed   By: Kristopher Oppenheim M.D.   On: 04/27/2017 21:11    EKG: Independently reviewed.  NSR with rate 93; low voltage, LVH, no evidence of acute ischemia   Labs on Admission: I have personally reviewed the available labs and imaging studies at the time of the admission.  Pertinent labs:   K+  3.3 Glucose 139 WBC 12.3 Hgb 11.7; 13.5 in 12/15 Troponin 0.00   Assessment/Plan Principal Problem:   Chest pain Active Problems:   Essential hypertension   Asthma, chronic   Hyperglycemia   Anemia   Chest pain -Patient with substernal chest pressure that came on at rest tonight acutely and resolved with NTG. -2/3 typical symptoms suggestive of atypical cardiac chest pain.  -CXR unremarkable.   -Initial cardiac troponin negative.  -EKG not indicative of acute ischemia.   -GRACE score is 108; which predicts an in-hospital death rate of  1%.  -Will plan to place in observation status on telemetry to rule out ACS by overnight observation.  -cycle troponin q6h x 3 and repeat EKG in AM -Continue ASA 81 mg  daily -morphine given -Risk factor stratification with HgbA1c and FLP; will also check TSH  -Cardiology consultation in AM - NPO for possible stress test  -Will plan to start Heparin drip if enzymes are positive and/or chest pain recurs -Hold Estradiol, phentermine, and Mobic, as all potentially increase her CVD risk  HTN -Takes Norvasc and HCTZ at home -Patient with hypotensive episode while in the ER after receiving her 4th dose of NTG  Hyperglycemia -Glucose 139 -Will check fasting glucose on AM labs as well as A1c -There is no indication to start medication at this time  Asthma -Continue home Albuterol and Advair (formulary substitution for Dulera)  Anemia -Uncertain baseline -Appears to be normocytic -Will follow with CBC   DVT prophylaxis:  Lovenox  Code Status: Full - confirmed with patient/family Family Communication: Husband and son were present throughout evaluation  Disposition Plan:  Home once clinically improved Consults called: Cardiology via Angie message  Admission status: It is my clinical opinion that referral for OBSERVATION is reasonable and necessary in this patient based on the above information provided. The aforementioned taken together  are felt to place the patient at high risk for further clinical deterioration. However it is anticipated that the patient may be medically stable for discharge from the hospital within 24 to 48 hours.    Karmen Bongo MD [facility]  If note is complete, please contact covering daytime or nighttime physician. [email or web address] Password [facility]  04/27/2017, 10:59 PM

## 2017-04-27 NOTE — ED Triage Notes (Signed)
Per EMS: Pt from UC w/ CP and SOB. Started at [place] w/ 7/10 pain. 324 of aspirin given at [facility]. Substernal pain w/ radiation to the back. Given 3 nitros w/ EMS. Pain 4/10 in triage. Back pain has resolved.  BP: 108/54 P: 110

## 2017-04-27 NOTE — ED Notes (Signed)
Nurse currently drawing labs.

## 2017-04-27 NOTE — ED Provider Notes (Signed)
I saw and evaluated the patient, reviewed the resident's note and I agree with the findings and plan.   EKG Interpretation  Date/Time:  Friday April 27 2017 20:40:46 EST Ventricular Rate:  93 PR Interval:    QRS Duration: 80 QT Interval:  356 QTC Calculation: 443 R Axis:   -35 Text Interpretation:  Sinus rhythm Low voltage, precordial leads LVH with secondary repolarization abnormality Anterior Q waves, possibly due to LVH Confirmed by Lacretia Leigh ([id number]) on 04/27/2017 9:46:32 PM      66 year old female who presents with chest pressure times 1 day.  Was seen in urgent care and sent in for eval.  Pain improved with nitroglycerin.  EKG without acute ischemic changes.  First troponin negative.  Will admit to the medicine service   Lacretia Leigh, MD 04/27/17 2217

## 2017-04-28 ENCOUNTER — Observation Stay (HOSPITAL_COMMUNITY): Payer: BLUE CROSS/BLUE SHIELD

## 2017-04-28 ENCOUNTER — Other Ambulatory Visit: Payer: Self-pay

## 2017-04-28 ENCOUNTER — Encounter (HOSPITAL_COMMUNITY): Payer: Self-pay | Admitting: *Deleted

## 2017-04-28 DIAGNOSIS — R079 Chest pain, unspecified: Secondary | ICD-10-CM | POA: Diagnosis not present

## 2017-04-28 DIAGNOSIS — Z23 Encounter for immunization: Secondary | ICD-10-CM | POA: Diagnosis not present

## 2017-04-28 DIAGNOSIS — I10 Essential (primary) hypertension: Secondary | ICD-10-CM

## 2017-04-28 DIAGNOSIS — I9581 Postprocedural hypotension: Secondary | ICD-10-CM | POA: Diagnosis not present

## 2017-04-28 DIAGNOSIS — K8020 Calculus of gallbladder without cholecystitis without obstruction: Secondary | ICD-10-CM | POA: Diagnosis not present

## 2017-04-28 DIAGNOSIS — R072 Precordial pain: Secondary | ICD-10-CM | POA: Diagnosis not present

## 2017-04-28 DIAGNOSIS — J45909 Unspecified asthma, uncomplicated: Secondary | ICD-10-CM | POA: Diagnosis not present

## 2017-04-28 DIAGNOSIS — R9439 Abnormal result of other cardiovascular function study: Secondary | ICD-10-CM | POA: Diagnosis not present

## 2017-04-28 DIAGNOSIS — I200 Unstable angina: Secondary | ICD-10-CM | POA: Diagnosis not present

## 2017-04-28 DIAGNOSIS — R002 Palpitations: Secondary | ICD-10-CM | POA: Diagnosis not present

## 2017-04-28 LAB — CBC
HEMATOCRIT: 38.8 % (ref 36.0–46.0)
HEMOGLOBIN: 12.6 g/dL (ref 12.0–15.0)
MCH: 28.8 pg (ref 26.0–34.0)
MCHC: 32.5 g/dL (ref 30.0–36.0)
MCV: 88.8 fL (ref 78.0–100.0)
Platelets: 312 10*3/uL (ref 150–400)
RBC: 4.37 MIL/uL (ref 3.87–5.11)
RDW: 13.4 % (ref 11.5–15.5)
WBC: 10 10*3/uL (ref 4.0–10.5)

## 2017-04-28 LAB — NM MYOCAR MULTI W/SPECT W/WALL MOTION / EF
CSEPEDS: 0 s
CSEPEW: 1 METS
CSEPHR: 67 %
CSEPPHR: 104 {beats}/min
Exercise duration (min): 0 min
MPHR: 155 {beats}/min
Rest HR: 81 {beats}/min

## 2017-04-28 LAB — LIPID PANEL
CHOL/HDL RATIO: 2.5 ratio
Cholesterol: 169 mg/dL (ref 0–200)
HDL: 67 mg/dL (ref >40)
LDL CALC: 72 mg/dL (ref 0–99)
TRIGLYCERIDES: 148 mg/dL (ref <150)
VLDL: 30 mg/dL (ref 0–40)

## 2017-04-28 LAB — HEPATIC FUNCTION PANEL
ALBUMIN: 3.2 g/dL — AB (ref 3.5–5.0)
ALK PHOS: 108 U/L (ref 38–126)
ALT: 67 U/L — AB (ref 14–54)
AST: 67 U/L — ABNORMAL HIGH (ref 15–41)
BILIRUBIN TOTAL: 0.8 mg/dL (ref 0.3–1.2)
Bilirubin, Direct: <0.1 mg/dL — ABNORMAL LOW (ref 0.1–0.5)
TOTAL PROTEIN: 6.3 g/dL — AB (ref 6.5–8.1)

## 2017-04-28 LAB — BASIC METABOLIC PANEL
Anion gap: 7 (ref 5–15)
BUN: 10 mg/dL (ref 6–20)
CHLORIDE: 107 mmol/L (ref 101–111)
CO2: 23 mmol/L (ref 22–32)
CREATININE: 0.54 mg/dL (ref 0.44–1.00)
Calcium: 9.4 mg/dL (ref 8.9–10.3)
GFR calc non Af Amer: >60 mL/min (ref >60)
Glucose, Bld: 118 mg/dL — ABNORMAL HIGH (ref 65–99)
POTASSIUM: 3.4 mmol/L — AB (ref 3.5–5.1)
Sodium: 137 mmol/L (ref 135–145)

## 2017-04-28 LAB — TSH: TSH: 2.028 u[IU]/mL (ref 0.350–4.500)

## 2017-04-28 LAB — LIPASE, BLOOD: LIPASE: 35 U/L (ref 11–51)

## 2017-04-28 LAB — HIV ANTIBODY (ROUTINE TESTING W REFLEX): HIV Screen 4th Generation wRfx: NONREACTIVE

## 2017-04-28 LAB — TROPONIN I
Troponin I: <0.03 ng/mL (ref <0.03)
Troponin I: <0.03 ng/mL (ref <0.03)
Troponin I: <0.03 ng/mL (ref <0.03)

## 2017-04-28 MED ORDER — TECHNETIUM TC 99M TETROFOSMIN IV KIT
30.0000 | PACK | Freq: Once | INTRAVENOUS | Status: AC | PRN
  Administered 2017-04-28: 30 via INTRAVENOUS

## 2017-04-28 MED ORDER — DIPHENHYDRAMINE HCL 25 MG PO CAPS: 50.0000 mg | ORAL_CAPSULE | Freq: Every evening | ORAL | Status: DC | PRN

## 2017-04-28 MED ORDER — REGADENOSON 0.4 MG/5ML IV SOLN
INTRAVENOUS | Status: AC
  Filled 2017-04-28: qty 5

## 2017-04-28 MED ORDER — PNEUMOCOCCAL VAC POLYVALENT 25 MCG/0.5ML IJ INJ
0.5000 mL | INJECTION | INTRAMUSCULAR | Status: AC
  Administered 2017-05-01: 0.5 mL via INTRAMUSCULAR
  Filled 2017-04-28 (×2): qty 0.5

## 2017-04-28 MED ORDER — TECHNETIUM TC 99M TETROFOSMIN IV KIT
10.0000 | PACK | Freq: Once | INTRAVENOUS | Status: AC | PRN
  Administered 2017-04-28: 10 via INTRAVENOUS

## 2017-04-28 MED ORDER — DIPHENHYDRAMINE HCL 25 MG PO CAPS
25.0000 mg | ORAL_CAPSULE | Freq: Every evening | ORAL | Status: DC | PRN
  Administered 2017-04-28 – 2017-04-29 (×2): 25 mg via ORAL
  Filled 2017-04-28 (×2): qty 1

## 2017-04-28 MED ORDER — REGADENOSON 0.4 MG/5ML IV SOLN
0.4000 mg | Freq: Once | INTRAVENOUS | Status: AC
  Administered 2017-04-28: 0.4 mg via INTRAVENOUS

## 2017-04-28 MED ORDER — POTASSIUM CHLORIDE CRYS ER 20 MEQ PO TBCR
40.0000 meq | EXTENDED_RELEASE_TABLET | Freq: Once | ORAL | Status: AC
  Administered 2017-04-28: 40 meq via ORAL
  Filled 2017-04-28: qty 2

## 2017-04-28 NOTE — Consult Note (Signed)
Cardiology Consultation:   Patient ID: Traci Johnson; [id number]; 22-May-1951   Admit date: 04/27/2017 Date of Consult: 04/28/2017  Primary Care Provider: Celene Squibb, MD Primary Cardiologist: none    Patient Profile:   Traci Johnson is a 66 y.o. female with a hx of hypertension and asthma who is being seen today for the evaluation of chest pain at the request of Dr. Lorin Mercy.  History of Present Illness:   Traci Johnson is a 66 year old woman with a past medical history significant for hypertension and asthma.  She had been cleaning out a garage and lifting boxes for most of the day.  She came home and sat on her couch and had a cookie.  She then developed retrosternal chest pains radiating to her back.  She described them as "shooting pains".  She said "it felt like an elephant was sitting on my chest".  She denies any similar prior symptoms.  She denies associated shortness of breath and diaphoresis.  She then went to an urgent care in [place].  The symptoms lasted until she was in the ambulance when they gave her nitroglycerin.  They again gave her nitroglycerin in the ED and she became hypotensive.  This morning she denies chest pain but says she feels weak and fatigued.  Troponins have been normal.  ECG which I personally interpreted demonstrated sinus rhythm without any acute ischemic ST segment or T wave abnormalities, nor any arrhythmias.  Chest x-ray was normal.  At the time of admission, she had a mild leukocytosis and mild hypokalemia (potassium 3.3).  TSH and lipids were normal.   Past Medical History:  Diagnosis Date   Allergy    Asthma    Hypertension    Migraines    cluster    Past Surgical History:  Procedure Laterality Date   ABDOMINAL HYSTERECTOMY  1985   LIGAMENT REPAIR Right    birth defect       Inpatient Medications: Scheduled Meds:  amLODipine  5 mg Oral Daily   aspirin  81 mg Oral Daily   enoxaparin (LOVENOX) injection  40 mg  Subcutaneous QHS   hydrochlorothiazide  25 mg Oral Daily   mometasone-formoterol  2 puff Inhalation BID   [START ON 04/29/2017] pneumococcal 23 valent vaccine  0.5 mL Intramuscular Tomorrow-1000   Continuous Infusions:  PRN Meds: acetaminophen, albuterol, cyclobenzaprine, gi cocktail, morphine injection, nitroGLYCERIN, ondansetron (ZOFRAN) IV  Allergies:    Allergies  Allergen Reactions   Codeine Nausea And Vomiting   Lisinopril     cough    Social History:   Social History   Socioeconomic History   Marital status: Married    Spouse name: Not on file   Number of children: Not on file   Years of education: Not on file   Highest education level: Not on file  Social Needs   Financial resource strain: Not on file   Food insecurity - worry: Not on file   Food insecurity - inability: Not on file   Transportation needs - medical: Not on file   Transportation needs - non-medical: Not on file  Occupational History   Occupation: Solicitor  Tobacco Use   Smoking status: Never Smoker   Smokeless tobacco: Never Used  Substance and Sexual Activity   Alcohol use: Yes    Alcohol/week: 0.0 oz    Comment: rare   Drug use: No   Sexual activity: Yes  Other Topics Concern   Not on file  Social History Narrative   Not  on file    Family History:   No family history of premature coronary disease.  Family History  Problem Relation Age of Onset   Dementia Mother    Cancer Father    CAD Neg Hx      ROS:  Please see the history of present illness.  ROS  All other ROS reviewed and negative.     Physical Exam/Data:   Vitals:   04/27/17 2315 04/28/17 0036 04/28/17 0438 04/28/17 0735  BP: 134/71 131/69 128/63   Pulse: 89 88 81 85  Resp: 18 18 18 16   Temp:  98.6 F (37 C) 98.5 F (36.9 C)   TempSrc:  Oral Other (Comment)   SpO2: 98% 99% 100% 98%  Weight:  191 lb 3.2 oz (86.7 kg)    Height:  5\' 3"  (1.6 m)      Intake/Output Summary (Last  24 hours) at 04/28/2017 0833 Last data filed at 04/28/2017 0001 Gross per 24 hour  Intake 240 ml  Output 425 ml  Net -185 ml   Filed Weights   04/27/17 2038 04/28/17 0036  Weight: 180 lb (81.6 kg) 191 lb 3.2 oz (86.7 kg)   Body mass index is 33.87 kg/m.  General:  Well nourished, well developed, in no acute distress HEENT: normal Lymph: no adenopathy Neck: no JVD Endocrine:  No thryomegaly Cardiac:  normal S1, S2; RRR; no murmur  Lungs:  clear to auscultation bilaterally, no wheezing, rhonchi or rales  Abd: soft, nontender, no hepatomegaly  Ext: no edema Musculoskeletal:  No deformities, BUE and BLE strength normal and equal Skin: warm and dry  Neuro:  CNs 2-12 intact, no focal abnormalities noted Psych:  Normal affect    Telemetry:  Telemetry was personally reviewed and demonstrates:  Sinus rhythm  Relevant CV Studies: None  Laboratory Data:  Chemistry Recent Labs  Lab 04/27/17 2040  NA 136  K 3.3*  CL 103  CO2 26  GLUCOSE 139*  BUN 16  CREATININE 0.82  CALCIUM 9.2  GFRNONAA >60  GFRAA >60  ANIONGAP 7    No results for input(s): PROT, ALBUMIN, AST, ALT, ALKPHOS, BILITOT in the last 168 hours. Hematology Recent Labs  Lab 04/27/17 2040  WBC 12.3*  RBC 4.05  HGB 11.7*  HCT 35.4*  MCV 87.4  MCH 28.9  MCHC 33.1  RDW 12.9  PLT 300   Cardiac Enzymes Recent Labs  Lab 04/27/17 2317 04/28/17 0527  TROPONINI <0.03 <0.03    Recent Labs  Lab 04/27/17 2052  TROPIPOC 0.00    BNPNo results for input(s): BNP, PROBNP in the last 168 hours.  DDimer No results for input(s): DDIMER in the last 168 hours.  Radiology/Studies:  Dg Chest 2 View  Result Date: 04/27/2017 CLINICAL DATA:  66 year old female with chest pain and shortness of breath. EXAM: CHEST  2 VIEW COMPARISON:  None. FINDINGS: The heart size and mediastinal contours are within normal limits. Mild left basilar atelectasis.  Both lungs are otherwise clear. The visualized skeletal structures  are unremarkable. IMPRESSION: No active cardiopulmonary disease. Electronically Signed   By: Kristopher Oppenheim M.D.   On: 04/27/2017 21:11    Assessment and Plan:   1. Chest pain: There is a mixed picture of typical and atypical symptoms for ischemic heart disease.  She has ruled out for an acute coronary syndrome with serial normal troponins.  Current 10-year ASCVD risk is 6.3%.  I will obtain an exercise Myoview stress test to evaluate for ischemic heart disease.  2. Hypertension: Controlled on present therapy which includes amlodipine and hydrochlorothiazide.  No changes to therapy    For questions or updates, please contact [place] Please consult [email or web address] for contact info under Cardiology/STEMI.   Signed, Kate Sable, MD  04/28/2017 8:33 AM

## 2017-04-28 NOTE — Progress Notes (Signed)
Unable to do treadmill myoview due to knee issue. She does not have wheezing, therefore I switched her to [employer].   Myoview completed without significant complication, pending result by [facility] radiology.   Hilbert Corrigan PA Pager: [phone]

## 2017-04-28 NOTE — Progress Notes (Signed)
Pt is tearful about cath procedure. Stated that she wanted to shower and get rest for tonight.

## 2017-04-28 NOTE — Progress Notes (Signed)
PROGRESS NOTE    Traci Johnson  DXI:[id number] DOB: [birth year] DOA: 04/27/2017 PCP: Celene Squibb, MD    Brief Narrative:  Traci Johnson is a 66 y.o. female with medical history significant of asthma and HTN presenting with acute onset of chest pain.  It felt like she had an elephant sitting on her chest with stabbing pains in her back.  Symptoms started about 6:30 pm.  She was sitting on the sofa.  It "came on with a bang" and they went straight to the urgent care in [facility] and they sent her here by ambulance.  The pain finally resolved with the 4th NTG pill - it did get better with the other NTG in that the pain was gone but she still had a lot of pressure.  The 4th pill resolved the pressure but also bottomed her BP out.  No associated symptoms.  No h/o chest pain, never had a stress test.     Assessment & Plan:   Principal Problem:   Chest pain Active Problems:   Essential hypertension   Asthma, chronic   Hyperglycemia   Anemia   1-Chest pain; enzymes negative.  Stress test Intermediate risk.  Cardiology will speak with patient regarding Cath.    2-Mild transaminases;  Check Hepatitis pane.  Liver US.   HTN; continue with current medications.   Hypokalemia replete orally.    Hyperglycemia -\follow Hb A1c.   Asthma -Continue home Albuterol and Advair (formulary substitution for Dulera)  Anemia -follow CBC    DVT prophylaxis: lovenox Code Status: Full code.  Family Communication son at bedside.  Disposition Plan: home when stable.   Consultants:   Cartdiology  Procedures:  Stress test   Antimicrobials:  none   Subjective: Chest pain free. Denies chest pain on exertion.    Objective: Vitals:   04/28/17 1139 04/28/17 1140 04/28/17 1143 04/28/17 1144  BP: (!) 154/63 (!) 149/65 (!) 143/76   Pulse: 99 (!) 101 99 94  Resp:      Temp:      TempSrc:      SpO2:      Weight:      Height:        Intake/Output Summary (Last 24 hours) at  04/28/2017 1631 Last data filed at 04/28/2017 0900 Gross per 24 hour  Intake 240 ml  Output 425 ml  Net -185 ml   Filed Weights   04/27/17 2038 04/28/17 0036  Weight: 81.6 kg (180 lb) 86.7 kg (191 lb 3.2 oz)    Examination:  General exam: Appears calm and comfortable  Respiratory system: Clear to auscultation. Respiratory effort normal. Cardiovascular system: S1 & S2 heard, RRR. No JVD, murmurs, rubs, gallops or clicks. No pedal edema. Gastrointestinal system: Abdomen is nondistended, soft and nontender. No organomegaly or masses felt. Normal bowel sounds heard. Central nervous system: Alert and oriented. No focal neurological deficits. Extremities: Symmetric 5 x 5 power. Skin: No rashes, lesions or ulcers Psychiatry: Judgement and insight appear normal. Mood & affect appropriate.     Data Reviewed: I have personally reviewed following labs and imaging studies  CBC: Recent Labs  Lab 04/27/17 2040 04/28/17 1338  WBC 12.3* 10.0  HGB 11.7* 12.6  HCT 35.4* 38.8  MCV 87.4 88.8  PLT 300 767   Basic Metabolic Panel: Recent Labs  Lab 04/27/17 2040 04/28/17 1338  NA 136 137  K 3.3* 3.4*  CL 103 107  CO2 26 23  GLUCOSE 139* 118*  BUN 16 10  CREATININE  0.82 0.54  CALCIUM 9.2 9.4   GFR: Estimated Creatinine Clearance: 73.2 mL/min (by C-G formula based on SCr of 0.54 mg/dL). Liver Function Tests: Recent Labs  Lab 04/28/17 1338  AST 67*  ALT 67*  ALKPHOS 108  BILITOT 0.8  PROT 6.3*  ALBUMIN 3.2*   Recent Labs  Lab 04/28/17 1338  LIPASE 35   No results for input(s): AMMONIA in the last 168 hours. Coagulation Profile: No results for input(s): INR, PROTIME in the last 168 hours. Cardiac Enzymes: Recent Labs  Lab 04/27/17 2317 04/28/17 0527 04/28/17 1338  TROPONINI <0.03 <0.03 <0.03   BNP (last 3 results) No results for input(s): PROBNP in the last 8760 hours. HbA1C: No results for input(s): HGBA1C in the last 72 hours. CBG: No results for input(s):  GLUCAP in the last 168 hours. Lipid Profile: Recent Labs    04/28/17 0527  CHOL 169  HDL 67  LDLCALC 72  TRIG 148  CHOLHDL 2.5   Thyroid Function Tests: Recent Labs    04/27/17 2309  TSH 2.028   Anemia Panel: No results for input(s): VITAMINB12, FOLATE, FERRITIN, TIBC, IRON, RETICCTPCT in the last 72 hours. Sepsis Labs: No results for input(s): PROCALCITON, LATICACIDVEN in the last 168 hours.  No results found for this or any previous visit (from the past 240 hour(s)).       Radiology Studies: Dg Chest 2 View  Result Date: 04/27/2017 CLINICAL DATA:  66 year old female with chest pain and shortness of breath. EXAM: CHEST  2 VIEW COMPARISON:  None. FINDINGS: The heart size and mediastinal contours are within normal limits. Mild left basilar atelectasis.  Both lungs are otherwise clear. The visualized skeletal structures are unremarkable. IMPRESSION: No active cardiopulmonary disease. Electronically Signed   By: Kristopher Oppenheim M.D.   On: 04/27/2017 21:11   Nm Myocar Multi W/spect W/wall Motion / Ef  Result Date: 04/28/2017 CLINICAL DATA:  66 year old with chest pain and palpitations. Intermediate to high probability. EXAM: MYOCARDIAL IMAGING WITH SPECT (REST AND PHARMACOLOGIC-STRESS) GATED LEFT VENTRICULAR WALL MOTION STUDY LEFT VENTRICULAR EJECTION FRACTION TECHNIQUE: Standard myocardial SPECT imaging was performed after resting intravenous injection of 10 mCi Tc-89m tetrofosmin. Subsequently, intravenous infusion of Lexiscan was performed under the supervision of the Cardiology staff. At peak effect of the drug, 30 mCi Tc-14m tetrofosmin was injected intravenously and standard myocardial SPECT imaging was performed. Quantitative gated imaging was also performed to evaluate left ventricular wall motion, and estimate left ventricular ejection fraction. COMPARISON:  None. FINDINGS: Perfusion: No significant uptake along the septal wall on both the rest and stress images. There is  moderate reversibility in the lateral wall and inferolateral wall in the mid and basal segments. Findings along the lateral wall are concerning for ischemia. Wall Motion: Normal left ventricular wall motion. In particular, there appears to be normal motion in the septal wall. No left ventricular dilation. Left Ventricular Ejection Fraction: 71 % End diastolic volume 58 ml End systolic volume 17 ml IMPRESSION: 1. Reversibility in the lateral wall and inferolateral wall within the mid and basal segments. Findings are concerning for ischemia in these areas. 2. Fixed defect involving the septal wall with normal wall motion in this area. Not clear if this is related to a prior infarct based on the normal wall motion. In addition, difficult to exclude peri-infarct ischemia involving the septal wall. 3. Left ventricular ejection fraction is 71%. 4. Non invasive risk stratification*: Intermediate *2012 Appropriate Use Criteria for Coronary Revascularization Focused Update: J Am Coll Cardiol. 2012;59(9):857-881.  http://content.[email or web address]?[email or web address] Electronically Signed   By: Markus Daft M.D.   On: 04/28/2017 15:47        Scheduled Meds:  amLODipine  5 mg Oral Daily   aspirin  81 mg Oral Daily   enoxaparin (LOVENOX) injection  40 mg Subcutaneous QHS   hydrochlorothiazide  25 mg Oral Daily   mometasone-formoterol  2 puff Inhalation BID   [START ON 04/29/2017] pneumococcal 23 valent vaccine  0.5 mL Intramuscular Tomorrow-1000   potassium chloride  40 mEq Oral Once   regadenoson       Continuous Infusions:   LOS: 0 days    Time spent: 35 minutes.     Elmarie Shiley, MD [facility] Pager [phone]  If 7PM-7AM, please contact night-coverage [email or web address] Password [facility] 04/28/2017, 4:31 PM

## 2017-04-28 NOTE — Plan of Care (Signed)
Nutrition: Adequate nutrition will be maintained 04/28/2017 0236 - Completed/Met by Evert Kohl, RN

## 2017-04-28 NOTE — Plan of Care (Signed)
  Education: Understanding of cardiac disease, CV risk reduction, and recovery process will improve 04/28/2017 0233 - Completed/Met by Rex Kras, RN   Activity: Ability to tolerate increased activity will improve 04/28/2017 0233 - Completed/Met by Rex Kras, RN   Cardiac: Ability to achieve and maintain adequate cardiovascular perfusion will improve 04/28/2017 0233 - Completed/Met by Rex Kras, RN   Elimination: Will not experience complications related to bowel motility 04/28/2017 0233 - Completed/Met by Rex Kras, RN   Pain Managment: General experience of comfort will improve 04/28/2017 0233 - Completed/Met by Rex Kras, RN

## 2017-04-28 NOTE — Plan of Care (Signed)
Safety: Ability to remain free from injury will improve 04/28/2017 0726 - Completed/Met by Evert Kohl, RN

## 2017-04-28 NOTE — Plan of Care (Signed)
Education: Understanding of cardiac disease, CV risk reduction, and recovery process will improve 04/28/2017 0233 - Completed/Met by Evert Kohl, RN   Activity: Ability to tolerate increased activity will improve 04/28/2017 0233 - Completed/Met by Evert Kohl, RN   Cardiac: Ability to achieve and maintain adequate cardiovascular perfusion will improve 04/28/2017 0233 - Completed/Met by Evert Kohl, RN

## 2017-04-28 NOTE — Progress Notes (Signed)
Pt returned from stress test. Paged PA for diet order.

## 2017-04-29 ENCOUNTER — Observation Stay (HOSPITAL_COMMUNITY): Payer: BLUE CROSS/BLUE SHIELD

## 2017-04-29 DIAGNOSIS — K8020 Calculus of gallbladder without cholecystitis without obstruction: Secondary | ICD-10-CM | POA: Diagnosis not present

## 2017-04-29 DIAGNOSIS — R740 Nonspecific elevation of levels of transaminase and lactic acid dehydrogenase [LDH]: Secondary | ICD-10-CM | POA: Diagnosis not present

## 2017-04-29 DIAGNOSIS — J45909 Unspecified asthma, uncomplicated: Secondary | ICD-10-CM | POA: Diagnosis not present

## 2017-04-29 DIAGNOSIS — R079 Chest pain, unspecified: Secondary | ICD-10-CM | POA: Diagnosis not present

## 2017-04-29 DIAGNOSIS — R748 Abnormal levels of other serum enzymes: Secondary | ICD-10-CM | POA: Diagnosis not present

## 2017-04-29 DIAGNOSIS — R072 Precordial pain: Secondary | ICD-10-CM | POA: Diagnosis not present

## 2017-04-29 DIAGNOSIS — E876 Hypokalemia: Secondary | ICD-10-CM

## 2017-04-29 DIAGNOSIS — I10 Essential (primary) hypertension: Secondary | ICD-10-CM | POA: Diagnosis not present

## 2017-04-29 DIAGNOSIS — R9439 Abnormal result of other cardiovascular function study: Secondary | ICD-10-CM

## 2017-04-29 LAB — BASIC METABOLIC PANEL
Anion gap: 5 (ref 5–15)
BUN: 13 mg/dL (ref 6–20)
CALCIUM: 9.5 mg/dL (ref 8.9–10.3)
CO2: 24 mmol/L (ref 22–32)
CREATININE: 0.63 mg/dL (ref 0.44–1.00)
Chloride: 107 mmol/L (ref 101–111)
GFR calc Af Amer: >60 mL/min (ref >60)
GLUCOSE: 158 mg/dL — AB (ref 65–99)
POTASSIUM: 3.9 mmol/L (ref 3.5–5.1)
SODIUM: 136 mmol/L (ref 135–145)

## 2017-04-29 LAB — HEPATIC FUNCTION PANEL
ALT: 49 U/L (ref 14–54)
AST: 35 U/L (ref 15–41)
Albumin: 2.8 g/dL — ABNORMAL LOW (ref 3.5–5.0)
Alkaline Phosphatase: 94 U/L (ref 38–126)
Total Bilirubin: 0.5 mg/dL (ref 0.3–1.2)
Total Protein: 5.6 g/dL — ABNORMAL LOW (ref 6.5–8.1)

## 2017-04-29 LAB — HEMOGLOBIN A1C
Hgb A1c MFr Bld: 5.8 % — ABNORMAL HIGH (ref 4.8–5.6)
MEAN PLASMA GLUCOSE: 120 mg/dL

## 2017-04-29 LAB — SURGICAL PCR SCREEN
MRSA, PCR: NEGATIVE
Staphylococcus aureus: NEGATIVE

## 2017-04-29 MED ORDER — HYDROCODONE-ACETAMINOPHEN 5-325 MG PO TABS
1.0000 | ORAL_TABLET | Freq: Four times a day (QID) | ORAL | Status: DC | PRN
  Filled 2017-04-29: qty 1

## 2017-04-29 MED ORDER — MORPHINE SULFATE (PF) 2 MG/ML IV SOLN
1.0000 mg | INTRAVENOUS | Status: DC | PRN
  Administered 2017-04-29 – 2017-05-01 (×4): 1 mg via INTRAVENOUS
  Filled 2017-04-29 (×4): qty 1

## 2017-04-29 MED ORDER — SALINE SPRAY 0.65 % NA SOLN
1.0000 | NASAL | Status: DC | PRN
  Filled 2017-04-29: qty 44

## 2017-04-29 MED ORDER — SODIUM CHLORIDE 0.9 % IV SOLN: 250.0000 mL | INTRAVENOUS | Status: DC | PRN

## 2017-04-29 MED ORDER — SODIUM CHLORIDE 0.9% FLUSH
3.0000 mL | Freq: Two times a day (BID) | INTRAVENOUS | Status: DC
  Administered 2017-04-30: 3 mL via INTRAVENOUS

## 2017-04-29 MED ORDER — SODIUM CHLORIDE 0.9% FLUSH: 3.0000 mL | INTRAVENOUS | Status: DC | PRN

## 2017-04-29 MED ORDER — SODIUM CHLORIDE 0.9 % WEIGHT BASED INFUSION: 1.0000 mL/kg/h | INTRAVENOUS | Status: DC

## 2017-04-29 MED ORDER — ASPIRIN 81 MG PO CHEW
81.0000 mg | CHEWABLE_TABLET | ORAL | Status: AC
  Administered 2017-04-30: 81 mg via ORAL

## 2017-04-29 MED ORDER — SODIUM CHLORIDE 0.9 % WEIGHT BASED INFUSION: 3.0000 mL/kg/h | INTRAVENOUS | Status: DC

## 2017-04-29 NOTE — Progress Notes (Signed)
Pt PCR sent for testing.

## 2017-04-29 NOTE — Progress Notes (Signed)
Progress Note  Patient Name: Traci Johnson Date of Encounter: 04/29/2017  Primary Cardiologist: Dr. Bronson Ing (new)  Subjective   Denies chest pain and shortness of breath. Tearful when describing her former husband's passing and mother's passing within nine days. She also has a son with lung cancer.  Inpatient Medications    Scheduled Meds:  amLODipine  5 mg Oral Daily   aspirin  81 mg Oral Daily   enoxaparin (LOVENOX) injection  40 mg Subcutaneous QHS   hydrochlorothiazide  25 mg Oral Daily   mometasone-formoterol  2 puff Inhalation BID   pneumococcal 23 valent vaccine  0.5 mL Intramuscular Tomorrow-1000   Continuous Infusions:  PRN Meds: acetaminophen, albuterol, cyclobenzaprine, diphenhydrAMINE, gi cocktail, HYDROcodone-acetaminophen, morphine injection, nitroGLYCERIN, ondansetron (ZOFRAN) IV   Vital Signs    Vitals:   04/28/17 1916 04/28/17 1934 04/29/17 0012 04/29/17 0446  BP: (!) 150/75  120/76 130/70  Pulse: 96  88 76  Resp: 18  18 18   Temp: 98.1 F (36.7 C)  98 F (36.7 C) 98.1 F (36.7 C)  TempSrc: Oral  Oral Oral  SpO2: 96% 95% 96% 98%  Weight:    185 lb 11.2 oz (84.2 kg)  Height:        Intake/Output Summary (Last 24 hours) at 04/29/2017 0812 Last data filed at 04/28/2017 2133 Gross per 24 hour  Intake 480 ml  Output 600 ml  Net -120 ml   Filed Weights   04/27/17 2038 04/28/17 0036 04/29/17 0446  Weight: 180 lb (81.6 kg) 191 lb 3.2 oz (86.7 kg) 185 lb 11.2 oz (84.2 kg)    Telemetry    Sinus rhythm - Personally Reviewed   Physical Exam   GEN: No acute distress.   Neck: No JVD Cardiac: RRR, no murmurs, rubs, or gallops.  Respiratory: Clear to auscultation bilaterally. GI: Soft, nontender, non-distended  MS: No edema; No deformity. Neuro:  Nonfocal  Psych: Normal affect   Labs    Chemistry Recent Labs  Lab 04/27/17 2040 04/28/17 1338 04/29/17 0415  NA 136 137  --   K 3.3* 3.4*  --   CL 103 107  --   CO2 26 23  --    GLUCOSE 139* 118*  --   BUN 16 10  --   CREATININE 0.82 0.54  --   CALCIUM 9.2 9.4  --   PROT  --  6.3* 5.6*  ALBUMIN  --  3.2* 2.8*  AST  --  67* 35  ALT  --  67* 49  ALKPHOS  --  108 94  BILITOT  --  0.8 0.5  GFRNONAA >60 >60  --   GFRAA >60 >60  --   ANIONGAP 7 7  --      Hematology Recent Labs  Lab 04/27/17 2040 04/28/17 1338  WBC 12.3* 10.0  RBC 4.05 4.37  HGB 11.7* 12.6  HCT 35.4* 38.8  MCV 87.4 88.8  MCH 28.9 28.8  MCHC 33.1 32.5  RDW 12.9 13.4  PLT 300 312    Cardiac Enzymes Recent Labs  Lab 04/27/17 2317 04/28/17 0527 04/28/17 1338  TROPONINI <0.03 <0.03 <0.03    Recent Labs  Lab 04/27/17 2052  TROPIPOC 0.00     BNPNo results for input(s): BNP, PROBNP in the last 168 hours.   DDimer No results for input(s): DDIMER in the last 168 hours.   Radiology    Dg Chest 2 View  Result Date: 04/27/2017 CLINICAL DATA:  66 year old female with chest pain and  shortness of breath. EXAM: CHEST  2 VIEW COMPARISON:  None. FINDINGS: The heart size and mediastinal contours are within normal limits. Mild left basilar atelectasis.  Both lungs are otherwise clear. The visualized skeletal structures are unremarkable. IMPRESSION: No active cardiopulmonary disease. Electronically Signed   By: Kristopher Oppenheim M.D.   On: 04/27/2017 21:11   Nm Myocar Multi W/spect W/wall Motion / Ef  Result Date: 04/28/2017 CLINICAL DATA:  66 year old with chest pain and palpitations. Intermediate to high probability. EXAM: MYOCARDIAL IMAGING WITH SPECT (REST AND PHARMACOLOGIC-STRESS) GATED LEFT VENTRICULAR WALL MOTION STUDY LEFT VENTRICULAR EJECTION FRACTION TECHNIQUE: Standard myocardial SPECT imaging was performed after resting intravenous injection of 10 mCi Tc-73m tetrofosmin. Subsequently, intravenous infusion of Lexiscan was performed under the supervision of the Cardiology staff. At peak effect of the drug, 30 mCi Tc-64m tetrofosmin was injected intravenously and standard myocardial  SPECT imaging was performed. Quantitative gated imaging was also performed to evaluate left ventricular wall motion, and estimate left ventricular ejection fraction. COMPARISON:  None. FINDINGS: Perfusion: No significant uptake along the septal wall on both the rest and stress images. There is moderate reversibility in the lateral wall and inferolateral wall in the mid and basal segments. Findings along the lateral wall are concerning for ischemia. Wall Motion: Normal left ventricular wall motion. In particular, there appears to be normal motion in the septal wall. No left ventricular dilation. Left Ventricular Ejection Fraction: 71 % End diastolic volume 58 ml End systolic volume 17 ml IMPRESSION: 1. Reversibility in the lateral wall and inferolateral wall within the mid and basal segments. Findings are concerning for ischemia in these areas. 2. Fixed defect involving the septal wall with normal wall motion in this area. Not clear if this is related to a prior infarct based on the normal wall motion. In addition, difficult to exclude peri-infarct ischemia involving the septal wall. 3. Left ventricular ejection fraction is 71%. 4. Non invasive risk stratification*: Intermediate *2012 Appropriate Use Criteria for Coronary Revascularization Focused Update: J Am Coll Cardiol. 3267;12(4):580-998. http://content.[email or web address]?articleid=[email or web address] Electronically Signed   By: Markus Daft M.D.   On: 04/28/2017 15:47    Cardiac Studies   See stress test above.  Patient Profile     66 y.o. female with a history of hypertension and asthma hospitalized for new-onset chest pain.  Assessment & Plan    1. Chest pain with abnormal stress test: She has had no further symptoms. Will plan for coronary angiography on 11/26. Continue ASA. Risks and benefits of cardiac catheterization have been discussed with the patient.  These include bleeding, infection, kidney damage, stroke, heart attack, death.  The patient  understands these risks and is willing to proceed.   2. Hypertension: Controlled on present therapy which includes amlodipine and hydrochlorothiazide.  No changes to therapy.  3. Hypokalemia: I have ordered a basic metabolic panel for today which is pending.  4. Elevated liver transaminases: RUQ ultrasound results pending. Levels are normal today.     For questions or updates, please contact [place] Please consult [email or web address] for contact info under Cardiology/STEMI.      Signed, Kate Sable, MD  04/29/2017, 8:12 AM

## 2017-04-29 NOTE — Progress Notes (Signed)
PROGRESS NOTE    Traci Johnson  KGM:[id number] DOB: 1951/02/18 DOA: 04/27/2017 PCP: Benita Stabile, MD    Brief Narrative:  Traci Johnson is a 66 y.o. female with medical history significant of asthma and HTN presenting with acute onset of chest pain.  It felt like she had an elephant sitting on her chest with stabbing pains in her back.  Symptoms started about 6:30 pm.  She was sitting on the sofa.  It "came on with a bang" and they went straight to the urgent care in [facility] and they sent her here by ambulance.  The pain finally resolved with the 4th NTG pill - it did get better with the other NTG in that the pain was gone but she still had a lot of pressure.  The 4th pill resolved the pressure but also bottomed her BP out.  No associated symptoms.  No h/o chest pain, never had a stress test.     Assessment & Plan:   Principal Problem:   Chest pain Active Problems:   Essential hypertension   Asthma, chronic   Hyperglycemia   Anemia   1-Chest pain; enzymes negative.  Stress test Intermediate risk.  Cardiology will speak with patient regarding Cath.    2-Mild transaminases; resolved Hepatitis pane pending Liver US cholelithiasis.   HTN; continue with current medications.   Hypokalemia resolved   Hyperglycemia -\follow Hb A1c.   Asthma -Continue home Albuterol and Advair (formulary substitution for Dulera)  Anemia -hb stable.   Headaches;  Morphine IV PRN  DVT prophylaxis: lovenox Code Status: Full code.  Family Communication son at bedside.  Disposition Plan: home when stable.   Consultants:   Cartdiology  Procedures:  Stress test   Antimicrobials:  none   Subjective: Denies chest pain.  She is complaining of headaches.    Objective: Vitals:   04/28/17 1916 04/28/17 1934 04/29/17 0012 04/29/17 0446  BP: (!) 150/75  120/76 130/70  Pulse: 96  88 76  Resp: 18  18 18   Temp: 98.1 F (36.7 C)  98 F (36.7 C) 98.1 F (36.7 C)  TempSrc: Oral   Oral Oral  SpO2: 96% 95% 96% 98%  Weight:    84.2 kg (185 lb 11.2 oz)  Height:        Intake/Output Summary (Last 24 hours) at 04/29/2017 1040 Last data filed at 04/29/2017 0900 Gross per 24 hour  Intake 720 ml  Output 600 ml  Net 120 ml   Filed Weights   04/27/17 2038 04/28/17 0036 04/29/17 0446  Weight: 81.6 kg (180 lb) 86.7 kg (191 lb 3.2 oz) 84.2 kg (185 lb 11.2 oz)    Examination:  General exam: NAD Respiratory system: CTA Cardiovascular system: S 1, S 2 RRR Gastrointestinal system: BS present, soft, nt Central nervous system: non focal  Extremities: symmetric power.  Skin: no rash.      Data Reviewed: I have personally reviewed following labs and imaging studies  CBC: Recent Labs  Lab 04/27/17 2040 04/28/17 1338  WBC 12.3* 10.0  HGB 11.7* 12.6  HCT 35.4* 38.8  MCV 87.4 88.8  PLT 300 312   Basic Metabolic Panel: Recent Labs  Lab 04/27/17 2040 04/28/17 1338 04/29/17 0833  NA 136 137 136  K 3.3* 3.4* 3.9  CL 103 107 107  CO2 26 23 24   GLUCOSE 139* 118* 158*  BUN 16 10 13   CREATININE 0.82 0.54 0.63  CALCIUM 9.2 9.4 9.5   GFR: Estimated Creatinine Clearance: 72.1 mL/min (by  C-G formula based on SCr of 0.63 mg/dL). Liver Function Tests: Recent Labs  Lab 04/28/17 1338 04/29/17 0415  AST 67* 35  ALT 67* 49  ALKPHOS 108 94  BILITOT 0.8 0.5  PROT 6.3* 5.6*  ALBUMIN 3.2* 2.8*   Recent Labs  Lab 04/28/17 1338  LIPASE 35   No results for input(s): AMMONIA in the last 168 hours. Coagulation Profile: No results for input(s): INR, PROTIME in the last 168 hours. Cardiac Enzymes: Recent Labs  Lab 04/27/17 2317 04/28/17 0527 04/28/17 1338  TROPONINI <0.03 <0.03 <0.03   BNP (last 3 results) No results for input(s): PROBNP in the last 8760 hours. HbA1C: Recent Labs    04/27/17 2309  HGBA1C 5.8*   CBG: No results for input(s): GLUCAP in the last 168 hours. Lipid Profile: Recent Labs    04/28/17 0527  CHOL 169  HDL 67  LDLCALC  72  TRIG 161  CHOLHDL 2.5   Thyroid Function Tests: Recent Labs    04/27/17 2309  TSH 2.028   Anemia Panel: No results for input(s): VITAMINB12, FOLATE, FERRITIN, TIBC, IRON, RETICCTPCT in the last 72 hours. Sepsis Labs: No results for input(s): PROCALCITON, LATICACIDVEN in the last 168 hours.  No results found for this or any previous visit (from the past 240 hour(s)).       Radiology Studies: Dg Chest 2 View  Result Date: 04/27/2017 CLINICAL DATA:  66 year old female with chest pain and shortness of breath. EXAM: CHEST  2 VIEW COMPARISON:  None. FINDINGS: The heart size and mediastinal contours are within normal limits. Mild left basilar atelectasis.  Both lungs are otherwise clear. The visualized skeletal structures are unremarkable. IMPRESSION: No active cardiopulmonary disease. Electronically Signed   By: Sande Brothers M.D.   On: 04/27/2017 21:11   Nm Myocar Multi W/spect W/wall Motion / Ef  Result Date: 04/28/2017 CLINICAL DATA:  66 year old with chest pain and palpitations. Intermediate to high probability. EXAM: MYOCARDIAL IMAGING WITH SPECT (REST AND PHARMACOLOGIC-STRESS) GATED LEFT VENTRICULAR WALL MOTION STUDY LEFT VENTRICULAR EJECTION FRACTION TECHNIQUE: Standard myocardial SPECT imaging was performed after resting intravenous injection of 10 mCi Tc-49m tetrofosmin. Subsequently, intravenous infusion of Lexiscan was performed under the supervision of the Cardiology staff. At peak effect of the drug, 30 mCi Tc-49m tetrofosmin was injected intravenously and standard myocardial SPECT imaging was performed. Quantitative gated imaging was also performed to evaluate left ventricular wall motion, and estimate left ventricular ejection fraction. COMPARISON:  None. FINDINGS: Perfusion: No significant uptake along the septal wall on both the rest and stress images. There is moderate reversibility in the lateral wall and inferolateral wall in the mid and basal segments. Findings along  the lateral wall are concerning for ischemia. Wall Motion: Normal left ventricular wall motion. In particular, there appears to be normal motion in the septal wall. No left ventricular dilation. Left Ventricular Ejection Fraction: 71 % End diastolic volume 58 ml End systolic volume 17 ml IMPRESSION: 1. Reversibility in the lateral wall and inferolateral wall within the mid and basal segments. Findings are concerning for ischemia in these areas. 2. Fixed defect involving the septal wall with normal wall motion in this area. Not clear if this is related to a prior infarct based on the normal wall motion. In addition, difficult to exclude peri-infarct ischemia involving the septal wall. 3. Left ventricular ejection fraction is 71%. 4. Non invasive risk stratification*: Intermediate *2012 Appropriate Use Criteria for Coronary Revascularization Focused Update: J Am Coll Cardiol. 2012;59(9):857-881. http://content.dementiazones.com.aspx?articleid=[email or web address] Electronically Signed  By: Richarda Overlie M.D.   On: 04/28/2017 15:47   US Abdomen Limited Ruq  Result Date: 04/29/2017 CLINICAL DATA:  Abnormal transaminases. EXAM: ULTRASOUND ABDOMEN LIMITED RIGHT UPPER QUADRANT COMPARISON:  None. FINDINGS: Gallbladder: Multiple gallstones, including a non-mobile 1.6 cm stone in the gallbladder neck. No wall thickening. No sonographic Murphy sign noted by sonographer. Common bile duct: Diameter: 5 mm Liver: No focal lesion identified. Within normal limits in parenchymal echogenicity. Portal vein is patent on color Doppler imaging with normal direction of blood flow towards the liver. IMPRESSION: 1. Cholelithiasis without evidence of acute cholecystitis. No biliary dilatation. 2. Unremarkable appearance of the liver. Electronically Signed   By: Sebastian Ache M.D.   On: 04/29/2017 08:49        Scheduled Meds: . amLODipine  5 mg Oral Daily  . aspirin  81 mg Oral Daily  . enoxaparin (LOVENOX) injection  40 mg Subcutaneous  QHS  . hydrochlorothiazide  25 mg Oral Daily  . mometasone-formoterol  2 puff Inhalation BID  . pneumococcal 23 valent vaccine  0.5 mL Intramuscular Tomorrow-1000   Continuous Infusions:   LOS: 0 days    Time spent: 35 minutes.     Alba Cory, MD [facility] Pager [phone]  If 7PM-7AM, please contact night-coverage [email or web address] Password [email or web address] 04/29/2017, 10:40 AM

## 2017-04-29 NOTE — Progress Notes (Signed)
Pt didn't not want to take vicoden due to Nausea and vomiting. MD made aware. Will try Morphine and heating pad for HA

## 2017-04-29 NOTE — Plan of Care (Signed)
Activity: Risk for activity intolerance will decrease 04/29/2017 0800 - Completed/Met by Garnett-Mellinger, Sophronia Simas, RN

## 2017-04-29 NOTE — Plan of Care (Signed)
Patient without complaint on 7 a to 7 p shift other than migraine headache which is somewhat improved with Morphine IV and hot packs.  VSS.  Patient aware she is nothing by mouth after midnight for possible cardiac cath 04/30/17.

## 2017-04-30 ENCOUNTER — Encounter (HOSPITAL_COMMUNITY)
Admission: EM | Disposition: A | Discharge status: Home / Self Care | Payer: Self-pay | Source: Home / Self Care | Attending: Internal Medicine

## 2017-04-30 DIAGNOSIS — I200 Unstable angina: Principal | ICD-10-CM

## 2017-04-30 DIAGNOSIS — Y92239 Unspecified place in hospital as the place of occurrence of the external cause: Secondary | ICD-10-CM | POA: Diagnosis not present

## 2017-04-30 DIAGNOSIS — E876 Hypokalemia: Secondary | ICD-10-CM | POA: Diagnosis present

## 2017-04-30 DIAGNOSIS — I10 Essential (primary) hypertension: Secondary | ICD-10-CM | POA: Diagnosis not present

## 2017-04-30 DIAGNOSIS — Z888 Allergy status to other drugs, medicaments and biological substances status: Secondary | ICD-10-CM | POA: Diagnosis not present

## 2017-04-30 DIAGNOSIS — R9439 Abnormal result of other cardiovascular function study: Secondary | ICD-10-CM | POA: Diagnosis not present

## 2017-04-30 DIAGNOSIS — Z7982 Long term (current) use of aspirin: Secondary | ICD-10-CM | POA: Diagnosis not present

## 2017-04-30 DIAGNOSIS — E663 Overweight: Secondary | ICD-10-CM | POA: Diagnosis present

## 2017-04-30 DIAGNOSIS — D72829 Elevated white blood cell count, unspecified: Secondary | ICD-10-CM | POA: Diagnosis present

## 2017-04-30 DIAGNOSIS — X58XXXA Exposure to other specified factors, initial encounter: Secondary | ICD-10-CM | POA: Diagnosis not present

## 2017-04-30 DIAGNOSIS — Z6831 Body mass index (BMI) 31.0-31.9, adult: Secondary | ICD-10-CM | POA: Diagnosis not present

## 2017-04-30 DIAGNOSIS — I951 Orthostatic hypotension: Secondary | ICD-10-CM | POA: Diagnosis not present

## 2017-04-30 DIAGNOSIS — Z90710 Acquired absence of both cervix and uterus: Secondary | ICD-10-CM | POA: Diagnosis not present

## 2017-04-30 DIAGNOSIS — Z885 Allergy status to narcotic agent status: Secondary | ICD-10-CM | POA: Diagnosis not present

## 2017-04-30 DIAGNOSIS — K8010 Calculus of gallbladder with chronic cholecystitis without obstruction: Secondary | ICD-10-CM | POA: Diagnosis not present

## 2017-04-30 DIAGNOSIS — R55 Syncope and collapse: Secondary | ICD-10-CM | POA: Diagnosis not present

## 2017-04-30 DIAGNOSIS — D649 Anemia, unspecified: Secondary | ICD-10-CM | POA: Diagnosis not present

## 2017-04-30 DIAGNOSIS — G43909 Migraine, unspecified, not intractable, without status migrainosus: Secondary | ICD-10-CM | POA: Diagnosis not present

## 2017-04-30 DIAGNOSIS — Z791 Long term (current) use of non-steroidal anti-inflammatories (NSAID): Secondary | ICD-10-CM | POA: Diagnosis not present

## 2017-04-30 DIAGNOSIS — Z23 Encounter for immunization: Secondary | ICD-10-CM | POA: Diagnosis not present

## 2017-04-30 DIAGNOSIS — D539 Nutritional anemia, unspecified: Secondary | ICD-10-CM | POA: Diagnosis not present

## 2017-04-30 DIAGNOSIS — Z6832 Body mass index (BMI) 32.0-32.9, adult: Secondary | ICD-10-CM | POA: Diagnosis not present

## 2017-04-30 DIAGNOSIS — S40021A Contusion of right upper arm, initial encounter: Secondary | ICD-10-CM | POA: Diagnosis not present

## 2017-04-30 DIAGNOSIS — R51 Headache: Secondary | ICD-10-CM | POA: Diagnosis not present

## 2017-04-30 DIAGNOSIS — J45909 Unspecified asthma, uncomplicated: Secondary | ICD-10-CM | POA: Diagnosis not present

## 2017-04-30 DIAGNOSIS — R739 Hyperglycemia, unspecified: Secondary | ICD-10-CM | POA: Diagnosis present

## 2017-04-30 DIAGNOSIS — Z79899 Other long term (current) drug therapy: Secondary | ICD-10-CM | POA: Diagnosis not present

## 2017-04-30 DIAGNOSIS — I9581 Postprocedural hypotension: Secondary | ICD-10-CM | POA: Diagnosis not present

## 2017-04-30 DIAGNOSIS — R072 Precordial pain: Secondary | ICD-10-CM | POA: Diagnosis not present

## 2017-04-30 DIAGNOSIS — K580 Irritable bowel syndrome with diarrhea: Secondary | ICD-10-CM | POA: Diagnosis not present

## 2017-04-30 DIAGNOSIS — R079 Chest pain, unspecified: Secondary | ICD-10-CM | POA: Diagnosis not present

## 2017-04-30 DIAGNOSIS — K449 Diaphragmatic hernia without obstruction or gangrene: Secondary | ICD-10-CM | POA: Diagnosis not present

## 2017-04-30 DIAGNOSIS — K8020 Calculus of gallbladder without cholecystitis without obstruction: Secondary | ICD-10-CM | POA: Diagnosis present

## 2017-04-30 HISTORY — PX: LEFT HEART CATH AND CORONARY ANGIOGRAPHY: CATH118249

## 2017-04-30 LAB — HEPATITIS PANEL, ACUTE
HCV Ab: <0.1 s/co ratio (ref 0.0–0.9)
HEP A IGM: NEGATIVE
HEP B C IGM: NEGATIVE
Hepatitis B Surface Ag: NEGATIVE

## 2017-04-30 LAB — PROTIME-INR
INR: 0.91
Prothrombin Time: 12.1 seconds (ref 11.4–15.2)

## 2017-04-30 SURGERY — LEFT HEART CATH AND CORONARY ANGIOGRAPHY
Anesthesia: LOCAL

## 2017-04-30 MED ORDER — VERAPAMIL HCL 2.5 MG/ML IV SOLN
INTRA_ARTERIAL | Status: DC | PRN
  Administered 2017-04-30: 10 mL via INTRA_ARTERIAL

## 2017-04-30 MED ORDER — ONDANSETRON HCL 4 MG/2ML IJ SOLN
INTRAMUSCULAR | Status: AC
  Filled 2017-04-30: qty 2

## 2017-04-30 MED ORDER — LIDOCAINE HCL (PF) 1 % IJ SOLN
INTRAMUSCULAR | Status: DC | PRN
  Administered 2017-04-30: 1 mL via INTRADERMAL

## 2017-04-30 MED ORDER — DOPAMINE-DEXTROSE 3.2-5 MG/ML-% IV SOLN
INTRAVENOUS | Status: AC
  Filled 2017-04-30: qty 250

## 2017-04-30 MED ORDER — MIDAZOLAM HCL 2 MG/2ML IJ SOLN
INTRAMUSCULAR | Status: AC
Start: 2017-04-30 — End: ?
  Filled 2017-04-30: qty 2

## 2017-04-30 MED ORDER — FAMOTIDINE IN NACL 20-0.9 MG/50ML-% IV SOLN
INTRAVENOUS | Status: AC
  Filled 2017-04-30: qty 50

## 2017-04-30 MED ORDER — ATROPINE SULFATE 1 MG/10ML IJ SOSY
PREFILLED_SYRINGE | INTRAMUSCULAR | Status: AC
Start: 2017-04-30 — End: ?
  Filled 2017-04-30: qty 10

## 2017-04-30 MED ORDER — METHYLPREDNISOLONE SODIUM SUCC 125 MG IJ SOLR
INTRAMUSCULAR | Status: AC
  Filled 2017-04-30: qty 2

## 2017-04-30 MED ORDER — FAMOTIDINE IN NACL 20-0.9 MG/50ML-% IV SOLN
INTRAVENOUS | Status: AC | PRN
  Administered 2017-04-30: 20 mg via INTRAVENOUS

## 2017-04-30 MED ORDER — ACETAMINOPHEN 325 MG PO TABS: 650.0000 mg | ORAL_TABLET | ORAL | Status: DC | PRN

## 2017-04-30 MED ORDER — IOPAMIDOL (ISOVUE-370) INJECTION 76%
INTRAVENOUS | Status: AC
  Filled 2017-04-30: qty 100

## 2017-04-30 MED ORDER — FENTANYL CITRATE (PF) 100 MCG/2ML IJ SOLN
INTRAMUSCULAR | Status: AC
  Filled 2017-04-30: qty 2

## 2017-04-30 MED ORDER — SODIUM CHLORIDE 0.9% FLUSH
3.0000 mL | Freq: Two times a day (BID) | INTRAVENOUS | Status: DC
  Administered 2017-04-30: 3 mL via INTRAVENOUS

## 2017-04-30 MED ORDER — FENTANYL CITRATE (PF) 100 MCG/2ML IJ SOLN
INTRAMUSCULAR | Status: DC | PRN
  Administered 2017-04-30: 25 ug via INTRAVENOUS

## 2017-04-30 MED ORDER — ONDANSETRON HCL 4 MG/2ML IJ SOLN
INTRAMUSCULAR | Status: DC | PRN
  Administered 2017-04-30: 4 mg via INTRAVENOUS

## 2017-04-30 MED ORDER — METHYLPREDNISOLONE SODIUM SUCC 125 MG IJ SOLR
INTRAMUSCULAR | Status: DC | PRN
  Administered 2017-04-30: 125 mg via INTRAVENOUS

## 2017-04-30 MED ORDER — HEPARIN SODIUM (PORCINE) 1000 UNIT/ML IJ SOLN
INTRAMUSCULAR | Status: AC
  Filled 2017-04-30: qty 1

## 2017-04-30 MED ORDER — ASPIRIN 81 MG PO CHEW
81.0000 mg | CHEWABLE_TABLET | Freq: Every day | ORAL | Status: DC
  Administered 2017-05-01: 81 mg via ORAL
  Filled 2017-04-30: qty 1

## 2017-04-30 MED ORDER — HEPARIN (PORCINE) IN NACL 2-0.9 UNIT/ML-% IJ SOLN
INTRAMUSCULAR | Status: AC
  Filled 2017-04-30: qty 1000

## 2017-04-30 MED ORDER — ONDANSETRON HCL 4 MG/2ML IJ SOLN: 4.0000 mg | Freq: Four times a day (QID) | INTRAMUSCULAR | Status: DC | PRN

## 2017-04-30 MED ORDER — NITROGLYCERIN 1 MG/10 ML FOR IR/CATH LAB
INTRA_ARTERIAL | Status: AC
  Filled 2017-04-30: qty 10

## 2017-04-30 MED ORDER — MIDAZOLAM HCL 2 MG/2ML IJ SOLN
INTRAMUSCULAR | Status: DC | PRN
  Administered 2017-04-30: 1 mg via INTRAVENOUS

## 2017-04-30 MED ORDER — HEPARIN SODIUM (PORCINE) 1000 UNIT/ML IJ SOLN
INTRAMUSCULAR | Status: DC | PRN
  Administered 2017-04-30: 4000 [IU] via INTRAVENOUS

## 2017-04-30 MED ORDER — VERAPAMIL HCL 2.5 MG/ML IV SOLN
INTRAVENOUS | Status: AC
  Filled 2017-04-30: qty 2

## 2017-04-30 MED ORDER — HEPARIN (PORCINE) IN NACL 2-0.9 UNIT/ML-% IJ SOLN
INTRAMUSCULAR | Status: AC | PRN
  Administered 2017-04-30: 1000 mL

## 2017-04-30 MED ORDER — ATROPINE SULFATE 1 MG/10ML IJ SOSY
PREFILLED_SYRINGE | INTRAMUSCULAR | Status: DC | PRN
  Administered 2017-04-30 (×2): 0.5 mg via INTRAVENOUS

## 2017-04-30 MED ORDER — SODIUM CHLORIDE 0.9 % IV SOLN
250.0000 mL | INTRAVENOUS | Status: DC | PRN
  Administered 2017-05-01: 250 mL via INTRAVENOUS

## 2017-04-30 MED ORDER — DOPAMINE-DEXTROSE 3.2-5 MG/ML-% IV SOLN
INTRAVENOUS | Status: AC | PRN
  Administered 2017-04-30: 5 ug/kg/min via INTRAVENOUS

## 2017-04-30 MED ORDER — IOPAMIDOL (ISOVUE-370) INJECTION 76%
INTRAVENOUS | Status: DC | PRN
  Administered 2017-04-30: 65 mL via INTRA_ARTERIAL

## 2017-04-30 MED ORDER — SODIUM CHLORIDE 0.9% FLUSH: 3.0000 mL | INTRAVENOUS | Status: DC | PRN

## 2017-04-30 MED ORDER — SODIUM CHLORIDE 0.9 % IV SOLN: INTRAVENOUS | Status: AC

## 2017-04-30 MED ORDER — LIDOCAINE HCL (PF) 1 % IJ SOLN
INTRAMUSCULAR | Status: AC
Start: 2017-04-30 — End: ?
  Filled 2017-04-30: qty 30

## 2017-04-30 SURGICAL SUPPLY — 11 items
CATH INFINITI 5FR ANG PIGTAIL (CATHETERS) ×1 IMPLANT
CATH OPTITORQUE TIG 4.0 5F (CATHETERS) ×1 IMPLANT
GLIDESHEATH SLEND A-KIT 6F 22G (SHEATH) ×1 IMPLANT
GUIDEWIRE INQWIRE 1.5J.035X260 (WIRE) IMPLANT
INQWIRE 1.5J .035X260CM (WIRE) ×2
KIT HEART LEFT (KITS) ×2 IMPLANT
PACK CARDIAC CATHETERIZATION (CUSTOM PROCEDURE TRAY) ×2 IMPLANT
SYR MEDRAD MARK V 150ML (SYRINGE) ×2 IMPLANT
TRANSDUCER W/STOPCOCK (MISCELLANEOUS) ×2 IMPLANT
TUBING CIL FLEX 10 FLL-RA (TUBING) ×2 IMPLANT
WIRE HI TORQ VERSACORE-J 145CM (WIRE) ×1 IMPLANT

## 2017-04-30 NOTE — Interval H&P Note (Signed)
Cath Lab Visit (complete for each Cath Lab visit)  Clinical Evaluation Leading to the Procedure:   ACS: Yes.    Non-ACS:    Anginal Classification: CCS III  Anti-ischemic medical therapy: No Therapy  Non-Invasive Test Results: Intermediate-risk stress test findings: cardiac mortality 1-3%/year  Prior CABG: No previous CABG      History and Physical Interval Note:  04/30/2017 4:04 PM  Traci Johnson  has presented today for surgery, with the diagnosis of unstable angina  The various methods of treatment have been discussed with the patient and family. After consideration of risks, benefits and other options for treatment, the patient has consented to  Procedure(s): LEFT HEART CATH AND CORONARY ANGIOGRAPHY (N/A) as a surgical intervention .  The patient's history has been reviewed, patient examined, no change in status, stable for surgery.  I have reviewed the patient's chart and labs.  Questions were answered to the patient's satisfaction.     Quay Burow

## 2017-04-30 NOTE — Progress Notes (Signed)
Progress Note  Patient Name: Traci Johnson Date of Encounter: 04/30/2017  Primary Cardiologist: Dr. Bronson Ing (new)   Subjective   No new chest pain since yesterday.  Inpatient Medications    Scheduled Meds:  amLODipine  5 mg Oral Daily   aspirin  81 mg Oral Daily   enoxaparin (LOVENOX) injection  40 mg Subcutaneous QHS   hydrochlorothiazide  25 mg Oral Daily   mometasone-formoterol  2 puff Inhalation BID   pneumococcal 23 valent vaccine  0.5 mL Intramuscular Tomorrow-1000   sodium chloride flush  3 mL Intravenous Q12H   Continuous Infusions:  sodium chloride     sodium chloride 1 mL/kg/hr (04/30/17 0956)   PRN Meds: sodium chloride, acetaminophen, albuterol, cyclobenzaprine, diphenhydrAMINE, gi cocktail, morphine injection, nitroGLYCERIN, ondansetron (ZOFRAN) IV, sodium chloride, sodium chloride flush   Vital Signs    Vitals:   04/30/17 0544 04/30/17 0744 04/30/17 0832 04/30/17 1226  BP: 140/79 126/80  140/73  Pulse: 94 85  84  Resp: 18 18  18   Temp: (!) 97.4 F (36.3 C) 97.9 F (36.6 C)  98 F (36.7 C)  TempSrc: Oral Oral  Oral  SpO2: 95% 94% 95% 96%  Weight: 183 lb 8 oz (83.2 kg)     Height:        Intake/Output Summary (Last 24 hours) at 04/30/2017 1343 Last data filed at 04/30/2017 1227 Gross per 24 hour  Intake 240 ml  Output 1170 ml  Net -930 ml   Filed Weights   04/28/17 0036 04/29/17 0446 04/30/17 0544  Weight: 191 lb 3.2 oz (86.7 kg) 185 lb 11.2 oz (84.2 kg) 183 lb 8 oz (83.2 kg)    Telemetry    Normal sinus rhythm- Personally Reviewed  ECG    Normal sinus rhythm, generalized low voltage, otherwise normal without repolarization changes - Personally Reviewed  Physical Exam  Overweight GEN: No acute distress.   Neck: No JVD Cardiac: RRR, no murmurs, rubs, or gallops.  Respiratory: Clear to auscultation bilaterally. GI: Soft, nontender, non-distended  MS: No edema; No deformity. Neuro:  Nonfocal  Psych: Normal affect    Labs    Chemistry Recent Labs  Lab 04/27/17 2040 04/28/17 1338 04/29/17 0415 04/29/17 0833  NA 136 137  --  136  K 3.3* 3.4*  --  3.9  CL 103 107  --  107  CO2 26 23  --  24  GLUCOSE 139* 118*  --  158*  BUN 16 10  --  13  CREATININE 0.82 0.54  --  0.63  CALCIUM 9.2 9.4  --  9.5  PROT  --  6.3* 5.6*  --   ALBUMIN  --  3.2* 2.8*  --   AST  --  67* 35  --   ALT  --  67* 49  --   ALKPHOS  --  108 94  --   BILITOT  --  0.8 0.5  --   GFRNONAA >60 >60  --  >60  GFRAA >60 >60  --  >60  ANIONGAP 7 7  --  5     Hematology Recent Labs  Lab 04/27/17 2040 04/28/17 1338  WBC 12.3* 10.0  RBC 4.05 4.37  HGB 11.7* 12.6  HCT 35.4* 38.8  MCV 87.4 88.8  MCH 28.9 28.8  MCHC 33.1 32.5  RDW 12.9 13.4  PLT 300 312    Cardiac Enzymes Recent Labs  Lab 04/27/17 2317 04/28/17 0527 04/28/17 1338  TROPONINI <0.03 <0.03 <0.03  Recent Labs  Lab 04/27/17 2052  TROPIPOC 0.00     BNPNo results for input(s): BNP, PROBNP in the last 168 hours.   DDimer No results for input(s): DDIMER in the last 168 hours.   Radiology    US Abdomen Limited Ruq  Result Date: 04/29/2017 CLINICAL DATA:  Abnormal transaminases. EXAM: ULTRASOUND ABDOMEN LIMITED RIGHT UPPER QUADRANT COMPARISON:  None. FINDINGS: Gallbladder: Multiple gallstones, including a non-mobile 1.6 cm stone in the gallbladder neck. No wall thickening. No sonographic Murphy sign noted by sonographer. Common bile duct: Diameter: 5 mm Liver: No focal lesion identified. Within normal limits in parenchymal echogenicity. Portal vein is patent on color Doppler imaging with normal direction of blood flow towards the liver. IMPRESSION: 1. Cholelithiasis without evidence of acute cholecystitis. No biliary dilatation. 2. Unremarkable appearance of the liver. Electronically Signed   By: Logan Bores M.D.   On: 04/29/2017 08:49    Cardiac Studies   Nuclear stress test 04/28/2017 1. Reversibility in the lateral wall and inferolateral  wall within the mid and basal segments. Findings are concerning for ischemia in these areas.  2. Fixed defect involving the septal wall with normal wall motion in this area. Not clear if this is related to a prior infarct based on the normal wall motion. In addition, difficult to exclude peri-infarct ischemia involving the septal wall.  3. Left ventricular ejection fraction is 71%.  4. Non invasive risk stratification*: Intermediate  Patient Profile     66 y.o. female with limited coronary risk factors (hypertension), asthma, hospitalized for new onset chest pain and found to have an abnormal nuclear stress test.  Assessment & Plan    Plan for coronary angiography today.  She is currently asymptomatic.  I have reviewed her nuclear images myself and find them very difficult to understand.  There was a very large and very severe septal defect with normal regional wall motion: suspect a processing error.  The inferolateral defect is not impressive either.  If she indeed has a "false positive" study and has no coronary blockages, may be ready for discharge late this evening.  For questions or updates, please contact [place] Please consult [email or web address] for contact info under Cardiology/STEMI.      Signed, Sanda Klein, MD  04/30/2017, 1:43 PM

## 2017-04-30 NOTE — Progress Notes (Signed)
Patient is feeling some better. Slowly weaning Dopamine.

## 2017-04-30 NOTE — Progress Notes (Signed)
PROGRESS NOTE    Traci Johnson  AUQ:[id number] DOB: Mar 15, 1951 DOA: 04/27/2017 PCP: Celene Squibb, MD    Brief Narrative:  Traci Johnson is a 66 y.o. female with medical history significant of asthma and HTN presenting with acute onset of chest pain.  It felt like she had an elephant sitting on her chest with stabbing pains in her back.  Symptoms started about 6:30 pm.  She was sitting on the sofa.  It "came on with a bang" and they went straight to the urgent care in [facility] and they sent her here by ambulance.  The pain finally resolved with the 4th NTG pill - it did get better with the other NTG in that the pain was gone but she still had a lot of pressure.  The 4th pill resolved the pressure but also bottomed her BP out.  No associated symptoms.  No h/o chest pain, never had a stress test.     Assessment & Plan:   Principal Problem:   Chest pain Active Problems:   Essential hypertension   Asthma, chronic   Hyperglycemia   Anemia   1-Chest pain; enzymes negative.  Stress test Intermediate risk.  Cardiology will speak with patient regarding Cath.  Waiting to have cath , anxious   2-Mild transaminases; resolved Hepatitis pane negative Liver US cholelithiasis. Follow up  with GI outpatient.   HTN; continue with current medications.   Hypokalemia resolved   Hyperglycemia -\follow Hb A1c.   Asthma -Continue home Albuterol and Advair (formulary substitution for Dulera)  Anemia -hb stable.   Headaches; improved Morphine IV PRN  DVT prophylaxis: lovenox Code Status: Full code.  Family Communication no family at bedside  Disposition Plan: home when stable.   Consultants:   Cartdiology  Procedures:  Stress test   Antimicrobials:  none   Subjective: Denies chest pain.    Objective: Vitals:   04/30/17 0544 04/30/17 0744 04/30/17 0832 04/30/17 1226  BP: 140/79 126/80  140/73  Pulse: 94 85  84  Resp: 18 18  18   Temp: (!) 97.4 F (36.3 C) 97.9 F  (36.6 C)  98 F (36.7 C)  TempSrc: Oral Oral  Oral  SpO2: 95% 94% 95% 96%  Weight: 83.2 kg (183 lb 8 oz)     Height:        Intake/Output Summary (Last 24 hours) at 04/30/2017 1519 Last data filed at 04/30/2017 1227 Gross per 24 hour  Intake 240 ml  Output 1170 ml  Net -930 ml   Filed Weights   04/28/17 0036 04/29/17 0446 04/30/17 0544  Weight: 86.7 kg (191 lb 3.2 oz) 84.2 kg (185 lb 11.2 oz) 83.2 kg (183 lb 8 oz)    Examination:  General exam: NAD Respiratory system: CTA Cardiovascular system: S 1, S 2 RRR      Data Reviewed: I have personally reviewed following labs and imaging studies  CBC: Recent Labs  Lab 04/27/17 2040 04/28/17 1338  WBC 12.3* 10.0  HGB 11.7* 12.6  HCT 35.4* 38.8  MCV 87.4 88.8  PLT 300 638   Basic Metabolic Panel: Recent Labs  Lab 04/27/17 2040 04/28/17 1338 04/29/17 0833  NA 136 137 136  K 3.3* 3.4* 3.9  CL 103 107 107  CO2 26 23 24   GLUCOSE 139* 118* 158*  BUN 16 10 13   CREATININE 0.82 0.54 0.63  CALCIUM 9.2 9.4 9.5   GFR: Estimated Creatinine Clearance: 71.6 mL/min (by C-G formula based on SCr of 0.63 mg/dL). Liver Function  Tests: Recent Labs  Lab 04/28/17 1338 04/29/17 0415  AST 67* 35  ALT 67* 49  ALKPHOS 108 94  BILITOT 0.8 0.5  PROT 6.3* 5.6*  ALBUMIN 3.2* 2.8*   Recent Labs  Lab 04/28/17 1338  LIPASE 35   No results for input(s): AMMONIA in the last 168 hours. Coagulation Profile: Recent Labs  Lab 04/30/17 0500  INR 0.91   Cardiac Enzymes: Recent Labs  Lab 04/27/17 2317 04/28/17 0527 04/28/17 1338  TROPONINI <0.03 <0.03 <0.03   BNP (last 3 results) No results for input(s): PROBNP in the last 8760 hours. HbA1C: Recent Labs    04/27/17 2309  HGBA1C 5.8*   CBG: No results for input(s): GLUCAP in the last 168 hours. Lipid Profile: Recent Labs    04/28/17 0527  CHOL 169  HDL 67  LDLCALC 72  TRIG 148  CHOLHDL 2.5   Thyroid Function Tests: Recent Labs    04/27/17 2309  TSH  2.028   Anemia Panel: No results for input(s): VITAMINB12, FOLATE, FERRITIN, TIBC, IRON, RETICCTPCT in the last 72 hours. Sepsis Labs: No results for input(s): PROCALCITON, LATICACIDVEN in the last 168 hours.  Recent Results (from the past 240 hour(s))  Surgical pcr screen     Status: None   Collection Time: 04/29/17  8:20 PM  Result Value Ref Range Status   MRSA, PCR NEGATIVE NEGATIVE Final   Staphylococcus aureus NEGATIVE NEGATIVE Final    Comment: (NOTE) The Xpert SA Assay (FDA approved for NASAL specimens in patients 63 years of age and older), is one component of a comprehensive surveillance program. It is not intended to diagnose infection nor to guide or monitor treatment.          Radiology Studies: US Abdomen Limited Ruq  Result Date: 04/29/2017 CLINICAL DATA:  Abnormal transaminases. EXAM: ULTRASOUND ABDOMEN LIMITED RIGHT UPPER QUADRANT COMPARISON:  None. FINDINGS: Gallbladder: Multiple gallstones, including a non-mobile 1.6 cm stone in the gallbladder neck. No wall thickening. No sonographic Murphy sign noted by sonographer. Common bile duct: Diameter: 5 mm Liver: No focal lesion identified. Within normal limits in parenchymal echogenicity. Portal vein is patent on color Doppler imaging with normal direction of blood flow towards the liver. IMPRESSION: 1. Cholelithiasis without evidence of acute cholecystitis. No biliary dilatation. 2. Unremarkable appearance of the liver. Electronically Signed   By: Logan Bores M.D.   On: 04/29/2017 08:49        Scheduled Meds:  amLODipine  5 mg Oral Daily   aspirin  81 mg Oral Daily   enoxaparin (LOVENOX) injection  40 mg Subcutaneous QHS   hydrochlorothiazide  25 mg Oral Daily   mometasone-formoterol  2 puff Inhalation BID   pneumococcal 23 valent vaccine  0.5 mL Intramuscular Tomorrow-1000   sodium chloride flush  3 mL Intravenous Q12H   Continuous Infusions:  sodium chloride     sodium chloride 1 mL/kg/hr  (04/30/17 0956)     LOS: 0 days    Time spent: 35 minutes.     Elmarie Shiley, MD [facility] Pager [phone]  If 7PM-7AM, please contact night-coverage [email or web address] Password [facility] 04/30/2017, 3:19 PM

## 2017-04-30 NOTE — Progress Notes (Signed)
Continues to say she just doesn't feel good. Nauseated, chest feels like it is pounding. "I feel weird; very, very strange".

## 2017-04-30 NOTE — H&P (View-Only) (Signed)
Progress Note  Patient Name: Traci Johnson Date of Encounter: 04/30/2017  Primary Cardiologist: Dr. Bronson Ing (new)   Subjective   No new chest pain since yesterday.  Inpatient Medications    Scheduled Meds:  amLODipine  5 mg Oral Daily   aspirin  81 mg Oral Daily   enoxaparin (LOVENOX) injection  40 mg Subcutaneous QHS   hydrochlorothiazide  25 mg Oral Daily   mometasone-formoterol  2 puff Inhalation BID   pneumococcal 23 valent vaccine  0.5 mL Intramuscular Tomorrow-1000   sodium chloride flush  3 mL Intravenous Q12H   Continuous Infusions:  sodium chloride     sodium chloride 1 mL/kg/hr (04/30/17 0956)   PRN Meds: sodium chloride, acetaminophen, albuterol, cyclobenzaprine, diphenhydrAMINE, gi cocktail, morphine injection, nitroGLYCERIN, ondansetron (ZOFRAN) IV, sodium chloride, sodium chloride flush   Vital Signs    Vitals:   04/30/17 0544 04/30/17 0744 04/30/17 0832 04/30/17 1226  BP: 140/79 126/80  140/73  Pulse: 94 85  84  Resp: 18 18  18   Temp: (!) 97.4 F (36.3 C) 97.9 F (36.6 C)  98 F (36.7 C)  TempSrc: Oral Oral  Oral  SpO2: 95% 94% 95% 96%  Weight: 183 lb 8 oz (83.2 kg)     Height:        Intake/Output Summary (Last 24 hours) at 04/30/2017 1343 Last data filed at 04/30/2017 1227 Gross per 24 hour  Intake 240 ml  Output 1170 ml  Net -930 ml   Filed Weights   04/28/17 0036 04/29/17 0446 04/30/17 0544  Weight: 191 lb 3.2 oz (86.7 kg) 185 lb 11.2 oz (84.2 kg) 183 lb 8 oz (83.2 kg)    Telemetry    Normal sinus rhythm- Personally Reviewed  ECG    Normal sinus rhythm, generalized low voltage, otherwise normal without repolarization changes - Personally Reviewed  Physical Exam  Overweight GEN: No acute distress.   Neck: No JVD Cardiac: RRR, no murmurs, rubs, or gallops.  Respiratory: Clear to auscultation bilaterally. GI: Soft, nontender, non-distended  MS: No edema; No deformity. Neuro:  Nonfocal  Psych: Normal affect    Labs    Chemistry Recent Labs  Lab 04/27/17 2040 04/28/17 1338 04/29/17 0415 04/29/17 0833  NA 136 137  --  136  K 3.3* 3.4*  --  3.9  CL 103 107  --  107  CO2 26 23  --  24  GLUCOSE 139* 118*  --  158*  BUN 16 10  --  13  CREATININE 0.82 0.54  --  0.63  CALCIUM 9.2 9.4  --  9.5  PROT  --  6.3* 5.6*  --   ALBUMIN  --  3.2* 2.8*  --   AST  --  67* 35  --   ALT  --  67* 49  --   ALKPHOS  --  108 94  --   BILITOT  --  0.8 0.5  --   GFRNONAA >60 >60  --  >60  GFRAA >60 >60  --  >60  ANIONGAP 7 7  --  5     Hematology Recent Labs  Lab 04/27/17 2040 04/28/17 1338  WBC 12.3* 10.0  RBC 4.05 4.37  HGB 11.7* 12.6  HCT 35.4* 38.8  MCV 87.4 88.8  MCH 28.9 28.8  MCHC 33.1 32.5  RDW 12.9 13.4  PLT 300 312    Cardiac Enzymes Recent Labs  Lab 04/27/17 2317 04/28/17 0527 04/28/17 1338  TROPONINI <0.03 <0.03 <0.03  Recent Labs  Lab 04/27/17 2052  TROPIPOC 0.00     BNPNo results for input(s): BNP, PROBNP in the last 168 hours.   DDimer No results for input(s): DDIMER in the last 168 hours.   Radiology    US Abdomen Limited Ruq  Result Date: 04/29/2017 CLINICAL DATA:  Abnormal transaminases. EXAM: ULTRASOUND ABDOMEN LIMITED RIGHT UPPER QUADRANT COMPARISON:  None. FINDINGS: Gallbladder: Multiple gallstones, including a non-mobile 1.6 cm stone in the gallbladder neck. No wall thickening. No sonographic Murphy sign noted by sonographer. Common bile duct: Diameter: 5 mm Liver: No focal lesion identified. Within normal limits in parenchymal echogenicity. Portal vein is patent on color Doppler imaging with normal direction of blood flow towards the liver. IMPRESSION: 1. Cholelithiasis without evidence of acute cholecystitis. No biliary dilatation. 2. Unremarkable appearance of the liver. Electronically Signed   By: Logan Bores M.D.   On: 04/29/2017 08:49    Cardiac Studies   Nuclear stress test 04/28/2017 1. Reversibility in the lateral wall and inferolateral  wall within the mid and basal segments. Findings are concerning for ischemia in these areas.  2. Fixed defect involving the septal wall with normal wall motion in this area. Not clear if this is related to a prior infarct based on the normal wall motion. In addition, difficult to exclude peri-infarct ischemia involving the septal wall.  3. Left ventricular ejection fraction is 71%.  4. Non invasive risk stratification*: Intermediate  Patient Profile     66 y.o. female with limited coronary risk factors (hypertension), asthma, hospitalized for new onset chest pain and found to have an abnormal nuclear stress test.  Assessment & Plan    Plan for coronary angiography today.  She is currently asymptomatic.  I have reviewed her nuclear images myself and find them very difficult to understand.  There was a very large and very severe septal defect with normal regional wall motion: suspect a processing error.  The inferolateral defect is not impressive either.  If she indeed has a "false positive" study and has no coronary blockages, may be ready for discharge late this evening.  For questions or updates, please contact [place] Please consult [email or web address] for contact info under Cardiology/STEMI.      Signed, Sanda Klein, MD  04/30/2017, 1:43 PM

## 2017-05-01 ENCOUNTER — Other Ambulatory Visit: Payer: Self-pay

## 2017-05-01 ENCOUNTER — Emergency Department (HOSPITAL_COMMUNITY)
Admission: EM | Admit: 2017-05-01 | Discharge: 2017-05-02 | Disposition: A | Discharge status: Home / Self Care | Payer: BLUE CROSS/BLUE SHIELD | Source: Home / Self Care

## 2017-05-01 ENCOUNTER — Encounter (HOSPITAL_COMMUNITY): Payer: Self-pay | Admitting: Cardiovascular Disease

## 2017-05-01 DIAGNOSIS — K8000 Calculus of gallbladder with acute cholecystitis without obstruction: Secondary | ICD-10-CM | POA: Diagnosis not present

## 2017-05-01 DIAGNOSIS — K8020 Calculus of gallbladder without cholecystitis without obstruction: Secondary | ICD-10-CM | POA: Diagnosis not present

## 2017-05-01 DIAGNOSIS — I10 Essential (primary) hypertension: Secondary | ICD-10-CM | POA: Diagnosis not present

## 2017-05-01 DIAGNOSIS — R072 Precordial pain: Secondary | ICD-10-CM

## 2017-05-01 DIAGNOSIS — R109 Unspecified abdominal pain: Secondary | ICD-10-CM | POA: Insufficient documentation

## 2017-05-01 DIAGNOSIS — Z7982 Long term (current) use of aspirin: Secondary | ICD-10-CM | POA: Diagnosis not present

## 2017-05-01 DIAGNOSIS — D649 Anemia, unspecified: Secondary | ICD-10-CM | POA: Diagnosis not present

## 2017-05-01 DIAGNOSIS — G43909 Migraine, unspecified, not intractable, without status migrainosus: Secondary | ICD-10-CM | POA: Diagnosis not present

## 2017-05-01 DIAGNOSIS — K580 Irritable bowel syndrome with diarrhea: Secondary | ICD-10-CM | POA: Diagnosis not present

## 2017-05-01 DIAGNOSIS — Z885 Allergy status to narcotic agent status: Secondary | ICD-10-CM | POA: Diagnosis not present

## 2017-05-01 DIAGNOSIS — Z791 Long term (current) use of non-steroidal anti-inflammatories (NSAID): Secondary | ICD-10-CM | POA: Diagnosis not present

## 2017-05-01 DIAGNOSIS — I951 Orthostatic hypotension: Secondary | ICD-10-CM | POA: Diagnosis not present

## 2017-05-01 DIAGNOSIS — K8010 Calculus of gallbladder with chronic cholecystitis without obstruction: Secondary | ICD-10-CM | POA: Diagnosis not present

## 2017-05-01 DIAGNOSIS — Z5321 Procedure and treatment not carried out due to patient leaving prior to being seen by health care provider: Secondary | ICD-10-CM | POA: Insufficient documentation

## 2017-05-01 DIAGNOSIS — K449 Diaphragmatic hernia without obstruction or gangrene: Secondary | ICD-10-CM | POA: Diagnosis not present

## 2017-05-01 DIAGNOSIS — J45909 Unspecified asthma, uncomplicated: Secondary | ICD-10-CM | POA: Diagnosis not present

## 2017-05-01 DIAGNOSIS — R55 Syncope and collapse: Secondary | ICD-10-CM | POA: Diagnosis not present

## 2017-05-01 DIAGNOSIS — Z888 Allergy status to other drugs, medicaments and biological substances status: Secondary | ICD-10-CM | POA: Diagnosis not present

## 2017-05-01 DIAGNOSIS — D539 Nutritional anemia, unspecified: Secondary | ICD-10-CM | POA: Diagnosis not present

## 2017-05-01 DIAGNOSIS — R1011 Right upper quadrant pain: Secondary | ICD-10-CM | POA: Diagnosis not present

## 2017-05-01 DIAGNOSIS — R079 Chest pain, unspecified: Secondary | ICD-10-CM | POA: Diagnosis not present

## 2017-05-01 LAB — BASIC METABOLIC PANEL
Anion gap: 8 (ref 5–15)
BUN: 15 mg/dL (ref 6–20)
CO2: 22 mmol/L (ref 22–32)
Calcium: 9.9 mg/dL (ref 8.9–10.3)
Chloride: 106 mmol/L (ref 101–111)
Creatinine, Ser: 0.67 mg/dL (ref 0.44–1.00)
GLUCOSE: 158 mg/dL — AB (ref 65–99)
Potassium: 4 mmol/L (ref 3.5–5.1)
SODIUM: 136 mmol/L (ref 135–145)

## 2017-05-01 LAB — CBC
HEMATOCRIT: 37.8 % (ref 36.0–46.0)
Hemoglobin: 12.9 g/dL (ref 12.0–15.0)
MCH: 29.3 pg (ref 26.0–34.0)
MCHC: 34.1 g/dL (ref 30.0–36.0)
MCV: 85.7 fL (ref 78.0–100.0)
Platelets: 318 10*3/uL (ref 150–400)
RBC: 4.41 MIL/uL (ref 3.87–5.11)
RDW: 12.6 % (ref 11.5–15.5)
WBC: 12.4 10*3/uL — ABNORMAL HIGH (ref 4.0–10.5)

## 2017-05-01 NOTE — Progress Notes (Signed)
Progress Note  Patient Name: Traci Johnson Date of Encounter: 05/01/2017  Primary Cardiologist: Bronson Ing  Subjective   No chest complaints at this time.  Had a protracted episode following radial artery puncture that has resolved.  She does have a history of previous syncope associated with nausea.  Inpatient Medications    Scheduled Meds:  aspirin  81 mg Oral Daily   mometasone-formoterol  2 puff Inhalation BID   sodium chloride flush  3 mL Intravenous Q12H   Continuous Infusions:  sodium chloride 250 mL (05/01/17 0320)   PRN Meds: sodium chloride, acetaminophen, albuterol, cyclobenzaprine, diphenhydrAMINE, gi cocktail, morphine injection, nitroGLYCERIN, ondansetron (ZOFRAN) IV, sodium chloride, sodium chloride flush   Vital Signs    Vitals:   05/01/17 0323 05/01/17 0600 05/01/17 0617 05/01/17 0806  BP: 121/71 113/72  130/74  Pulse: 90 85  86  Resp: 14 16  (!) 28  Temp: 98 F (36.7 C)   97.7 F (36.5 C)  TempSrc: Oral   Oral  SpO2: 94% 94%  95%  Weight:   184 lb 14.4 oz (83.9 kg)   Height:        Intake/Output Summary (Last 24 hours) at 05/01/2017 1127 Last data filed at 05/01/2017 0400 Gross per 24 hour  Intake 369.95 ml  Output 420 ml  Net -50.05 ml   Filed Weights   04/29/17 0446 04/30/17 0544 05/01/17 0617  Weight: 185 lb 11.2 oz (84.2 kg) 183 lb 8 oz (83.2 kg) 184 lb 14.4 oz (83.9 kg)    Telemetry    Normal sinus rhythm- Personally Reviewed  ECG    Normal sinus rhythm- Personally Reviewed  Physical Exam  Obese GEN: No acute distress.   Neck: No JVD Cardiac: RRR, no murmurs, rubs, or gallops.  Respiratory: Clear to auscultation bilaterally. GI: Soft, nontender, non-distended  MS: No edema; No deformity.  Small ecchymosis/tiny hematoma at the radial access site. Neuro:  Nonfocal  Psych: Normal affect   Labs    Chemistry Recent Labs  Lab 04/28/17 1338 04/29/17 0415 04/29/17 0833 05/01/17 0237  NA 137  --  136 136  K 3.4*   --  3.9 4.0  CL 107  --  107 106  CO2 23  --  24 22  GLUCOSE 118*  --  158* 158*  BUN 10  --  13 15  CREATININE 0.54  --  0.63 0.67  CALCIUM 9.4  --  9.5 9.9  PROT 6.3* 5.6*  --   --   ALBUMIN 3.2* 2.8*  --   --   AST 67* 35  --   --   ALT 67* 49  --   --   ALKPHOS 108 94  --   --   BILITOT 0.8 0.5  --   --   GFRNONAA >60  --  >60 >60  GFRAA >60  --  >60 >60  ANIONGAP 7  --  5 8     Hematology Recent Labs  Lab 04/27/17 2040 04/28/17 1338 05/01/17 0237  WBC 12.3* 10.0 12.4*  RBC 4.05 4.37 4.41  HGB 11.7* 12.6 12.9  HCT 35.4* 38.8 37.8  MCV 87.4 88.8 85.7  MCH 28.9 28.8 29.3  MCHC 33.1 32.5 34.1  RDW 12.9 13.4 12.6  PLT 300 312 318    Cardiac Enzymes Recent Labs  Lab 04/27/17 2317 04/28/17 0527 04/28/17 1338  TROPONINI <0.03 <0.03 <0.03    Recent Labs  Lab 04/27/17 2052  TROPIPOC 0.00     BNPNo  results for input(s): BNP, PROBNP in the last 168 hours.   DDimer No results for input(s): DDIMER in the last 168 hours.   Radiology    No results found.  Cardiac Studies   04/30/2017 CATH IMPRESSION: Traci Johnson has essentially normal coronary arteries and normal LV function. I believe her chest pain was noncardiac and her Myoview stress test also positive. She did have a profound vagal response which responded to typical vasoactive agents. Because of her prolonged low blood pressure on dopamine she will be placed in the stepdown unit overnight and can be discharged home in the morning.  Patient Profile     66 y.o. female with limited risk factors for coronary artery disease and atypical chest pain had a nuclear study that showed a large defect, found to be a "false positive" after coronary angiography.  No evidence of structural heart disease.  Assessment & Plan   Atypical chest pain, possibly GI etiology. Consider esophageal spasm. Vasovagal syncope, triggered by arteriotomy and with previous episodes triggered by nausea. HTN, well treated.  Worth  considering stopping her diuretic and vasodilator using beta-blockers for hypertension, since this may lessen the likelihood of future syncope.  However, understand that it has been difficult to find medications that would not worsen her cluster headaches and the syncopal events are very infrequent.  Adjustments can be made as an outpatient.  For questions or updates, please contact [place] Please consult [email or web address] for contact info under Cardiology/STEMI.      Signed, Sanda Klein, MD  05/01/2017, 11:27 AM

## 2017-05-01 NOTE — Discharge Summary (Addendum)
Physician Discharge Summary  Traci Johnson QMG:[id number] DOB: 1950/11/10 DOA: 04/27/2017  PCP: Celene Squibb, MD  Admit date: 04/27/2017 Discharge date: 05/01/2017  Admitted From: Home  Disposition: Home   Recommendations for Outpatient Follow-up:  1. Follow up with PCP in 1-2 weeks 2. Please obtain BMP/CBC in one week 3. Needs follow up with GI for cholelithiasis. Needs repeat LFT    Discharge Condition: stable.  CODE STATUS: full code.  Diet recommendation: Heart Healthy   Brief/Interim Summary: Traci Johnson a 66 y.o.femalewith medical history significant ofasthma and HTN presenting with acute onset of chest pain. It felt like she had an elephant sitting on her chest with stabbing pains in her back. Symptoms started about 6:30 pm. She was sitting on the sofa. It "came on with a bang"and they went straight to the urgent care in [facility] and they sent her here by ambulance. The pain finally resolved with the 4th NTG pill - it did get better with the otherNTG in that the pain was gone but she still had a lot of pressure. The 4th pill resolved the pressure but also bottomed her BP out. No associated symptoms. No h/o chest pain, never had a stress test.     Assessment & Plan:   Principal Problem:   Chest pain Active Problems:   Essential hypertension   Asthma, chronic   Hyperglycemia   Anemia   1-Chest pain; enzymes negative.  Stress test Intermediate risk.  Underwent cath which showed normal CA. Patient develops vaso-vagal respond post cath,. She became hypotension. She was started on dopamine.  She has been off dopamine. Her BP has been stable. She feels well.  Plan to hold BP medication at discharge.   2-Mild transaminases; resolved Hepatitis pane negative Liver US cholelithiasis. Follow up  with GI outpatient.   HTN; continue with current medications.   Hypokalemia resolved   Hyperglycemia -\follow Hb A1c.   Asthma -Continue home  Albuterol and Advair (formulary substitution for Dulera)  Anemia -hb stable.   Headaches; improved Morphine IV PRN    Discharge Diagnoses:  Principal Problem:   Chest pain Active Problems:   Essential hypertension   Asthma, chronic   Hyperglycemia   Anemia   Abnormal nuclear stress test    Discharge Instructions  Discharge Instructions    Diet - low sodium heart healthy   Complete by:  As directed    Increase activity slowly   Complete by:  As directed      Allergies as of 05/01/2017      Reactions   Codeine Nausea And Vomiting   Lisinopril    cough      Medication List    STOP taking these medications   amLODipine 5 MG tablet Commonly known as:  NORVASC   hydrochlorothiazide 25 MG tablet Commonly known as:  HYDRODIURIL   phentermine 37.5 MG capsule     TAKE these medications   albuterol 108 (90 Base) MCG/ACT inhaler Commonly known as:  PROVENTIL HFA;VENTOLIN HFA Inhale 2 puffs into the lungs every 6 (six) hours as needed for wheezing.   aspirin 81 MG tablet Take 81 mg by mouth daily.   cholecalciferol 1000 units tablet Commonly known as:  VITAMIN D Take 5,000 Units by mouth daily.   cyclobenzaprine 5 MG tablet Commonly known as:  FLEXERIL TAKE ONE TABLET BY MOUTH THREE TIMES DAILY AS NEEDED FOR MUSCLE SPASM   estradiol 1 MG tablet Commonly known as:  ESTRACE Take 0.5 mg by mouth daily.  Fluticasone-Salmeterol 100-50 MCG/DOSE Aepb Commonly known as:  ADVAIR DISKUS INHALE ONE DOSE BY MOUTH EVERY 12 HOURS   meloxicam 15 MG tablet Commonly known as:  MOBIC TAKE ONE TABLET BY MOUTH ONCE DAILY   SUMAtriptan 6 MG/0.5ML Soln injection Commonly known as:  IMITREX Inject 6 mg into the skin every 2 (two) hours as needed for migraine or headache. F   vitamin B-12 1000 MCG tablet Commonly known as:  CYANOCOBALAMIN Take 1,000 mcg by mouth daily.   vitamin E 400 UNIT capsule Generic drug:  vitamin E Take 400 Units by mouth daily.        Allergies  Allergen Reactions   Codeine Nausea And Vomiting   Lisinopril     cough    Consultations:  Cardiology    Procedures/Studies: Dg Chest 2 View  Result Date: 04/27/2017 CLINICAL DATA:  66 year old female with chest pain and shortness of breath. EXAM: CHEST  2 VIEW COMPARISON:  None. FINDINGS: The heart size and mediastinal contours are within normal limits. Mild left basilar atelectasis.  Both lungs are otherwise clear. The visualized skeletal structures are unremarkable. IMPRESSION: No active cardiopulmonary disease. Electronically Signed   By: Kristopher Oppenheim M.D.   On: 04/27/2017 21:11   Nm Myocar Multi W/spect W/wall Motion / Ef  Result Date: 04/28/2017 CLINICAL DATA:  66 year old with chest pain and palpitations. Intermediate to high probability. EXAM: MYOCARDIAL IMAGING WITH SPECT (REST AND PHARMACOLOGIC-STRESS) GATED LEFT VENTRICULAR WALL MOTION STUDY LEFT VENTRICULAR EJECTION FRACTION TECHNIQUE: Standard myocardial SPECT imaging was performed after resting intravenous injection of 10 mCi Tc-3m tetrofosmin. Subsequently, intravenous infusion of Lexiscan was performed under the supervision of the Cardiology staff. At peak effect of the drug, 30 mCi Tc-47m tetrofosmin was injected intravenously and standard myocardial SPECT imaging was performed. Quantitative gated imaging was also performed to evaluate left ventricular wall motion, and estimate left ventricular ejection fraction. COMPARISON:  None. FINDINGS: Perfusion: No significant uptake along the septal wall on both the rest and stress images. There is moderate reversibility in the lateral wall and inferolateral wall in the mid and basal segments. Findings along the lateral wall are concerning for ischemia. Wall Motion: Normal left ventricular wall motion. In particular, there appears to be normal motion in the septal wall. No left ventricular dilation. Left Ventricular Ejection Fraction: 71 % End diastolic volume 58 ml  End systolic volume 17 ml IMPRESSION: 1. Reversibility in the lateral wall and inferolateral wall within the mid and basal segments. Findings are concerning for ischemia in these areas. 2. Fixed defect involving the septal wall with normal wall motion in this area. Not clear if this is related to a prior infarct based on the normal wall motion. In addition, difficult to exclude peri-infarct ischemia involving the septal wall. 3. Left ventricular ejection fraction is 71%. 4. Non invasive risk stratification*: Intermediate *2012 Appropriate Use Criteria for Coronary Revascularization Focused Update: J Am Coll Cardiol. 9629;52(8):413-244. http://content.airportbarriers.com.aspx?articleid=[email or web address] Electronically Signed   By: Markus Daft M.D.   On: 04/28/2017 15:47   US Abdomen Limited Ruq  Result Date: 04/29/2017 CLINICAL DATA:  Abnormal transaminases. EXAM: ULTRASOUND ABDOMEN LIMITED RIGHT UPPER QUADRANT COMPARISON:  None. FINDINGS: Gallbladder: Multiple gallstones, including a non-mobile 1.6 cm stone in the gallbladder neck. No wall thickening. No sonographic Murphy sign noted by sonographer. Common bile duct: Diameter: 5 mm Liver: No focal lesion identified. Within normal limits in parenchymal echogenicity. Portal vein is patent on color Doppler imaging with normal direction of blood flow towards the liver. IMPRESSION: 1.  Cholelithiasis without evidence of acute cholecystitis. No biliary dilatation. 2. Unremarkable appearance of the liver. Electronically Signed   By: Logan Bores M.D.   On: 04/29/2017 08:49       Subjective: She is feeling well, denies chest pain   Discharge Exam: Vitals:   05/01/17 0600 05/01/17 0806  BP: 113/72 130/74  Pulse: 85 86  Resp: 16 (!) 28  Temp:  97.7 F (36.5 C)  SpO2: 94% 95%   Vitals:   05/01/17 0323 05/01/17 0600 05/01/17 0617 05/01/17 0806  BP: 121/71 113/72  130/74  Pulse: 90 85  86  Resp: 14 16  (!) 28  Temp: 98 F (36.7 C)   97.7 F (36.5 C)   TempSrc: Oral   Oral  SpO2: 94% 94%  95%  Weight:   83.9 kg (184 lb 14.4 oz)   Height:        General: Pt is alert, awake, not in acute distress Cardiovascular: RRR, S1/S2 +, no rubs, no gallops Respiratory: CTA bilaterally, no wheezing, no rhonchi Abdominal: Soft, NT, ND, bowel sounds + Extremities: no edema, no cyanosis    The results of significant diagnostics from this hospitalization (including imaging, microbiology, ancillary and laboratory) are listed below for reference.     Microbiology: Recent Results (from the past 240 hour(s))  Surgical pcr screen     Status: None   Collection Time: 04/29/17  8:20 PM  Result Value Ref Range Status   MRSA, PCR NEGATIVE NEGATIVE Final   Staphylococcus aureus NEGATIVE NEGATIVE Final    Comment: (NOTE) The Xpert SA Assay (FDA approved for NASAL specimens in patients 32 years of age and older), is one component of a comprehensive surveillance program. It is not intended to diagnose infection nor to guide or monitor treatment.      Labs: BNP (last 3 results) No results for input(s): BNP in the last 8760 hours. Basic Metabolic Panel: Recent Labs  Lab 04/27/17 2040 04/28/17 1338 04/29/17 0833 05/01/17 0237  NA 136 137 136 136  K 3.3* 3.4* 3.9 4.0  CL 103 107 107 106  CO2 26 23 24 22   GLUCOSE 139* 118* 158* 158*  BUN 16 10 13 15   CREATININE 0.82 0.54 0.63 0.67  CALCIUM 9.2 9.4 9.5 9.9   Liver Function Tests: Recent Labs  Lab 04/28/17 1338 04/29/17 0415  AST 67* 35  ALT 67* 49  ALKPHOS 108 94  BILITOT 0.8 0.5  PROT 6.3* 5.6*  ALBUMIN 3.2* 2.8*   Recent Labs  Lab 04/28/17 1338  LIPASE 35   No results for input(s): AMMONIA in the last 168 hours. CBC: Recent Labs  Lab 04/27/17 2040 04/28/17 1338 05/01/17 0237  WBC 12.3* 10.0 12.4*  HGB 11.7* 12.6 12.9  HCT 35.4* 38.8 37.8  MCV 87.4 88.8 85.7  PLT 300 312 318   Cardiac Enzymes: Recent Labs  Lab 04/27/17 2317 04/28/17 0527 04/28/17 1338  TROPONINI  <0.03 <0.03 <0.03   BNP: Invalid input(s): POCBNP CBG: No results for input(s): GLUCAP in the last 168 hours. D-Dimer No results for input(s): DDIMER in the last 72 hours. Hgb A1c No results for input(s): HGBA1C in the last 72 hours. Lipid Profile No results for input(s): CHOL, HDL, LDLCALC, TRIG, CHOLHDL, LDLDIRECT in the last 72 hours. Thyroid function studies No results for input(s): TSH, T4TOTAL, T3FREE, THYROIDAB in the last 72 hours.  Invalid input(s): FREET3 Anemia work up No results for input(s): VITAMINB12, FOLATE, FERRITIN, TIBC, IRON, RETICCTPCT in the last 72 hours.  Urinalysis No results found for: COLORURINE, APPEARANCEUR, [place], [place], [place], [place], [place], [place], PROTEINUR, UROBILINOGEN, NITRITE, LEUKOCYTESUR Sepsis Labs Invalid input(s): PROCALCITONIN,  WBC,  LACTICIDVEN Microbiology Recent Results (from the past 240 hour(s))  Surgical pcr screen     Status: None   Collection Time: 04/29/17  8:20 PM  Result Value Ref Range Status   MRSA, PCR NEGATIVE NEGATIVE Final   Staphylococcus aureus NEGATIVE NEGATIVE Final    Comment: (NOTE) The Xpert SA Assay (FDA approved for NASAL specimens in patients 39 years of age and older), is one component of a comprehensive surveillance program. It is not intended to diagnose infection nor to guide or monitor treatment.      Time coordinating discharge: Over 30 minutes  SIGNED:   Elmarie Shiley, MD  [facility] 05/01/2017, 8:48 AM Pager   If 7PM-7AM, please contact night-coverage [email or web address] Password [email or web address]

## 2017-05-01 NOTE — Discharge Instructions (Signed)
Nonspecific Chest Pain °Chest pain can be caused by many different conditions. There is always a chance that your pain could be related to something serious, such as a heart attack or a blood clot in your lungs. Chest pain can also be caused by conditions that are not life-threatening. If you have chest pain, it is very important to follow up with your health care provider. °What are the causes? °Causes of this condition include: °· Heartburn. °· Pneumonia or bronchitis. °· Anxiety or stress. °· Inflammation around your heart (pericarditis) or lung (pleuritis or pleurisy). °· A blood clot in your lung. °· A collapsed lung (pneumothorax). This can develop suddenly on its own (spontaneous pneumothorax) or from trauma to the chest. °· Shingles infection (varicella-zoster virus). °· Heart attack. °· Damage to the bones, muscles, and cartilage that make up your chest wall. This can include: °? Bruised bones due to injury. °? Strained muscles or cartilage due to frequent or repeated coughing or overwork. °? Fracture to one or more ribs. °? Sore cartilage due to inflammation (costochondritis). ° °What increases the risk? °Risk factors for this condition may include: °· Activities that increase your risk for trauma or injury to your chest. °· Respiratory infections or conditions that cause frequent coughing. °· Medical conditions or overeating that can cause heartburn. °· Heart disease or family history of heart disease. °· Conditions or health behaviors that increase your risk of developing a blood clot. °· Having had chicken pox (varicella zoster). ° °What are the signs or symptoms? °Chest pain can feel like: °· Burning or tingling on the surface of your chest or deep in your chest. °· Crushing, pressure, aching, or squeezing pain. °· Dull or sharp pain that is worse when you move, cough, or take a deep breath. °· Pain that is also felt in your back, neck, shoulder, or arm, or pain that spreads to any of these  areas. ° °Your chest pain may come and go, or it may stay constant. °How is this diagnosed? °Lab tests or other studies may be needed to find the cause of your pain. Your health care provider may have you take a test called an ECG (electrocardiogram). An ECG records your heartbeat patterns at the time the test is performed. You may also have other tests, such as: °· Transthoracic echocardiogram (TTE). In this test, sound waves are used to create a picture of the heart structures and to look at how blood flows through your heart. °· Transesophageal echocardiogram (TEE). This is a more advanced imaging test that takes images from inside your body. It allows your health care provider to see your heart in finer detail. °· Cardiac monitoring. This allows your health care provider to monitor your heart rate and rhythm in real time. °· Holter monitor. This is a portable device that records your heartbeat and can help to diagnose abnormal heartbeats. It allows your health care provider to track your heart activity for several days, if needed. °· Stress tests. These can be done through exercise or by taking medicine that makes your heart beat more quickly. °· Blood tests. °· Other imaging tests. ° °How is this treated? °Treatment depends on what is causing your chest pain. Treatment may include: °· Medicines. These may include: °? Acid blockers for heartburn. °? Anti-inflammatory medicine. °? Pain medicine for inflammatory conditions. °? Antibiotic medicine, if an infection is present. °? Medicines to dissolve blood clots. °? Medicines to treat coronary artery disease (CAD). °· Supportive care for conditions that   do not require medicines. This may include: °? Resting. °? Applying heat or cold packs to injured areas. °? Limiting activities until pain decreases. ° °Follow these instructions at home: °Medicines °· If you were prescribed an antibiotic, take it as told by your health care provider. Do not stop taking the  antibiotic even if you start to feel better. °· Take over-the-counter and prescription medicines only as told by your health care provider. °Lifestyle °· Do not use any products that contain nicotine or tobacco, such as cigarettes and e-cigarettes. If you need help quitting, ask your health care provider. °· Do not drink alcohol. °· Make lifestyle changes as directed by your health care provider. These may include: °? Getting regular exercise. Ask your health care provider to suggest some activities that are safe for you. °? Eating a heart-healthy diet. A registered dietitian can help you to learn healthy eating options. °? Maintaining a healthy weight. °? Managing diabetes, if necessary. °? Reducing stress, such as with yoga or relaxation techniques. °General instructions °· Avoid any activities that bring on chest pain. °· If heartburn is the cause for your chest pain, raise (elevate) the head of your bed about 6 inches (15 cm) by putting blocks under the legs. Sleeping with more pillows does not effectively relieve heartburn because it only changes the position of your head. °· Keep all follow-up visits as told by your health care provider. This is important. This includes any further testing if your chest pain does not go away. °Contact a health care provider if: °· Your chest pain does not go away. °· You have a rash with blisters on your chest. °· You have a fever. °· You have chills. °Get help right away if: °· Your chest pain is worse. °· You have a cough that gets worse, or you cough up blood. °· You have severe pain in your abdomen. °· You have severe weakness. °· You faint. °· You have sudden, unexplained chest discomfort. °· You have sudden, unexplained discomfort in your arms, back, neck, or jaw. °· You have shortness of breath at any time. °· You suddenly start to sweat, or your skin gets clammy. °· You feel nauseous or you vomit. °· You suddenly feel light-headed or dizzy. °· Your heart begins to beat  quickly, or it feels like it is skipping beats. °These symptoms may represent a serious problem that is an emergency. Do not wait to see if the symptoms will go away. Get medical help right away. Call your local emergency services (911 in the [place] Do not drive yourself to the hospital. °This information is not intended to replace advice given to you by your health care provider. Make sure you discuss any questions you have with your health care provider. °Document Released: 03/01/2005 Document Revised: 02/14/2016 Document Reviewed: 02/14/2016 °Elsevier Interactive Patient Education © 2017 [employer] ° °

## 2017-05-01 NOTE — ED Triage Notes (Signed)
Pt with abd pain since eating a hamburger at 1330.  Pt just d/c'd from [facility] at [facility] today for CP, cardiac work up done inc. Stress test and heart cath.  Did find pt with gallstones.  Pt with N/v as well.

## 2017-05-01 NOTE — Progress Notes (Signed)
Patient discharged home with husband- IV removed and belongings packed. Wheeled to front entrance.

## 2017-05-02 ENCOUNTER — Other Ambulatory Visit: Payer: Self-pay

## 2017-05-02 ENCOUNTER — Emergency Department (HOSPITAL_BASED_OUTPATIENT_CLINIC_OR_DEPARTMENT_OTHER): Payer: BLUE CROSS/BLUE SHIELD

## 2017-05-02 ENCOUNTER — Encounter (HOSPITAL_BASED_OUTPATIENT_CLINIC_OR_DEPARTMENT_OTHER): Payer: Self-pay | Admitting: Emergency Medicine

## 2017-05-02 ENCOUNTER — Observation Stay (HOSPITAL_COMMUNITY): Payer: BLUE CROSS/BLUE SHIELD | Admitting: Registered Nurse

## 2017-05-02 ENCOUNTER — Observation Stay (HOSPITAL_BASED_OUTPATIENT_CLINIC_OR_DEPARTMENT_OTHER)
Admission: EM | Admit: 2017-05-02 | Discharge: 2017-05-04 | Disposition: A | Discharge status: Home / Self Care | Payer: BLUE CROSS/BLUE SHIELD | Attending: General Surgery | Admitting: General Surgery

## 2017-05-02 ENCOUNTER — Encounter (HOSPITAL_COMMUNITY)
Admission: EM | Disposition: A | Discharge status: Home / Self Care | Payer: Self-pay | Source: Home / Self Care | Attending: Emergency Medicine

## 2017-05-02 DIAGNOSIS — D539 Nutritional anemia, unspecified: Secondary | ICD-10-CM | POA: Insufficient documentation

## 2017-05-02 DIAGNOSIS — K8020 Calculus of gallbladder without cholecystitis without obstruction: Secondary | ICD-10-CM | POA: Diagnosis not present

## 2017-05-02 DIAGNOSIS — K8010 Calculus of gallbladder with chronic cholecystitis without obstruction: Principal | ICD-10-CM | POA: Insufficient documentation

## 2017-05-02 DIAGNOSIS — E559 Vitamin D deficiency, unspecified: Secondary | ICD-10-CM | POA: Diagnosis not present

## 2017-05-02 DIAGNOSIS — R55 Syncope and collapse: Secondary | ICD-10-CM | POA: Insufficient documentation

## 2017-05-02 DIAGNOSIS — Z885 Allergy status to narcotic agent status: Secondary | ICD-10-CM | POA: Insufficient documentation

## 2017-05-02 DIAGNOSIS — R079 Chest pain, unspecified: Secondary | ICD-10-CM | POA: Diagnosis not present

## 2017-05-02 DIAGNOSIS — I951 Orthostatic hypotension: Secondary | ICD-10-CM | POA: Insufficient documentation

## 2017-05-02 DIAGNOSIS — K449 Diaphragmatic hernia without obstruction or gangrene: Secondary | ICD-10-CM | POA: Insufficient documentation

## 2017-05-02 DIAGNOSIS — R1011 Right upper quadrant pain: Secondary | ICD-10-CM | POA: Diagnosis not present

## 2017-05-02 DIAGNOSIS — K8000 Calculus of gallbladder with acute cholecystitis without obstruction: Secondary | ICD-10-CM | POA: Diagnosis not present

## 2017-05-02 DIAGNOSIS — D649 Anemia, unspecified: Secondary | ICD-10-CM | POA: Diagnosis not present

## 2017-05-02 DIAGNOSIS — Z6832 Body mass index (BMI) 32.0-32.9, adult: Secondary | ICD-10-CM | POA: Insufficient documentation

## 2017-05-02 DIAGNOSIS — J4530 Mild persistent asthma, uncomplicated: Secondary | ICD-10-CM | POA: Diagnosis present

## 2017-05-02 DIAGNOSIS — K810 Acute cholecystitis: Secondary | ICD-10-CM | POA: Diagnosis present

## 2017-05-02 DIAGNOSIS — J45909 Unspecified asthma, uncomplicated: Secondary | ICD-10-CM | POA: Insufficient documentation

## 2017-05-02 DIAGNOSIS — Z791 Long term (current) use of non-steroidal anti-inflammatories (NSAID): Secondary | ICD-10-CM | POA: Insufficient documentation

## 2017-05-02 DIAGNOSIS — Z7982 Long term (current) use of aspirin: Secondary | ICD-10-CM | POA: Insufficient documentation

## 2017-05-02 DIAGNOSIS — I10 Essential (primary) hypertension: Secondary | ICD-10-CM | POA: Insufficient documentation

## 2017-05-02 DIAGNOSIS — Z888 Allergy status to other drugs, medicaments and biological substances status: Secondary | ICD-10-CM | POA: Insufficient documentation

## 2017-05-02 DIAGNOSIS — K580 Irritable bowel syndrome with diarrhea: Secondary | ICD-10-CM | POA: Insufficient documentation

## 2017-05-02 DIAGNOSIS — G43909 Migraine, unspecified, not intractable, without status migrainosus: Secondary | ICD-10-CM | POA: Insufficient documentation

## 2017-05-02 DIAGNOSIS — Z79899 Other long term (current) drug therapy: Secondary | ICD-10-CM | POA: Insufficient documentation

## 2017-05-02 HISTORY — PX: CHOLECYSTECTOMY: SHX55

## 2017-05-02 LAB — URINALYSIS, MICROSCOPIC (REFLEX): BACTERIA UA: NONE SEEN

## 2017-05-02 LAB — BLOOD GAS, ARTERIAL
ACID-BASE EXCESS: 0 mmol/L (ref 0.0–2.0)
BICARBONATE: 24.3 mmol/L (ref 20.0–28.0)
DRAWN BY: 225631
O2 Content: 2 L/min
O2 Saturation: 97.4 %
PATIENT TEMPERATURE: 37
PH ART: 7.395 (ref 7.350–7.450)
pCO2 arterial: 40.5 mmHg (ref 32.0–48.0)
pO2, Arterial: 98.1 mmHg (ref 83.0–108.0)

## 2017-05-02 LAB — COMPREHENSIVE METABOLIC PANEL
ALBUMIN: 3.7 g/dL (ref 3.5–5.0)
ALK PHOS: 93 U/L (ref 38–126)
ALT: 32 U/L (ref 14–54)
ANION GAP: 8 (ref 5–15)
AST: 40 U/L (ref 15–41)
BUN: 25 mg/dL — ABNORMAL HIGH (ref 6–20)
CHLORIDE: 106 mmol/L (ref 101–111)
CO2: 23 mmol/L (ref 22–32)
CREATININE: 0.51 mg/dL (ref 0.44–1.00)
Calcium: 10 mg/dL (ref 8.9–10.3)
GFR calc non Af Amer: >60 mL/min (ref >60)
Glucose, Bld: 144 mg/dL — ABNORMAL HIGH (ref 65–99)
Potassium: 3.8 mmol/L (ref 3.5–5.1)
SODIUM: 137 mmol/L (ref 135–145)
Total Bilirubin: 0.3 mg/dL (ref 0.3–1.2)
Total Protein: 7.4 g/dL (ref 6.5–8.1)

## 2017-05-02 LAB — CBC WITH DIFFERENTIAL/PLATELET
BASOS ABS: 0 10*3/uL (ref 0.0–0.1)
Basophils Relative: 0 %
EOS ABS: 0 10*3/uL (ref 0.0–0.7)
Eosinophils Relative: 0 %
HCT: 37.5 % (ref 36.0–46.0)
HEMOGLOBIN: 12.5 g/dL (ref 12.0–15.0)
LYMPHS PCT: 10 %
Lymphs Abs: 1.8 10*3/uL (ref 0.7–4.0)
MCH: 28.8 pg (ref 26.0–34.0)
MCHC: 33.3 g/dL (ref 30.0–36.0)
MCV: 86.4 fL (ref 78.0–100.0)
Monocytes Absolute: 0.9 10*3/uL (ref 0.1–1.0)
Monocytes Relative: 5 %
NEUTROS PCT: 85 %
Neutro Abs: 14.8 10*3/uL — ABNORMAL HIGH (ref 1.7–7.7)
Platelets: 349 10*3/uL (ref 150–400)
RBC: 4.34 MIL/uL (ref 3.87–5.11)
RDW: 12.9 % (ref 11.5–15.5)
WBC: 17.5 10*3/uL — ABNORMAL HIGH (ref 4.0–10.5)

## 2017-05-02 LAB — CBC
HEMATOCRIT: 32.5 % — AB (ref 36.0–46.0)
Hemoglobin: 10.6 g/dL — ABNORMAL LOW (ref 12.0–15.0)
MCH: 28.9 pg (ref 26.0–34.0)
MCHC: 32.6 g/dL (ref 30.0–36.0)
MCV: 88.6 fL (ref 78.0–100.0)
Platelets: 305 10*3/uL (ref 150–400)
RBC: 3.67 MIL/uL — AB (ref 3.87–5.11)
RDW: 13.3 % (ref 11.5–15.5)
WBC: 14.8 10*3/uL — AB (ref 4.0–10.5)

## 2017-05-02 LAB — LIPASE, BLOOD: LIPASE: 30 U/L (ref 11–51)

## 2017-05-02 LAB — URINALYSIS, ROUTINE W REFLEX MICROSCOPIC
Bilirubin Urine: NEGATIVE
Glucose, UA: NEGATIVE mg/dL
Ketones, ur: NEGATIVE mg/dL
LEUKOCYTES UA: NEGATIVE
Nitrite: NEGATIVE
PROTEIN: NEGATIVE mg/dL
Specific Gravity, Urine: 1.01 (ref 1.005–1.030)
pH: 7.5 (ref 5.0–8.0)

## 2017-05-02 LAB — GLUCOSE, CAPILLARY: GLUCOSE-CAPILLARY: 161 mg/dL — AB (ref 65–99)

## 2017-05-02 LAB — I-STAT CG4 LACTIC ACID, ED
Lactic Acid, Venous: 2.44 mmol/L (ref 0.5–1.9)
Lactic Acid, Venous: 1.73 mmol/L (ref 0.5–1.9)

## 2017-05-02 SURGERY — LAPAROSCOPIC CHOLECYSTECTOMY
Anesthesia: General

## 2017-05-02 MED ORDER — PROPOFOL 10 MG/ML IV BOLUS
INTRAVENOUS | Status: DC | PRN
  Administered 2017-05-02: 170 mg via INTRAVENOUS

## 2017-05-02 MED ORDER — LIDOCAINE HCL (CARDIAC) 20 MG/ML IV SOLN
INTRAVENOUS | Status: DC | PRN
  Administered 2017-05-02: 25 mg via INTRATRACHEAL
  Administered 2017-05-02: 75 mg via INTRAVENOUS

## 2017-05-02 MED ORDER — MORPHINE SULFATE (PF) 2 MG/ML IV SOLN
2.0000 mg | INTRAVENOUS | Status: DC | PRN
  Administered 2017-05-02 – 2017-05-03 (×2): 2 mg via INTRAVENOUS
  Filled 2017-05-02 (×2): qty 1

## 2017-05-02 MED ORDER — HYDROMORPHONE HCL 1 MG/ML IJ SOLN
1.0000 mg | Freq: Once | INTRAMUSCULAR | Status: AC
  Administered 2017-05-02: 1 mg via INTRAVENOUS
  Filled 2017-05-02: qty 1

## 2017-05-02 MED ORDER — CYCLOBENZAPRINE HCL 5 MG PO TABS
5.0000 mg | ORAL_TABLET | Freq: Three times a day (TID) | ORAL | Status: DC | PRN
  Administered 2017-05-03 – 2017-05-04 (×2): 5 mg via ORAL
  Filled 2017-05-02 (×2): qty 1

## 2017-05-02 MED ORDER — IOPAMIDOL (ISOVUE-300) INJECTION 61%
INTRAVENOUS | Status: AC
  Filled 2017-05-02: qty 50

## 2017-05-02 MED ORDER — PROMETHAZINE HCL 25 MG/ML IJ SOLN
6.2500 mg | INTRAMUSCULAR | Status: DC | PRN
  Administered 2017-05-02: 12.5 mg via INTRAVENOUS

## 2017-05-02 MED ORDER — SUGAMMADEX SODIUM 200 MG/2ML IV SOLN
INTRAVENOUS | Status: DC | PRN
  Administered 2017-05-02: 200 mg via INTRAVENOUS

## 2017-05-02 MED ORDER — BUPIVACAINE-EPINEPHRINE (PF) 0.25% -1:200000 IJ SOLN
INTRAMUSCULAR | Status: AC
  Filled 2017-05-02: qty 30

## 2017-05-02 MED ORDER — ROCURONIUM BROMIDE 100 MG/10ML IV SOLN
INTRAVENOUS | Status: DC | PRN
  Administered 2017-05-02: 40 mg via INTRAVENOUS

## 2017-05-02 MED ORDER — ONDANSETRON HCL 4 MG/2ML IJ SOLN
INTRAMUSCULAR | Status: AC
  Administered 2017-05-02: 4 mg via INTRAVENOUS
  Filled 2017-05-02: qty 2

## 2017-05-02 MED ORDER — DEXAMETHASONE SODIUM PHOSPHATE 10 MG/ML IJ SOLN
INTRAMUSCULAR | Status: DC | PRN
  Administered 2017-05-02: 10 mg via INTRAVENOUS

## 2017-05-02 MED ORDER — FENTANYL CITRATE (PF) 250 MCG/5ML IJ SOLN
INTRAMUSCULAR | Status: AC
  Filled 2017-05-02: qty 5

## 2017-05-02 MED ORDER — DICYCLOMINE HCL 10 MG/ML IM SOLN
20.0000 mg | Freq: Once | INTRAMUSCULAR | Status: AC
  Administered 2017-05-02: 20 mg via INTRAMUSCULAR
  Filled 2017-05-02: qty 2

## 2017-05-02 MED ORDER — ONDANSETRON HCL 4 MG/2ML IJ SOLN
4.0000 mg | Freq: Once | INTRAMUSCULAR | Status: AC
  Administered 2017-05-02: 4 mg via INTRAVENOUS

## 2017-05-02 MED ORDER — BUPIVACAINE-EPINEPHRINE 0.25% -1:200000 IJ SOLN
INTRAMUSCULAR | Status: DC | PRN
  Administered 2017-05-02: 27 mL

## 2017-05-02 MED ORDER — ONDANSETRON HCL 4 MG/2ML IJ SOLN
INTRAMUSCULAR | Status: AC
  Filled 2017-05-02: qty 2

## 2017-05-02 MED ORDER — HYDROMORPHONE HCL 1 MG/ML IJ SOLN: 0.2500 mg | INTRAMUSCULAR | Status: DC | PRN

## 2017-05-02 MED ORDER — DEXTROSE 5 % IV SOLN: 2.0000 g | INTRAVENOUS | Status: DC

## 2017-05-02 MED ORDER — LACTATED RINGERS IV SOLN
INTRAVENOUS | Status: DC
  Administered 2017-05-02: 125 mL/h via INTRAVENOUS

## 2017-05-02 MED ORDER — SODIUM CHLORIDE 0.9 % IV BOLUS (SEPSIS)
1000.0000 mL | Freq: Once | INTRAVENOUS | Status: AC
  Administered 2017-05-02: 1000 mL via INTRAVENOUS

## 2017-05-02 MED ORDER — LABETALOL HCL 5 MG/ML IV SOLN
INTRAVENOUS | Status: DC | PRN
  Administered 2017-05-02 (×3): 2.5 mg via INTRAVENOUS

## 2017-05-02 MED ORDER — MIDAZOLAM HCL 2 MG/2ML IJ SOLN
INTRAMUSCULAR | Status: AC
  Filled 2017-05-02: qty 2

## 2017-05-02 MED ORDER — FENTANYL CITRATE (PF) 100 MCG/2ML IJ SOLN
50.0000 ug | Freq: Once | INTRAMUSCULAR | Status: AC
  Administered 2017-05-02: 50 ug via INTRAVENOUS
  Filled 2017-05-02: qty 2

## 2017-05-02 MED ORDER — FENTANYL CITRATE (PF) 100 MCG/2ML IJ SOLN
INTRAMUSCULAR | Status: AC
  Administered 2017-05-02: 100 ug via INTRAVENOUS
  Filled 2017-05-02: qty 2

## 2017-05-02 MED ORDER — MELOXICAM 15 MG PO TABS
15.0000 mg | ORAL_TABLET | Freq: Every day | ORAL | Status: DC
  Administered 2017-05-03 – 2017-05-04 (×2): 15 mg via ORAL
  Filled 2017-05-02 (×2): qty 1

## 2017-05-02 MED ORDER — TRAMADOL HCL 50 MG PO TABS
50.0000 mg | ORAL_TABLET | Freq: Four times a day (QID) | ORAL | Status: DC | PRN
  Administered 2017-05-03 – 2017-05-04 (×2): 50 mg via ORAL
  Filled 2017-05-02 (×2): qty 1

## 2017-05-02 MED ORDER — FENTANYL CITRATE (PF) 100 MCG/2ML IJ SOLN
100.0000 ug | Freq: Once | INTRAMUSCULAR | Status: AC
  Administered 2017-05-02: 100 ug via INTRAVENOUS
  Filled 2017-05-02: qty 2

## 2017-05-02 MED ORDER — HYDRALAZINE HCL 20 MG/ML IJ SOLN: 10.0000 mg | INTRAMUSCULAR | Status: DC | PRN

## 2017-05-02 MED ORDER — ENOXAPARIN SODIUM 40 MG/0.4ML ~~LOC~~ SOLN
40.0000 mg | SUBCUTANEOUS | Status: DC
  Administered 2017-05-03: 40 mg via SUBCUTANEOUS
  Filled 2017-05-02: qty 0.4

## 2017-05-02 MED ORDER — MIDAZOLAM HCL 5 MG/5ML IJ SOLN
INTRAMUSCULAR | Status: DC | PRN
  Administered 2017-05-02: 0.5 mg via INTRAVENOUS
  Administered 2017-05-02: 1 mg via INTRAVENOUS

## 2017-05-02 MED ORDER — ONDANSETRON 4 MG PO TBDP: 4.0000 mg | ORAL_TABLET | Freq: Four times a day (QID) | ORAL | Status: DC | PRN

## 2017-05-02 MED ORDER — SODIUM CHLORIDE 0.9 % IV SOLN
INTRAVENOUS | Status: DC
  Administered 2017-05-02: 19:00:00 via INTRAVENOUS

## 2017-05-02 MED ORDER — DEXAMETHASONE SODIUM PHOSPHATE 10 MG/ML IJ SOLN
INTRAMUSCULAR | Status: AC
  Filled 2017-05-02: qty 1

## 2017-05-02 MED ORDER — SUGAMMADEX SODIUM 200 MG/2ML IV SOLN
INTRAVENOUS | Status: AC
  Filled 2017-05-02: qty 2

## 2017-05-02 MED ORDER — FENTANYL CITRATE (PF) 100 MCG/2ML IJ SOLN
INTRAMUSCULAR | Status: DC | PRN
  Administered 2017-05-02: 100 ug via INTRAVENOUS
  Administered 2017-05-02 (×2): 50 ug via INTRAVENOUS

## 2017-05-02 MED ORDER — DIPHENHYDRAMINE HCL 50 MG/ML IJ SOLN: 25.0000 mg | Freq: Four times a day (QID) | INTRAMUSCULAR | Status: DC | PRN

## 2017-05-02 MED ORDER — HYDROCODONE-ACETAMINOPHEN 5-325 MG PO TABS: 1.0000 | ORAL_TABLET | ORAL | Status: DC | PRN

## 2017-05-02 MED ORDER — LIDOCAINE 2% (20 MG/ML) 5 ML SYRINGE
INTRAMUSCULAR | Status: AC
  Filled 2017-05-02: qty 5

## 2017-05-02 MED ORDER — DIPHENHYDRAMINE HCL 25 MG PO CAPS: 25.0000 mg | ORAL_CAPSULE | Freq: Four times a day (QID) | ORAL | Status: DC | PRN

## 2017-05-02 MED ORDER — FENTANYL CITRATE (PF) 100 MCG/2ML IJ SOLN
100.0000 ug | Freq: Once | INTRAMUSCULAR | Status: AC
  Administered 2017-05-02: 100 ug via INTRAVENOUS

## 2017-05-02 MED ORDER — OXYCODONE HCL 5 MG PO TABS: 5.0000 mg | ORAL_TABLET | Freq: Once | ORAL | Status: DC | PRN

## 2017-05-02 MED ORDER — IOPAMIDOL (ISOVUE-300) INJECTION 61%
100.0000 mL | Freq: Once | INTRAVENOUS | Status: AC | PRN
  Administered 2017-05-02: 100 mL via INTRAVENOUS

## 2017-05-02 MED ORDER — OXYCODONE HCL 5 MG/5ML PO SOLN
5.0000 mg | Freq: Once | ORAL | Status: DC | PRN
  Filled 2017-05-02: qty 5

## 2017-05-02 MED ORDER — MOMETASONE FURO-FORMOTEROL FUM 100-5 MCG/ACT IN AERO
2.0000 | INHALATION_SPRAY | Freq: Two times a day (BID) | RESPIRATORY_TRACT | Status: DC
  Administered 2017-05-03 – 2017-05-04 (×3): 2 via RESPIRATORY_TRACT
  Filled 2017-05-02: qty 8.8

## 2017-05-02 MED ORDER — SUCCINYLCHOLINE CHLORIDE 20 MG/ML IJ SOLN
INTRAMUSCULAR | Status: DC | PRN
  Administered 2017-05-02: 120 mg via INTRAVENOUS

## 2017-05-02 MED ORDER — LACTATED RINGERS IR SOLN
Status: DC | PRN
  Administered 2017-05-02: 1000 mL

## 2017-05-02 MED ORDER — PROPOFOL 10 MG/ML IV BOLUS
INTRAVENOUS | Status: AC
  Filled 2017-05-02: qty 20

## 2017-05-02 MED ORDER — LACTATED RINGERS IV SOLN
INTRAVENOUS | Status: DC | PRN
  Administered 2017-05-02 (×2): via INTRAVENOUS

## 2017-05-02 MED ORDER — DEXTROSE 5 % IV SOLN
2.0000 g | Freq: Once | INTRAVENOUS | Status: AC
  Administered 2017-05-02: 2 g via INTRAVENOUS
  Filled 2017-05-02: qty 2

## 2017-05-02 MED ORDER — ONDANSETRON HCL 4 MG/2ML IJ SOLN: 4.0000 mg | Freq: Four times a day (QID) | INTRAMUSCULAR | Status: DC | PRN

## 2017-05-02 MED ORDER — SUCCINYLCHOLINE CHLORIDE 200 MG/10ML IV SOSY
PREFILLED_SYRINGE | INTRAVENOUS | Status: AC
  Filled 2017-05-02: qty 10

## 2017-05-02 MED ORDER — PROMETHAZINE HCL 25 MG/ML IJ SOLN
INTRAMUSCULAR | Status: AC
  Filled 2017-05-02: qty 1

## 2017-05-02 MED ORDER — ONDANSETRON HCL 4 MG/2ML IJ SOLN
INTRAMUSCULAR | Status: DC | PRN
  Administered 2017-05-02: 4 mg via INTRAVENOUS

## 2017-05-02 MED ORDER — ROCURONIUM BROMIDE 50 MG/5ML IV SOSY
PREFILLED_SYRINGE | INTRAVENOUS | Status: AC
  Filled 2017-05-02: qty 5

## 2017-05-02 MED ORDER — ALBUTEROL SULFATE (2.5 MG/3ML) 0.083% IN NEBU: 2.5000 mg | INHALATION_SOLUTION | Freq: Four times a day (QID) | RESPIRATORY_TRACT | Status: DC | PRN

## 2017-05-02 SURGICAL SUPPLY — 44 items
APL SKNCLS STERI-STRIP NONHPOA (GAUZE/BANDAGES/DRESSINGS) ×1
APPLIER CLIP ROT 10 11.4 M/L (STAPLE)
APR CLP MED LRG 11.4X10 (STAPLE)
BAG SPEC RTRVL 10 TROC 200 (ENDOMECHANICALS) ×1
BANDAGE ADH SHEER 1  50/CT (GAUZE/BANDAGES/DRESSINGS) ×10 IMPLANT
BENZOIN TINCTURE PRP APPL 2/3 (GAUZE/BANDAGES/DRESSINGS) ×2 IMPLANT
CABLE HIGH FREQUENCY MONO STRZ (ELECTRODE) ×2 IMPLANT
CATH CHOLANG 76X19 KUMAR (CATHETERS) IMPLANT
CHLORAPREP W/TINT 26ML (MISCELLANEOUS) ×2 IMPLANT
CLIP APPLIE ROT 10 11.4 M/L (STAPLE) IMPLANT
CLIP VESOLOCK LG 6/CT PURPLE (CLIP) IMPLANT
CLIP VESOLOCK MED LG 6/CT (CLIP) ×2 IMPLANT
CLIP VESOLOCK XL 6/CT (CLIP) ×1 IMPLANT
COVER MAYO STAND STRL (DRAPES) IMPLANT
COVER SURGICAL LIGHT HANDLE (MISCELLANEOUS) ×2 IMPLANT
DECANTER SPIKE VIAL GLASS SM (MISCELLANEOUS) ×1 IMPLANT
DRAIN CHANNEL 19F RND (DRAIN) IMPLANT
DRAPE C-ARM 42X120 X-RAY (DRAPES) IMPLANT
EVACUATOR SILICONE 100CC (DRAIN) IMPLANT
GLOVE BIOGEL PI IND STRL 7.0 (GLOVE) ×1 IMPLANT
GLOVE BIOGEL PI INDICATOR 7.0 (GLOVE) ×6
GLOVE SURG SS PI 7.0 STRL IVOR (GLOVE) ×2 IMPLANT
GOWN STRL REUS W/TWL LRG LVL3 (GOWN DISPOSABLE) ×2 IMPLANT
GOWN STRL REUS W/TWL XL LVL3 (GOWN DISPOSABLE) ×4 IMPLANT
GRASPER SUT TROCAR 14GX15 (MISCELLANEOUS) ×1 IMPLANT
IRRIG SUCT STRYKERFLOW 2 WTIP (MISCELLANEOUS) ×2
IRRIGATION SUCT STRKRFLW 2 WTP (MISCELLANEOUS) ×1 IMPLANT
KIT BASIN OR (CUSTOM PROCEDURE TRAY) ×2 IMPLANT
POUCH RETRIEVAL ECOSAC 10 (ENDOMECHANICALS) ×1 IMPLANT
POUCH RETRIEVAL ECOSAC 10MM (ENDOMECHANICALS) ×1
SCISSORS LAP 5X35 DISP (ENDOMECHANICALS) ×2 IMPLANT
SHEARS HARMONIC ACE PLUS 36CM (ENDOMECHANICALS) IMPLANT
SLEEVE XCEL OPT CAN 5 100 (ENDOMECHANICALS) ×4 IMPLANT
STOPCOCK 4 WAY LG BORE MALE ST (IV SETS) ×1 IMPLANT
STRIP CLOSURE SKIN 1/2X4 (GAUZE/BANDAGES/DRESSINGS) ×2 IMPLANT
SUT ETHILON 2 0 PS N (SUTURE) IMPLANT
SUT MNCRL AB 4-0 PS2 18 (SUTURE) ×2 IMPLANT
SUT VICRYL 0 ENDOLOOP (SUTURE) IMPLANT
TOWEL OR 17X26 10 PK STRL BLUE (TOWEL DISPOSABLE) ×2 IMPLANT
TOWEL OR NON WOVEN STRL DISP B (DISPOSABLE) ×2 IMPLANT
TRAY LAPAROSCOPIC (CUSTOM PROCEDURE TRAY) ×2 IMPLANT
TROCAR BLADELESS OPT 5 100 (ENDOMECHANICALS) ×2 IMPLANT
TROCAR XCEL 12X100 BLDLESS (ENDOMECHANICALS) ×2 IMPLANT
TUBING INSUF HEATED (TUBING) ×2 IMPLANT

## 2017-05-02 NOTE — ED Triage Notes (Signed)
Right upper quadrant abd pain. Pt reports just being discharged from [facility] this am and told she had gall stones. Pt states pain started tonight after eating hamburger. Pt left [facility] without being seen.

## 2017-05-02 NOTE — ED Notes (Signed)
ED Provider at bedside. °

## 2017-05-02 NOTE — Anesthesia Procedure Notes (Signed)
Procedure Name: Intubation Date/Time: 05/02/2017 3:14 PM Performed by: Lissa Morales, CRNA Pre-anesthesia Checklist: Patient identified, Emergency Drugs available, Suction available and Patient being monitored Patient Re-evaluated:Patient Re-evaluated prior to induction Oxygen Delivery Method: Circle system utilized Preoxygenation: Pre-oxygenation with 100% oxygen Induction Type: IV induction Ventilation: Mask ventilation without difficulty Laryngoscope Size: Mac and 3 Grade View: Grade II Tube type: Oral Tube size: 7.0 mm Number of attempts: 1 Airway Equipment and Method: Stylet and Oral airway Placement Confirmation: ETT inserted through vocal cords under direct vision,  positive ETCO2 and breath sounds checked- equal and bilateral Secured at: 21 cm Tube secured with: Tape Dental Injury: Teeth and Oropharynx as per pre-operative assessment  Difficulty Due To: Difficult Airway- due to limited oral opening Comments: Very small mouth

## 2017-05-02 NOTE — Transfer of Care (Signed)
Immediate Anesthesia Transfer of Care Note  Patient: Traci Johnson  Procedure(s) Performed: LAPAROSCOPIC CHOLECYSTECTOMY (N/A )  Patient Location: PACU  Anesthesia Type:General  Level of Consciousness: awake, alert , patient cooperative and responds to stimulation  Airway & Oxygen Therapy: Patient Spontanous Breathing and Patient connected to face mask oxygen  Post-op Assessment: Report given to RN, Post -op Vital signs reviewed and stable and Patient moving all extremities X 4  Post vital signs: stable  Last Vitals:  Vitals:   05/02/17 1400 05/02/17 1630  BP: (!) 146/75 140/71  Pulse: 84 80  Resp:  15  Temp:  36.8 C  SpO2: 92% 98%    Last Pain:  Vitals:   05/02/17 1630  TempSrc:   PainSc: Asleep         Complications: No apparent anesthesia complications

## 2017-05-02 NOTE — ED Notes (Signed)
Pt has saline lock in place from [facility].

## 2017-05-02 NOTE — ED Notes (Signed)
Patient in ultrasound

## 2017-05-02 NOTE — ED Provider Notes (Signed)
[facility] Provider Note: Traci Spurling, MD, FACEP  CSN: [id number] MRN: [id number] ARRIVAL: 05/02/17 at [place]: [place]  Abdominal Pain   HISTORY OF PRESENT ILLNESS  05/02/17 3:09 AM Traci Johnson is a 66 y.o. female recently admitted to the hospital for chest pain.  Cardiac workup was unremarkable.  She was subsequently diagnosed with cholelithiasis.  She was discharged from the hospital yesterday.  She is here with abdominal pain that began yesterday evening about 6 PM.  This occurred after eating.  The pain was located in the right upper quadrant but has moved downward somewhat since.  She describes the pain as sharp and severe.  It is been associated with nausea and profuse vomiting.  Pain is worse with movement or palpation.  The pain does radiate to the left side of the abdomen.   Past Medical History:  Diagnosis Date   Allergy    Asthma    Hypertension    Migraines    cluster    Past Surgical History:  Procedure Laterality Date   ABDOMINAL HYSTERECTOMY  1985   LEFT HEART CATH AND CORONARY ANGIOGRAPHY N/A 04/30/2017   Procedure: LEFT HEART CATH AND CORONARY ANGIOGRAPHY;  Surgeon: Lorretta Harp, MD;  Location: [facility];  Service: Cardiovascular;  Laterality: N/A;   LIGAMENT REPAIR Right    birth defect    Family History  Problem Relation Age of Onset   Dementia Mother    Cancer Father    CAD Neg Hx     Social History   Tobacco Use   Smoking status: Never Smoker   Smokeless tobacco: Never Used  Substance Use Topics   Alcohol use: Yes    Alcohol/week: 0.0 oz    Comment: rare   Drug use: No    Prior to Admission medications   Medication Sig Start Date End Date Taking? Authorizing Provider  albuterol (PROVENTIL HFA;VENTOLIN HFA) 108 (90 BASE) MCG/ACT inhaler Inhale 2 puffs into the lungs every 6 (six) hours as needed for wheezing.   Yes [provider]  aspirin 81 MG tablet Take 81  mg by mouth daily.   Yes [provider]  Cholecalciferol (VITAMIN D3) 5000 units CAPS Take 1 capsule by mouth daily.   Yes [provider]  cyclobenzaprine (FLEXERIL) 5 MG tablet TAKE ONE TABLET BY MOUTH THREE TIMES DAILY AS NEEDED FOR MUSCLE SPASM 09/24/14  Yes Hawks, Christy A, FNP  estradiol (ESTRACE) 1 MG tablet Take 1 mg by mouth daily.    Yes [provider]  Fluticasone-Salmeterol (ADVAIR DISKUS) 100-50 MCG/DOSE AEPB INHALE ONE DOSE BY MOUTH EVERY 12 HOURS 07/15/14  Yes Evelina Dun A, FNP  meloxicam (MOBIC) 15 MG tablet TAKE ONE TABLET BY MOUTH ONCE DAILY 09/24/14  Yes Hawks, Christy A, FNP  Potassium 99 MG TABS Take 1 tablet by mouth daily.   Yes [provider]  SUMAtriptan (IMITREX) 6 MG/0.5ML SOLN injection Inject 6 mg into the skin every 2 (two) hours as needed for migraine or headache.    Yes [provider]  vitamin B-12 (CYANOCOBALAMIN) 1000 MCG tablet Take 1,000 mcg by mouth daily.   Yes [provider]  vitamin E (VITAMIN E) 400 UNIT capsule Take 400 Units by mouth daily.   Yes [provider]    Allergies Codeine and Lisinopril   REVIEW OF SYSTEMS  Negative except as noted here or in the History of Present Illness.   PHYSICAL EXAMINATION  Initial  Vital Signs Blood pressure (!) 166/92, pulse (!) 104, temperature 97.6 F (36.4 C), temperature source Oral, resp. rate (!) 22, height 5\' 3"  (1.6 m), weight 83 kg (183 lb), SpO2 97 %.  Examination General: Well-developed, well-nourished female in no acute distress; appearance consistent with age of record HENT: normocephalic; atraumatic Eyes: pupils equal, round and reactive to light; extraocular muscles intact Neck: supple Heart: regular rate and rhythm Lungs: clear to auscultation bilaterally Abdomen: soft; nondistended; right mid abdominal tenderness; no masses or hepatosplenomegaly; bowel sounds present Extremities: No deformity; full range of motion;  pulses normal Neurologic: Awake, alert and oriented; motor function intact in all extremities and symmetric; no facial droop Skin: Warm and dry Psychiatric: Anxious   RESULTS  Summary of this visit's results, reviewed by myself:   EKG Interpretation  Date/Time:    Ventricular Rate:    PR Interval:    QRS Duration:   QT Interval:    QTC Calculation:   R Axis:     Text Interpretation:        Laboratory Studies: Results for orders placed or performed during the hospital encounter of 05/02/17 (from the past 24 hour(s))  Lipase, blood     Status: None   Collection Time: 05/02/17  2:15 AM  Result Value Ref Range   Lipase 30 11 - 51 U/L  CBC with Differential     Status: Abnormal   Collection Time: 05/02/17  2:15 AM  Result Value Ref Range   WBC 17.5 (H) 4.0 - 10.5 K/uL   RBC 4.34 3.87 - 5.11 MIL/uL   Hemoglobin 12.5 12.0 - 15.0 g/dL   HCT 37.5 36.0 - 46.0 %   MCV 86.4 78.0 - 100.0 fL   MCH 28.8 26.0 - 34.0 pg   MCHC 33.3 30.0 - 36.0 g/dL   RDW 12.9 11.5 - 15.5 %   Platelets 349 150 - 400 K/uL   Neutrophils Relative % 85 %   Lymphocytes Relative 10 %   Monocytes Relative 5 %   Eosinophils Relative 0 %   Basophils Relative 0 %   Neutro Abs 14.8 (H) 1.7 - 7.7 K/uL   Lymphs Abs 1.8 0.7 - 4.0 K/uL   Monocytes Absolute 0.9 0.1 - 1.0 K/uL   Eosinophils Absolute 0.0 0.0 - 0.7 K/uL   Basophils Absolute 0.0 0.0 - 0.1 K/uL   Smear Review MORPHOLOGY UNREMARKABLE   Comprehensive metabolic panel     Status: Abnormal   Collection Time: 05/02/17  2:15 AM  Result Value Ref Range   Sodium 137 135 - 145 mmol/L   Potassium 3.8 3.5 - 5.1 mmol/L   Chloride 106 101 - 111 mmol/L   CO2 23 22 - 32 mmol/L   Glucose, Bld 144 (H) 65 - 99 mg/dL   BUN 25 (H) 6 - 20 mg/dL   Creatinine, Ser 0.51 0.44 - 1.00 mg/dL   Calcium 10.0 8.9 - 10.3 mg/dL   Total Protein 7.4 6.5 - 8.1 g/dL   Albumin 3.7 3.5 - 5.0 g/dL   AST 40 15 - 41 U/L   ALT 32 14 - 54 U/L   Alkaline Phosphatase 93 38 - 126 U/L    Total Bilirubin 0.3 0.3 - 1.2 mg/dL   GFR calc non Af Amer >60 >60 mL/min   GFR calc Af Amer >60 >60 mL/min   Anion gap 8 5 - 15  Urinalysis, Routine w reflex microscopic     Status: Abnormal   Collection Time: 05/02/17  6:39 AM  Result Value Ref Range   Color, Urine STRAW (A) YELLOW   APPearance CLOUDY (A) CLEAR   Specific Gravity, Urine 1.010 1.005 - 1.030   pH 7.5 5.0 - 8.0   Glucose, UA NEGATIVE NEGATIVE mg/dL   Hgb urine dipstick TRACE (A) NEGATIVE   Bilirubin Urine NEGATIVE NEGATIVE   Ketones, ur NEGATIVE NEGATIVE mg/dL   Protein, ur NEGATIVE NEGATIVE mg/dL   Nitrite NEGATIVE NEGATIVE   Leukocytes, UA NEGATIVE NEGATIVE  Urinalysis, Microscopic (reflex)     Status: Abnormal   Collection Time: 05/02/17  6:39 AM  Result Value Ref Range   RBC / HPF 0-5 0 - 5 RBC/hpf   WBC, UA 0-5 0 - 5 WBC/hpf   Bacteria, UA NONE SEEN NONE SEEN   Squamous Epithelial / LPF 0-5 (A) NONE SEEN   Amorphous Crystal PRESENT   I-Stat CG4 Lactic Acid, ED     Status: Abnormal   Collection Time: 05/02/17  7:04 AM  Result Value Ref Range   Lactic Acid, Venous 2.44 (HH) 0.5 - 1.9 mmol/L   Comment NOTIFIED PHYSICIAN   I-Stat CG4 Lactic Acid, ED     Status: None   Collection Time: 05/02/17  9:12 AM  Result Value Ref Range   Lactic Acid, Venous 1.73 0.5 - 1.9 mmol/L   Imaging Studies: Ct Abdomen Pelvis W Contrast  Result Date: 05/02/2017 CLINICAL DATA:  Episodic RIGHT upper quadrant pain for 6 days. History of cholelithiasis, hysterectomy. EXAM: CT ABDOMEN AND PELVIS WITH CONTRAST TECHNIQUE: Multidetector CT imaging of the abdomen and pelvis was performed using the standard protocol following bolus administration of intravenous contrast. CONTRAST:  138mL ISOVUE-300 IOPAMIDOL (ISOVUE-300) INJECTION 61% COMPARISON:  Abdominal ultrasound April 29, 2017 FINDINGS: LOWER CHEST: 3 mm LEFT lower lobe sub solid pleural based nodule, likely benign and no routine indicated follow-up in subcentimeter lymph node  RIGHT costophrenic angle. Included heart size is normal. No pericardial effusion. HEPATOBILIARY: Slightly nodular RIGHT hepatic contour with focal fatty infiltration about the falciform ligament. Distended gallbladder. Known cholelithiasis are isodense by CT. No gallbladder wall thickening or pericholecystic fluid. PANCREAS: Normal. SPLEEN: Normal. ADRENALS/URINARY TRACT: Kidneys are orthotopic, demonstrating symmetric enhancement. No nephrolithiasis, hydronephrosis or solid renal masses. The unopacified ureters are normal in course and caliber. Delayed imaging through the kidneys demonstrates symmetric prompt contrast excretion within the proximal urinary collecting system. Urinary bladder is decompressed and unremarkable. Normal adrenal glands. STOMACH/BOWEL: Small hiatal hernia. The stomach, small and large bowel are normal in course and caliber without inflammatory changes. Enteric contrast has not yet reached the distal small bowel. Small amount of small bowel feces compatible with chronic stasis. Moderate descending and sigmoid colonic diverticulosis. Mild amount of retained large bowel stool. Normal appendix. VASCULAR/LYMPHATIC: Aortoiliac vessels are normal in course and caliber. Mild calcific atherosclerosis. No lymphadenopathy by CT size criteria. REPRODUCTIVE: Status post hysterectomy. OTHER: No intraperitoneal free fluid or free air. MUSCULOSKELETAL: Nonacute.  Mild degenerative change of the hips. IMPRESSION: 1. No acute intra-abdominal or pelvic process. 2. Early potential cirrhosis. Recommend correlation with liver function tests on nonemergent basis. Electronically Signed   By: Elon Alas M.D.   On: 05/02/2017 04:28   US Abdomen Limited Ruq  Result Date: 05/02/2017 CLINICAL DATA:  Acute right upper quadrant abdominal pain. EXAM: ULTRASOUND ABDOMEN LIMITED RIGHT UPPER QUADRANT COMPARISON:  CT scan of same day.  Ultrasound of April 29, 2017. FINDINGS: Gallbladder: Multiple gallstones and  sludge are noted within gallbladder lumen. No gallbladder wall thickening or pericholecystic fluid is noted. Positive  sonographic Murphy's sign is noted. Common bile duct: Diameter: 4.8 mm which is within normal limits. Liver: No focal lesion identified. Within normal limits in parenchymal echogenicity. Portal vein is patent on color Doppler imaging with normal direction of blood flow towards the liver. IMPRESSION: Cholelithiasis and sludge is noted within gallbladder lumen without gallbladder wall thickening or pericholecystic fluid. Positive sonographic Percell Miller sign is noted, and if there is clinical concern for cholecystitis, HIDA scan would be recommended for further evaluation. Electronically Signed   By: Marijo Conception, M.D.   On: 05/02/2017 09:05    ED COURSE  Nursing notes and initial vitals signs, including pulse oximetry, reviewed.  Vitals:   05/02/17 0904 05/02/17 1048 05/02/17 1055 05/02/17 1400  BP: (!) 157/88 121/79 (!) 151/92 (!) 146/75  Pulse: 90 94 90 84  Resp: 18 14 18    Temp: (!) 97.4 F (36.3 C)  98.4 F (36.9 C)   TempSrc: Oral  Oral   SpO2: 95% 96% 99% 92%  Weight:   83 kg (183 lb)   Height:   5\' 3"  (1.6 m)    6:29 AM Patient still complaining of pain despite several doses of fentanyl and unremarkable CT and labs.  Since she has known recently diagnosed gallstones a biliary ultrasound is pending.  We will try a dose of Bentyl in the meantime.  PROCEDURES    ED DIAGNOSES     ICD-10-CM   1. RUQ pain R10.11 US Abdomen Limited RUQ    US Abdomen Limited RUQ  2. Calculus of gallbladder with acute cholecystitis without obstruction K80.00        Ajay Strubel, MD 05/02/17 1449

## 2017-05-02 NOTE — ED Provider Notes (Signed)
Pt signed out by Dr. Florina Ou awaiting results of [place].  The pt has remained in quite a bit of pain and has required more pain med dosages.  [place] results show multiple gallstones with sludge and a + Murphy's sign.  The pt was d/w Will Creig Hines (general surgery PA) who requested pt be transferred to the [facility] ED so they can evaluate her.  I spoke with Dr. Zenia Resides (ED attending) who accepted transfer.  Pt remains stable for transfer.   Isla Pence, MD 05/02/17 [phone]

## 2017-05-02 NOTE — ED Notes (Signed)
Pt seen leaving the department

## 2017-05-02 NOTE — ED Notes (Addendum)
Received patient from [facility] via [place], [id number]. Patient received Fentanyl 50 mcg and Dilaudid 1 mg IV prior to arrival to tthe ED.

## 2017-05-02 NOTE — Op Note (Signed)
PATIENT:  Traci Johnson  66 y.o. female  PRE-OPERATIVE DIAGNOSIS:  cholelithiasis  POST-OPERATIVE DIAGNOSIS:  cholelithiasis  PROCEDURE:  Procedure(s): LAPAROSCOPIC CHOLECYSTECTOMY   SURGEON:  Surgeon(s): Chanequa Spees, Arta Bruce, MD  ASSISTANT: none  ANESTHESIA:   local and general  Indications for procedure: Traci Johnson is a 66 y.o. female with symptoms of Abdominal pain and Nausea and vomiting consistent with gallbladder disease, Confirmed by Ultrasound.  Description of procedure: The patient was brought into the operative suite, placed supine. Anesthesia was administered with endotracheal tube. Patient was strapped in place and foot board was secured. All pressure points were offloaded by foam padding. The patient was prepped and draped in the usual sterile fashion. The lower right quadrant had one area of concern for ringworm, a tegaderm was put over top to keep it from the sterile field.  A small incision was made to the right of the umbilicus. A 43mm trocar was inserted into the peritoneal cavity with optical entry. Pneumoperitoneum was applied with high flow low pressure. 2 56mm trocars were placed in the RUQ. A 70mm trocar was placed in the subxiphoid space. All trocars sites were first anesthesized with marcaine with epinephrine in the subcutaneous and preperitoneal layers. Next the patient was placed in reverse trendelenberg. The gallbladder fundus appeared hemorrhagic and black in color  The gallbladder was retracted cephalad and lateral. The medial aspect of the body was incised with cautery to reflect the peritoneum and a puncture into the gallbladder was made. Bile and small stones were removed with suction. The peritoneum was reflected off the infundibulum working lateral to medial. The cystic duct and cystic artery were identified and further dissection revealed a critical view. The cystic duct and cystic artery were doubly clipped and ligated.   The gallbladder was removed  off the liver bed with cautery. A larger than average amount of bleeding was encountered in the gallbladder bed but cautery was able to bring about hemostasis. The Gallbladder was placed in a specimen bag. The gallbladder fossa was irrigated and hemostasis was applied with cautery. The gallbladder was removed via the 80mm trocar. The fascial defect was closed with interrupted 0 vicryl suture via laparoscopic trans-fascial suture passer. Pneumoperitoneum was removed, all trocar were removed. All incisions were closed with 4-0 monocryl subcuticular stitch. The patient woke from anesthesia and was brought to PACU in stable condition. All counts were correct  Findings: hemorrhagic/necrotic gallbladder  Specimen: gallbladder  Blood loss: Total I/O In: 1050 [IV [id number]] Out: 50 [Blood:50] ml  Local anesthesia: 27 ml 8.9% marcaine  Complications: none  PLAN OF CARE: Admit for overnight observation  PATIENT DISPOSITION:  PACU - hemodynamically stable.  Gurney Maxin, M.D. General, Bariatric, & Minimally Invasive Surgery [facility]

## 2017-05-02 NOTE — Anesthesia Preprocedure Evaluation (Signed)
Anesthesia Evaluation  Patient identified by MRN, date of birth, ID band Patient awake  General Assessment Comment:Toxic appearing woman in mild distress  Reviewed: Allergy & Precautions, NPO status , Patient's Chart, lab work & pertinent test results  Airway Mallampati: III  TM Distance: <3 FB Neck ROM: Full  Mouth opening: Limited Mouth Opening  Dental no notable dental hx. (+) Teeth Intact   Pulmonary asthma ,  Use inhalers daily   breath sounds clear to auscultation       Cardiovascular hypertension,  Rhythm:Regular Rate:Tachycardia  Recent cardiac workup was neg   Neuro/Psych  Headaches,    GI/Hepatic cholelithiasis   Endo/Other  Morbid obesity  Renal/GU negative Renal ROS     Musculoskeletal negative musculoskeletal ROS (+)   Abdominal (+) + obese,   Peds  Hematology   Anesthesia Other Findings   Reproductive/Obstetrics                             Anesthesia Physical Anesthesia Plan  ASA: III and emergent  Anesthesia Plan: General   Post-op Pain Management:    Induction:   PONV Risk Score and Plan: 4 or greater and Treatment may vary due to age or medical condition, Dexamethasone, Ondansetron and Midazolam  Airway Management Planned: Oral ETT  Additional Equipment:   Intra-op Plan:   Post-operative Plan: Extubation in OR  Informed Consent: I have reviewed the patients History and Physical, chart, labs and discussed the procedure including the risks, benefits and alternatives for the proposed anesthesia with the patient or authorized representative who has indicated his/her understanding and acceptance.   Dental advisory given  Plan Discussed with: CRNA  Anesthesia Plan Comments:         Anesthesia Quick Evaluation

## 2017-05-02 NOTE — Anesthesia Postprocedure Evaluation (Signed)
Anesthesia Post Note  Patient: Traci Johnson  Procedure(s) Performed: LAPAROSCOPIC CHOLECYSTECTOMY (N/A )     Patient location during evaluation: PACU Anesthesia Type: General Level of consciousness: sedated Pain management: pain level controlled Vital Signs Assessment: post-procedure vital signs reviewed and stable Respiratory status: spontaneous breathing and respiratory function stable Cardiovascular status: stable Postop Assessment: no apparent nausea or vomiting Anesthetic complications: no    Last Vitals:  Vitals:   05/02/17 1715 05/02/17 1732  BP: (!) 160/95 (!) 164/92  Pulse: 79 80  Resp: 12 14  Temp: 36.6 C 36.8 C  SpO2: 96% 94%    Last Pain:  Vitals:   05/02/17 1715  TempSrc:   PainSc: Asleep                 [name]

## 2017-05-02 NOTE — Consult Note (Deleted)
Reason for Consult: Abdominal pain with cholelithiasis. Referring Physician: Dr. Aileen Pilot  Traci Johnson is an 66 y.o. female.  HPI: Patient is a 66 year old female who presented to the ED on 04/27/2018 with complaints of chest pain with acute onset.  She was seen and admitted by medicine.  Troponins are negative on admission.  EKG was unremarkable.  She was seen in consultation by cardiology and a stress test was obtained.  Stress test was abnormal patient was subsequently scheduled for cardiac catheterization 04/30/17.  Cardiac catheterization showed normal coronaries with an EF of 55-60% with an ejection fraction of 55-65%.  Postop she had some hypotension was actually placed on dopamine for a vasovagal response.  Ultrasound during this admission showed multiple gallstones including a nonmobile 1.6 cm stone in the gallbladder neck with no wall thickening no sonographic Murphy's sign common bile duct was 5 mm.  She did not have evidence of acute cholecystitis and was subsequently discharged home yesterday.    She wanted a burger so they stopped at a cookout and got a hamburger on the way home.  Her husband took her home and then later went out to the grocery store.  When he came back she was bent over having the same pain she had before this time it was more in the right upper quadrant.  On admission 04/27/2017 patient noted patient was more midline substernal pain.   She presented to the emergency department at [facility] around 3 AM this morning.  They had spent 4 hours at [facility] ED and had not been seen.  At this time she was complaining of severe abdominal pain.  Workup shows she is afebrile mildly hypertensive.  Labs shows a normal CMP, lipase of 30 LFTs are normal.  WBC is 17.5, hemoglobin 12.5 hematocrit 37, urinalysis is normal.  CT of the abdomen shows no acute intra-abdominal or pelvic process.  Early potential cirrhosis.  A repeat ultrasound shows multiple  gallstones and sludge within the gallbladder lumen.  No gallbladder wall Pericholecystic fluid is noted.  There is a positive Murphy sign.  Common bile duct remains normal at 4.8 mm.  There are no focal lesions identified within the liver.  Patient continues to have abdominal pain requiring multiple doses Of IV pain medication in the ED and we are asked to see her in consult.  Patient subsequently be transferred to [facility] emergency department for evaluation and further treatment.   Past Medical History:  Diagnosis Date   Allergy    Asthma    Hypertension    Migraines    cluster    Past Surgical History:  Procedure Laterality Date   ABDOMINAL HYSTERECTOMY  1985   LEFT HEART CATH AND CORONARY ANGIOGRAPHY N/A 04/30/2017   Procedure: LEFT HEART CATH AND CORONARY ANGIOGRAPHY;  Surgeon: Lorretta Harp, MD;  Location: [place] [facility];  Service: Cardiovascular;  Laterality: N/A;   LIGAMENT REPAIR Right    birth defect    Family History  Problem Relation Age of Onset   Dementia Mother    Cancer Father    CAD Neg Hx     Social History:  reports that  has never smoked. she has never used smokeless tobacco. She reports that she drinks alcohol. She reports that she does not use drugs.  Allergies:  Allergies  Allergen Reactions   Codeine Nausea And Vomiting   Lisinopril     cough    Prior to Admission medications  Medication Sig Start Date End Date Taking? Authorizing Provider  albuterol (PROVENTIL HFA;VENTOLIN HFA) 108 (90 BASE) MCG/ACT inhaler Inhale 2 puffs into the lungs every 6 (six) hours as needed for wheezing.    [provider]  aspirin 81 MG tablet Take 81 mg by mouth daily.    [provider]  cholecalciferol (VITAMIN D) 1000 units tablet Take 5,000 Units by mouth daily.    [provider]  cyclobenzaprine (FLEXERIL) 5 MG tablet TAKE ONE TABLET BY MOUTH THREE TIMES DAILY AS NEEDED FOR MUSCLE SPASM 09/24/14   Evelina Dun A, FNP  estradiol (ESTRACE) 1 MG tablet Take 0.5 mg by mouth daily.     [provider]  Fluticasone-Salmeterol (ADVAIR DISKUS) 100-50 MCG/DOSE AEPB INHALE ONE DOSE BY MOUTH EVERY 12 HOURS 07/15/14   Evelina Dun A, FNP  meloxicam (MOBIC) 15 MG tablet TAKE ONE TABLET BY MOUTH ONCE DAILY 09/24/14   Evelina Dun A, FNP  SUMAtriptan (IMITREX) 6 MG/0.5ML SOLN injection Inject 6 mg into the skin every 2 (two) hours as needed for migraine or headache. F    [provider]  vitamin B-12 (CYANOCOBALAMIN) 1000 MCG tablet Take 1,000 mcg by mouth daily.    [provider]  vitamin E (VITAMIN E) 400 UNIT capsule Take 400 Units by mouth daily.    [provider]     Results for orders placed or performed during the hospital encounter of 05/02/17 (from the past 48 hour(s))  Lipase, blood     Status: None   Collection Time: 05/02/17  2:15 AM  Result Value Ref Range   Lipase 30 11 - 51 U/L  CBC with Differential     Status: Abnormal   Collection Time: 05/02/17  2:15 AM  Result Value Ref Range   WBC 17.5 (H) 4.0 - 10.5 K/uL   RBC 4.34 3.87 - 5.11 MIL/uL   Hemoglobin 12.5 12.0 - 15.0 g/dL   HCT 37.5 36.0 - 46.0 %   MCV 86.4 78.0 - 100.0 fL   MCH 28.8 26.0 - 34.0 pg   MCHC 33.3 30.0 - 36.0 g/dL   RDW 12.9 11.5 - 15.5 %   Platelets 349 150 - 400 K/uL   Neutrophils Relative % 85 %   Lymphocytes Relative 10 %   Monocytes Relative 5 %   Eosinophils Relative 0 %   Basophils Relative 0 %   Neutro Abs 14.8 (H) 1.7 - 7.7 K/uL   Lymphs Abs 1.8 0.7 - 4.0 K/uL   Monocytes Absolute 0.9 0.1 - 1.0 K/uL   Eosinophils Absolute 0.0 0.0 - 0.7 K/uL   Basophils Absolute 0.0 0.0 - 0.1 K/uL   Smear Review MORPHOLOGY UNREMARKABLE   Comprehensive metabolic panel     Status: Abnormal   Collection Time: 05/02/17  2:15 AM  Result Value Ref Range   Sodium 137 135 - 145 mmol/L   Potassium 3.8 3.5 - 5.1 mmol/L   Chloride 106 101 - 111 mmol/L   CO2 23 22 - 32 mmol/L    Glucose, Bld 144 (H) 65 - 99 mg/dL   BUN 25 (H) 6 - 20 mg/dL   Creatinine, Ser 0.51 0.44 - 1.00 mg/dL   Calcium 10.0 8.9 - 10.3 mg/dL   Total Protein 7.4 6.5 - 8.1 g/dL   Albumin 3.7 3.5 - 5.0 g/dL   AST 40 15 - 41 U/L   ALT 32 14 - 54 U/L   Alkaline Phosphatase 93 38 - 126 U/L   Total  Bilirubin 0.3 0.3 - 1.2 mg/dL   GFR calc non Af Amer >60 >60 mL/min   GFR calc Af Amer >60 >60 mL/min    Comment: (NOTE) The eGFR has been calculated using the CKD EPI equation. This calculation has not been validated in all clinical situations. eGFR's persistently <60 mL/min signify possible Chronic Kidney Disease.    Anion gap 8 5 - 15  Urinalysis, Routine w reflex microscopic     Status: Abnormal   Collection Time: 05/02/17  6:39 AM  Result Value Ref Range   Color, Urine STRAW (A) YELLOW   APPearance CLOUDY (A) CLEAR   Specific Gravity, Urine 1.010 1.005 - 1.030   pH 7.5 5.0 - 8.0   Glucose, UA NEGATIVE NEGATIVE mg/dL   Hgb urine dipstick TRACE (A) NEGATIVE   Bilirubin Urine NEGATIVE NEGATIVE   Ketones, ur NEGATIVE NEGATIVE mg/dL   Protein, ur NEGATIVE NEGATIVE mg/dL   Nitrite NEGATIVE NEGATIVE   Leukocytes, UA NEGATIVE NEGATIVE  Urinalysis, Microscopic (reflex)     Status: Abnormal   Collection Time: 05/02/17  6:39 AM  Result Value Ref Range   RBC / HPF 0-5 0 - 5 RBC/hpf   WBC, UA 0-5 0 - 5 WBC/hpf   Bacteria, UA NONE SEEN NONE SEEN   Squamous Epithelial / LPF 0-5 (A) NONE SEEN   Amorphous Crystal PRESENT   I-Stat CG4 Lactic Acid, ED     Status: Abnormal   Collection Time: 05/02/17  7:04 AM  Result Value Ref Range   Lactic Acid, Venous 2.44 (HH) 0.5 - 1.9 mmol/L   Comment NOTIFIED PHYSICIAN   I-Stat CG4 Lactic Acid, ED     Status: None   Collection Time: 05/02/17  9:12 AM  Result Value Ref Range   Lactic Acid, Venous 1.73 0.5 - 1.9 mmol/L    Ct Abdomen Pelvis W Contrast  Result Date: 05/02/2017 CLINICAL DATA:  Episodic RIGHT upper quadrant pain for 6 days. History of  cholelithiasis, hysterectomy. EXAM: CT ABDOMEN AND PELVIS WITH CONTRAST TECHNIQUE: Multidetector CT imaging of the abdomen and pelvis was performed using the standard protocol following bolus administration of intravenous contrast. CONTRAST:  175m ISOVUE-300 IOPAMIDOL (ISOVUE-300) INJECTION 61% COMPARISON:  Abdominal ultrasound April 29, 2017 FINDINGS: LOWER CHEST: 3 mm LEFT lower lobe sub solid pleural based nodule, likely benign and no routine indicated follow-up in subcentimeter lymph node RIGHT costophrenic angle. Included heart size is normal. No pericardial effusion. HEPATOBILIARY: Slightly nodular RIGHT hepatic contour with focal fatty infiltration about the falciform ligament. Distended gallbladder. Known cholelithiasis are isodense by CT. No gallbladder wall thickening or pericholecystic fluid. PANCREAS: Normal. SPLEEN: Normal. ADRENALS/URINARY TRACT: Kidneys are orthotopic, demonstrating symmetric enhancement. No nephrolithiasis, hydronephrosis or solid renal masses. The unopacified ureters are normal in course and caliber. Delayed imaging through the kidneys demonstrates symmetric prompt contrast excretion within the proximal urinary collecting system. Urinary bladder is decompressed and unremarkable. Normal adrenal glands. STOMACH/BOWEL: Small hiatal hernia. The stomach, small and large bowel are normal in course and caliber without inflammatory changes. Enteric contrast has not yet reached the distal small bowel. Small amount of small bowel feces compatible with chronic stasis. Moderate descending and sigmoid colonic diverticulosis. Mild amount of retained large bowel stool. Normal appendix. VASCULAR/LYMPHATIC: Aortoiliac vessels are normal in course and caliber. Mild calcific atherosclerosis. No lymphadenopathy by CT size criteria. REPRODUCTIVE: Status post hysterectomy. OTHER: No intraperitoneal free fluid or free air. MUSCULOSKELETAL: Nonacute.  Mild degenerative change of the hips. IMPRESSION:  1. No acute intra-abdominal or pelvic  process. 2. Early potential cirrhosis. Recommend correlation with liver function tests on nonemergent basis. Electronically Signed   By: Elon Alas M.D.   On: 05/02/2017 04:28   US Abdomen Limited Ruq  Result Date: 05/02/2017 CLINICAL DATA:  Acute right upper quadrant abdominal pain. EXAM: ULTRASOUND ABDOMEN LIMITED RIGHT UPPER QUADRANT COMPARISON:  CT scan of same day.  Ultrasound of April 29, 2017. FINDINGS: Gallbladder: Multiple gallstones and sludge are noted within gallbladder lumen. No gallbladder wall thickening or pericholecystic fluid is noted. Positive sonographic Murphy's sign is noted. Common bile duct: Diameter: 4.8 mm which is within normal limits. Liver: No focal lesion identified. Within normal limits in parenchymal echogenicity. Portal vein is patent on color Doppler imaging with normal direction of blood flow towards the liver. IMPRESSION: Cholelithiasis and sludge is noted within gallbladder lumen without gallbladder wall thickening or pericholecystic fluid. Positive sonographic Percell Miller sign is noted, and if there is clinical concern for cholecystitis, HIDA scan would be recommended for further evaluation. Electronically Signed   By: Marijo Conception, M.D.   On: 05/02/2017 09:05    Review of Systems  Constitutional: Negative for chills, diaphoresis, fever, malaise/fatigue and weight loss.  HENT: Negative.   Eyes: Negative.   Respiratory: Negative.   Cardiovascular: Negative.   Gastrointestinal: Positive for abdominal pain, diarrhea, nausea and vomiting. Negative for blood in stool, constipation and melena.  Genitourinary: Negative.   Musculoskeletal: Positive for back pain. Negative for falls, joint pain, myalgias and neck pain (some back pain with the chest/RUQ pain).  Skin: Negative.   Neurological: Negative.  Negative for weakness.  Endo/Heme/Allergies: Negative.   Psychiatric/Behavioral: The patient is nervous/anxious.     Blood pressure (!) 157/88, pulse 90, temperature (!) 97.4 F (36.3 C), temperature source Oral, resp. rate 18, height _0  (1.6 m), weight 83 kg (183 lb), SpO2 95 %. Physical Exam  Constitutional: She is oriented to person, place, and time. She appears well-developed and well-nourished. No distress.  Actually looks pretty uncomfortable, with ongoing right upper quadrant pain.  HENT:  Head: Normocephalic and atraumatic.  Mouth/Throat: No oropharyngeal exudate.  Eyes: Right eye exhibits no discharge. Left eye exhibits no discharge. No scleral icterus.  Pupils are equal  Neck: Normal range of motion. No JVD present. No tracheal deviation present. No thyromegaly present.  Cardiovascular: Normal rate, regular rhythm, normal heart sounds and intact distal pulses. Exam reveals no gallop and no friction rub.  No murmur heard. Respiratory: Effort normal and breath sounds normal. No respiratory distress. She has no wheezes. She has no rales. She exhibits no tenderness.  GI: She exhibits no distension and no mass. There is tenderness (Right upper quadrant). There is no rebound and no guarding.  Musculoskeletal: She exhibits no edema or tenderness.  Lymphadenopathy:    She has no cervical adenopathy.  Neurological: She is alert and oriented to person, place, and time. No cranial nerve deficit.  Skin: Skin is warm and dry. No rash noted. She is not diaphoretic. No erythema. There is pallor.  Psychiatric: She has a normal mood and affect. Her behavior is normal. Judgment and thought content normal.    Assessment/Plan: Symptomatic cholelithiasis/cholecystitis. Chest pain with normal cardiac catheterization 04/30/12 Hypertension History of migraines IBS with loose stools.  Plan: We will admit the patient, hydrate her, IV fluids IV antibiotics.  Laparoscopic cholecystectomy later today.  Chasitty Hehl 05/02/2017, 9:36 AM

## 2017-05-02 NOTE — Significant Event (Addendum)
Rapid Response Event Note  Overview: Time Called: 2100 Arrival Time: 2110 Event Type: Neurologic(Decreased LOC)  Initial Focused Assessment:  Notified by phone by primary nurse Vira Agar, that pt had a brief episode of being unresponsive, but that currently pt awake but drowsy but able to follow commands, and that she had notified Dr. Marcello Moores and that Dr. Marcello Moores wanted the rapid response nurse to come to evaluate patient.  On my arrival pt resting in bed, eyes closed, no obvious signs of distress.  Pt opened eyes to verbal command, is alert and oriented x4, able to follow simple commands, and responding to questions appropriately, but drowsy.  Denies any chest pain.  Skin currently warm and dry.  Lungs clear bilaterally.  Strong pulses, movement and sensation in all extremities.  Lap sites on abd look clean and dry CBG checked with result of 161. Vital signs stable (T-98.4 P-88 R-12-14 BP 137/75, Pulse Ox 98% on 2L-[place]).  Pt connected to rapid response EKG monitor which showed NSR at a rate of 88.  Pt remains drowsy.  Notified Respiratory Therapy to get ABG to check CO2 level, which is currently being done.  Pt now more awake when writing this note pt used call bell appropriately to call for staff on two different occasions.  Interventions:  Assessment, CBG, Vital Signs, temporally placed on EKG monitor, ABG, text page sent to Dr. Marcello Moores advising above.   Plan of Care (if not transferred):  If any changes in LOC or patients condition raises concern with primary nurse, please call rapid response nurse back to room by calling RRT phone [phone].  [facility] Care Coordinator / Rapid Response Nurse

## 2017-05-02 NOTE — H&P (Signed)
Consult Note  Cosign Needed  Date of Service:  05/02/2017  9:36 AM          Cosign Needed      Expand All Collapse All      _0 Hide copied text  _1 Hover for details   Reason for Consult: Abdominal pain with cholelithiasis. Referring Physician: Dr. Aileen Pilot   Traci Johnson is an 66 y.o. female.  HPI: Patient is a 66 year old female who presented to the ED on 04/27/2018 with complaints of chest pain with acute onset.  She was seen and admitted by medicine.  Troponins are negative on admission.  EKG was unremarkable.  She was seen in consultation by cardiology and a stress test was obtained.  Stress test was abnormal patient was subsequently scheduled for cardiac catheterization 04/30/17.  Cardiac catheterization showed normal coronaries with an EF of 55-60% with an ejection fraction of 55-65%.  Postop she had some hypotension was actually placed on dopamine for a vasovagal response.  Ultrasound during this admission showed multiple gallstones including a nonmobile 1.6 cm stone in the gallbladder neck with no wall thickening no sonographic Murphy's sign common bile duct was 5 mm.  She did not have evidence of acute cholecystitis and was subsequently discharged home yesterday.     She wanted a burger so they stopped at a cookout and got a hamburger on the way home.  Her husband took her home and then later went out to the grocery store.  When he came back she was bent over having the same pain she had before this time it was more in the right upper quadrant.  On admission 04/27/2017 patient noted patient was more midline substernal pain.   She presented to the emergency department at [facility] around 3 AM this morning.  They had spent 4 hours at [facility] ED and had not been seen.  At this time she was complaining of severe abdominal pain.   Workup shows she is afebrile mildly hypertensive.  Labs shows a normal CMP, lipase of 30 LFTs are normal.  WBC is 17.5,  hemoglobin 12.5 hematocrit 37, urinalysis is normal.  CT of the abdomen shows no acute intra-abdominal or pelvic process.  Early potential cirrhosis.  A repeat ultrasound shows multiple gallstones and sludge within the gallbladder lumen.  No gallbladder wall Pericholecystic fluid is noted.  There is a positive Murphy sign.  Common bile duct remains normal at 4.8 mm.  There are no focal lesions identified within the liver.  Patient continues to have abdominal pain requiring multiple doses Of IV pain medication in the ED and we are asked to see her in consult.  Patient subsequently be transferred to [facility] emergency department for evaluation and further treatment.         Past Medical History:  Diagnosis Date   Allergy     Asthma     Hypertension     Migraines      cluster      Past Surgical History:  Procedure Laterality Date   ABDOMINAL HYSTERECTOMY   1985   LEFT HEART CATH AND CORONARY ANGIOGRAPHY N/A 04/30/2017    Procedure: LEFT HEART CATH AND CORONARY ANGIOGRAPHY;  Surgeon: Lorretta Harp, MD;  Location: [facility];  Service: Cardiovascular;  Laterality: N/A;   LIGAMENT REPAIR Right      birth defect           Family History  Problem Relation Age of Onset  Dementia Mother     Cancer Father     CAD Neg Hx        Social History:  reports that  has never smoked. she has never used smokeless tobacco. She reports that she drinks alcohol. She reports that she does not use drugs.   Allergies:       Allergies  Allergen Reactions   Codeine Nausea And Vomiting   Lisinopril        cough             Prior to Admission medications   Medication Sig Start Date End Date Taking? Authorizing Provider  albuterol (PROVENTIL HFA;VENTOLIN HFA) 108 (90 BASE) MCG/ACT inhaler Inhale 2 puffs into the lungs every 6 (six) hours as needed for wheezing.       [provider]  aspirin 81 MG tablet Take 81 mg by mouth daily.       [provider]  cholecalciferol (VITAMIN D) 1000 units tablet Take 5,000 Units by mouth daily.       [provider]  cyclobenzaprine (FLEXERIL) 5 MG tablet TAKE ONE TABLET BY MOUTH THREE TIMES DAILY AS NEEDED FOR MUSCLE SPASM 09/24/14     Evelina Dun A, FNP  estradiol (ESTRACE) 1 MG tablet Take 0.5 mg by mouth daily.        [provider]  Fluticasone-Salmeterol (ADVAIR DISKUS) 100-50 MCG/DOSE AEPB INHALE ONE DOSE BY MOUTH EVERY 12 HOURS 07/15/14     Evelina Dun A, FNP  meloxicam (MOBIC) 15 MG tablet TAKE ONE TABLET BY MOUTH ONCE DAILY 09/24/14     Evelina Dun A, FNP  SUMAtriptan (IMITREX) 6 MG/0.5ML SOLN injection Inject 6 mg into the skin every 2 (two) hours as needed for migraine or headache. F       [provider]  vitamin B-12 (CYANOCOBALAMIN) 1000 MCG tablet Take 1,000 mcg by mouth daily.       [provider]  vitamin E (VITAMIN E) 400 UNIT capsule Take 400 Units by mouth daily.       [provider]        Lab Results Last 48 Hours  Results for orders placed or performed during the hospital encounter of 05/02/17 (from the past 48 hour(s))  Lipase, blood     Status: None    Collection Time: 05/02/17  2:15 AM  Result Value Ref Range    Lipase 30 11 - 51 U/L  CBC with Differential     Status: Abnormal    Collection Time: 05/02/17  2:15 AM  Result Value Ref Range    WBC 17.5 (H) 4.0 - 10.5 K/uL    RBC 4.34 3.87 - 5.11 MIL/uL    Hemoglobin 12.5 12.0 - 15.0 g/dL    HCT 37.5 36.0 - 46.0 %    MCV 86.4 78.0 - 100.0 fL    MCH 28.8 26.0 - 34.0 pg    MCHC 33.3 30.0 - 36.0 g/dL    RDW 12.9 11.5 - 15.5 %    Platelets 349 150 - 400 K/uL    Neutrophils Relative % 85 %    Lymphocytes Relative 10 %    Monocytes Relative 5 %    Eosinophils Relative 0 %    Basophils Relative 0 %    Neutro Abs 14.8 (H) 1.7 - 7.7 K/uL    Lymphs Abs 1.8 0.7 - 4.0 K/uL    Monocytes Absolute 0.9 0.1 - 1.0 K/uL    Eosinophils Absolute 0.0 0.0 -  0.7 K/uL     Basophils Absolute 0.0 0.0 - 0.1 K/uL    Smear Review MORPHOLOGY UNREMARKABLE    Comprehensive metabolic panel     Status: Abnormal    Collection Time: 05/02/17  2:15 AM  Result Value Ref Range    Sodium 137 135 - 145 mmol/L    Potassium 3.8 3.5 - 5.1 mmol/L    Chloride 106 101 - 111 mmol/L    CO2 23 22 - 32 mmol/L    Glucose, Bld 144 (H) 65 - 99 mg/dL    BUN 25 (H) 6 - 20 mg/dL    Creatinine, Ser 0.51 0.44 - 1.00 mg/dL    Calcium 10.0 8.9 - 10.3 mg/dL    Total Protein 7.4 6.5 - 8.1 g/dL    Albumin 3.7 3.5 - 5.0 g/dL    AST 40 15 - 41 U/L    ALT 32 14 - 54 U/L    Alkaline Phosphatase 93 38 - 126 U/L    Total Bilirubin 0.3 0.3 - 1.2 mg/dL    GFR calc non Af Amer >60 >60 mL/min    GFR calc Af Amer >60 >60 mL/min      Comment: (NOTE) The eGFR has been calculated using the CKD EPI equation. This calculation has not been validated in all clinical situations. eGFR's persistently <60 mL/min signify possible Chronic Kidney Disease.      Anion gap 8 5 - 15  Urinalysis, Routine w reflex microscopic     Status: Abnormal    Collection Time: 05/02/17  6:39 AM  Result Value Ref Range    Color, Urine STRAW (A) YELLOW    APPearance CLOUDY (A) CLEAR    Specific Gravity, Urine 1.010 1.005 - 1.030    pH 7.5 5.0 - 8.0    Glucose, UA NEGATIVE NEGATIVE mg/dL    Hgb urine dipstick TRACE (A) NEGATIVE    Bilirubin Urine NEGATIVE NEGATIVE    Ketones, ur NEGATIVE NEGATIVE mg/dL    Protein, ur NEGATIVE NEGATIVE mg/dL    Nitrite NEGATIVE NEGATIVE    Leukocytes, UA NEGATIVE NEGATIVE  Urinalysis, Microscopic (reflex)     Status: Abnormal    Collection Time: 05/02/17  6:39 AM  Result Value Ref Range    RBC / HPF 0-5 0 - 5 RBC/hpf    WBC, UA 0-5 0 - 5 WBC/hpf    Bacteria, UA NONE SEEN NONE SEEN    Squamous Epithelial / LPF 0-5 (A) NONE SEEN    Amorphous Crystal PRESENT    I-Stat CG4 Lactic Acid, ED     Status: Abnormal    Collection Time: 05/02/17  7:04 AM  Result Value Ref Range    Lactic Acid,  Venous 2.44 (HH) 0.5 - 1.9 mmol/L    Comment NOTIFIED PHYSICIAN    I-Stat CG4 Lactic Acid, ED     Status: None    Collection Time: 05/02/17  9:12 AM  Result Value Ref Range    Lactic Acid, Venous 1.73 0.5 - 1.9 mmol/L         Imaging Results (Last 48 hours)  Ct Abdomen Pelvis W Contrast   Result Date: 05/02/2017 CLINICAL DATA:  Episodic RIGHT upper quadrant pain for 6 days. History of cholelithiasis, hysterectomy. EXAM: CT ABDOMEN AND PELVIS WITH CONTRAST TECHNIQUE: Multidetector CT imaging of the abdomen and pelvis was performed using the standard protocol following bolus administration of intravenous contrast. CONTRAST:  150m ISOVUE-300 IOPAMIDOL (ISOVUE-300) INJECTION 61% COMPARISON:  Abdominal ultrasound April 29, 2017 FINDINGS: LOWER  CHEST: 3 mm LEFT lower lobe sub solid pleural based nodule, likely benign and no routine indicated follow-up in subcentimeter lymph node RIGHT costophrenic angle. Included heart size is normal. No pericardial effusion. HEPATOBILIARY: Slightly nodular RIGHT hepatic contour with focal fatty infiltration about the falciform ligament. Distended gallbladder. Known cholelithiasis are isodense by CT. No gallbladder wall thickening or pericholecystic fluid. PANCREAS: Normal. SPLEEN: Normal. ADRENALS/URINARY TRACT: Kidneys are orthotopic, demonstrating symmetric enhancement. No nephrolithiasis, hydronephrosis or solid renal masses. The unopacified ureters are normal in course and caliber. Delayed imaging through the kidneys demonstrates symmetric prompt contrast excretion within the proximal urinary collecting system. Urinary bladder is decompressed and unremarkable. Normal adrenal glands. STOMACH/BOWEL: Small hiatal hernia. The stomach, small and large bowel are normal in course and caliber without inflammatory changes. Enteric contrast has not yet reached the distal small bowel. Small amount of small bowel feces compatible with chronic stasis. Moderate descending and  sigmoid colonic diverticulosis. Mild amount of retained large bowel stool. Normal appendix. VASCULAR/LYMPHATIC: Aortoiliac vessels are normal in course and caliber. Mild calcific atherosclerosis. No lymphadenopathy by CT size criteria. REPRODUCTIVE: Status post hysterectomy. OTHER: No intraperitoneal free fluid or free air. MUSCULOSKELETAL: Nonacute.  Mild degenerative change of the hips. IMPRESSION: 1. No acute intra-abdominal or pelvic process. 2. Early potential cirrhosis. Recommend correlation with liver function tests on nonemergent basis. Electronically Signed   By: Elon Alas M.D.   On: 05/02/2017 04:28    US Abdomen Limited Ruq   Result Date: 05/02/2017 CLINICAL DATA:  Acute right upper quadrant abdominal pain. EXAM: ULTRASOUND ABDOMEN LIMITED RIGHT UPPER QUADRANT COMPARISON:  CT scan of same day.  Ultrasound of April 29, 2017. FINDINGS: Gallbladder: Multiple gallstones and sludge are noted within gallbladder lumen. No gallbladder wall thickening or pericholecystic fluid is noted. Positive sonographic Murphy's sign is noted. Common bile duct: Diameter: 4.8 mm which is within normal limits. Liver: No focal lesion identified. Within normal limits in parenchymal echogenicity. Portal vein is patent on color Doppler imaging with normal direction of blood flow towards the liver. IMPRESSION: Cholelithiasis and sludge is noted within gallbladder lumen without gallbladder wall thickening or pericholecystic fluid. Positive sonographic Percell Miller sign is noted, and if there is clinical concern for cholecystitis, HIDA scan would be recommended for further evaluation. Electronically Signed   By: Marijo Conception, M.D.   On: 05/02/2017 09:05       Review of Systems  Constitutional: Negative for chills, diaphoresis, fever, malaise/fatigue and weight loss.  HENT: Negative.   Eyes: Negative.   Respiratory: Negative.   Cardiovascular: Negative.   Gastrointestinal: Positive for abdominal pain, diarrhea,  nausea and vomiting. Negative for blood in stool, constipation and melena.  Genitourinary: Negative.   Musculoskeletal: Positive for back pain. Negative for falls, joint pain, myalgias and neck pain (some back pain with the chest/RUQ pain).  Skin: Negative.   Neurological: Negative.  Negative for weakness.  Endo/Heme/Allergies: Negative.   Psychiatric/Behavioral: The patient is nervous/anxious.     Blood pressure (!) 157/88, pulse 90, temperature (!) 97.4 F (36.3 C), temperature source Oral, resp. rate 18, height '5\' 3"'$  (1.6 m), weight 83 kg (183 lb), SpO2 95 %. Physical Exam  Constitutional: She is oriented to person, place, and time. She appears well-developed and well-nourished. No distress.  Actually looks pretty uncomfortable, with ongoing right upper quadrant pain.  HENT:  Head: Normocephalic and atraumatic.  Mouth/Throat: No oropharyngeal exudate.  Eyes: Right eye exhibits no discharge. Left eye exhibits no discharge. No scleral icterus.  Pupils are  equal  Neck: Normal range of motion. No JVD present. No tracheal deviation present. No thyromegaly present.  Cardiovascular: Normal rate, regular rhythm, normal heart sounds and intact distal pulses. Exam reveals no gallop and no friction rub.  No murmur heard. Respiratory: Effort normal and breath sounds normal. No respiratory distress. She has no wheezes. She has no rales. She exhibits no tenderness.  GI: She exhibits no distension and no mass. There is tenderness (Right upper quadrant). There is no rebound and no guarding.  Musculoskeletal: She exhibits no edema or tenderness.  Lymphadenopathy:    She has no cervical adenopathy.  Neurological: She is alert and oriented to person, place, and time. No cranial nerve deficit.  Skin: Skin is warm and dry. No rash noted. She is not diaphoretic. No erythema. There is pallor.  Psychiatric: She has a normal mood and affect. Her behavior is normal. Judgment and thought content normal.       Assessment/Plan: Symptomatic cholelithiasis/cholecystitis. Chest pain with normal cardiac catheterization 04/30/12 Hypertension History of migraines IBS with loose stools.   Plan: We will admit the patient, hydrate her, IV fluids IV antibiotics.  Laparoscopic cholecystectomy later today.   Katrin Grabel 05/02/2017, 9:36 AM

## 2017-05-02 NOTE — Progress Notes (Signed)
Patient experienced a brief period of unresponsiveness while assisted to the [facility].  Patient became diaphoretic, pale, sluggish.  Assist of 3 needed to transfer back to bed.  Vital signs checked and O 2 applied.  Continuous pulse ox initiated. On-call Dr notified of event.

## 2017-05-02 NOTE — H&P (Deleted)
Expand All Collapse All            Expand widget buttonCollapse widget button     Hide copied text   Hover for detailscustomization button                                                                                 Reason for Consult: Abdominal pain with cholelithiasis.  Referring Physician: Dr. Aileen Pilot     Traci Johnson is an 66 y.o. female.   HPI: Patient is a 66 year old female who presented to the ED on 04/27/2018 with complaints of chest pain with acute onset.  She was seen and admitted by medicine.  Troponins are negative on admission.  EKG was unremarkable.  She was seen in consultation by cardiology and a stress test was obtained.  Stress test was abnormal patient was subsequently scheduled for cardiac catheterization 04/30/17.  Cardiac catheterization showed normal coronaries with an EF of 55-60% with an ejection fraction of 55-65%.  Postop she had some hypotension was actually placed on dopamine for a vasovagal response.  Ultrasound during this admission showed multiple gallstones including a nonmobile 1.6 cm stone in the gallbladder neck with no wall thickening no sonographic Murphy's sign common bile duct was 5 mm.  She did not have evidence of acute cholecystitis and was subsequently discharged home yesterday.       She wanted a burger so they stopped at a cookout and got a hamburger on the way home.  Her husband took her home and then later went out to the grocery store.  When he came back she was bent over having the same pain she had before this time it was more in the right upper quadrant.  On admission 04/27/2017 patient noted patient was more midline substernal pain.    She presented to the emergency department at [facility] around 3 AM this morning.  They had spent 4 hours at [facility] ED and had not been seen.  At this time she was complaining of  severe abdominal pain.     Workup shows she is afebrile mildly hypertensive.  Labs shows a normal CMP, lipase of 30 LFTs are normal.  WBC is 17.5, hemoglobin 12.5 hematocrit 37, urinalysis is normal.  CT of the abdomen shows no acute intra-abdominal or pelvic process.  Early potential cirrhosis.  A repeat ultrasound shows multiple gallstones and sludge within the gallbladder lumen.  No gallbladder wall  Pericholecystic fluid is noted.  There is a positive Murphy sign.  Common bile duct remains normal at 4.8 mm.  There are no focal lesions identified within the liver.  Patient continues to have abdominal pain requiring multiple doses  Of IV pain medication in the ED and we are asked to see her in consult.  Patient subsequently be transferred to [facility] emergency department for evaluation and further treatment.             Past Medical History:    Diagnosis   Date       Allergy           Asthma  Hypertension           Migraines            cluster                Past Surgical History:    Procedure   Laterality   Date       ABDOMINAL HYSTERECTOMY       1985       LEFT HEART CATH AND CORONARY ANGIOGRAPHY   N/A   04/30/2017        Procedure: LEFT HEART CATH AND CORONARY ANGIOGRAPHY;  Surgeon: Lorretta Harp, MD;  Location: [place] [facility];  Service: Cardiovascular;  Laterality: N/A;       LIGAMENT REPAIR   Right            birth defect                Family History    Problem   Relation   Age of Onset       Dementia   Mother           Cancer   Father           CAD   Neg Hx              Social History:  reports that  has never smoked. she has never used smokeless tobacco. She reports that she drinks alcohol. She reports that she does not use drugs.     Allergies:         Allergies    Allergen   Reactions        Codeine   Nausea And Vomiting       Lisinopril                cough                  Prior to Admission medications     Medication   Sig   Start Date   End Date   Taking?   Authorizing Provider    albuterol (PROVENTIL HFA;VENTOLIN HFA) 108 (90 BASE) MCG/ACT inhaler   Inhale 2 puffs into the lungs every 6 (six) hours as needed for wheezing.               [provider]    aspirin 81 MG tablet   Take 81 mg by mouth daily.               [provider]    cholecalciferol (VITAMIN D) 1000 units tablet   Take 5,000 Units by mouth daily.               [provider]    cyclobenzaprine (FLEXERIL) 5 MG tablet   TAKE ONE TABLET BY MOUTH THREE TIMES DAILY AS NEEDED FOR MUSCLE SPASM   09/24/14           Evelina Dun A, FNP    estradiol (ESTRACE) 1 MG tablet   Take 0.5 mg by mouth daily.                [provider]    Fluticasone-Salmeterol (ADVAIR DISKUS) 100-50 MCG/DOSE AEPB   INHALE ONE DOSE BY MOUTH EVERY 12 HOURS   07/15/14           Evelina Dun A, FNP    meloxicam (MOBIC) 15 MG tablet   TAKE ONE TABLET BY MOUTH ONCE DAILY   09/24/14  Hawks, Christy A, FNP    SUMAtriptan (IMITREX) 6 MG/0.5ML SOLN injection   Inject 6 mg into the skin every 2 (two) hours as needed for migraine or headache. F               [provider]    vitamin B-12 (CYANOCOBALAMIN) 1000 MCG tablet   Take 1,000 mcg by mouth daily.               [provider]    vitamin E (VITAMIN E) 400 UNIT capsule   Take 400 Units by mouth daily.               [provider]             Lab Results Last 48 Hours           Results for orders placed or performed during the hospital encounter of 05/02/17 (from the past 48 hour(s))    Lipase, blood     Status: None        Collection Time: 05/02/17   2:15 AM    Result   Value   Ref Range        Lipase   30   11 - 51 U/L    CBC with Differential     Status: Abnormal        Collection Time: 05/02/17  2:15 AM    Result   Value   Ref Range        WBC   17.5 (H)   4.0 - 10.5 K/uL        RBC   4.34   3.87 - 5.11 MIL/uL        Hemoglobin   12.5   12.0 - 15.0 g/dL        HCT   37.5   36.0 - 46.0 %        MCV   86.4   78.0 - 100.0 fL        MCH   28.8   26.0 - 34.0 pg        MCHC   33.3   30.0 - 36.0 g/dL        RDW   12.9   11.5 - 15.5 %        Platelets   349   150 - 400 K/uL        Neutrophils Relative %   85   %        Lymphocytes Relative   10   %        Monocytes Relative   5   %        Eosinophils Relative   0   %        Basophils Relative   0   %        Neutro Abs   14.8 (H)   1.7 - 7.7 K/uL        Lymphs Abs   1.8   0.7 - 4.0 K/uL        Monocytes Absolute   0.9   0.1 - 1.0 K/uL        Eosinophils Absolute   0.0   0.0 - 0.7 K/uL        Basophils Absolute   0.0   0.0 - 0.1 K/uL        Smear Review   MORPHOLOGY UNREMARKABLE        Comprehensive metabolic panel     Status:  Abnormal        Collection Time: 05/02/17  2:15 AM    Result   Value   Ref Range        Sodium   137   135 - 145 mmol/L        Potassium   3.8   3.5 - 5.1 mmol/L        Chloride   106   101 - 111 mmol/L        CO2   23   22 - 32 mmol/L        Glucose, Bld   144 (H)   65 - 99 mg/dL        BUN   25 (H)   6 - 20 mg/dL        Creatinine, Ser   0.51   0.44 - 1.00 mg/dL        Calcium   10.0   8.9 - 10.3 mg/dL        Total Protein   7.4   6.5 - 8.1 g/dL        Albumin   3.7   3.5 - 5.0 g/dL        AST   40   15 - 41 U/L        ALT   32   14 - 54 U/L        Alkaline  Phosphatase   93   38 - 126 U/L        Total Bilirubin   0.3   0.3 - 1.2 mg/dL        GFR calc non Af Amer   >60   >60 mL/min        GFR calc Af Amer   >60   >60 mL/min            Comment:   (NOTE)  The eGFR has been calculated using the CKD EPI equation.  This calculation has not been validated in all clinical situations.  eGFR's persistently <60 mL/min signify possible Chronic Kidney  Disease.           Anion gap   8   5 - 15    Urinalysis, Routine w reflex microscopic     Status: Abnormal        Collection Time: 05/02/17  6:39 AM    Result   Value   Ref Range        Color, Urine   STRAW (A)   YELLOW        APPearance   CLOUDY (A)   CLEAR        Specific Gravity, Urine   1.010   1.005 - 1.030        pH   7.5   5.0 - 8.0        Glucose, UA   NEGATIVE   NEGATIVE mg/dL        Hgb urine dipstick   TRACE (A)   NEGATIVE        Bilirubin Urine   NEGATIVE   NEGATIVE        Ketones, ur   NEGATIVE   NEGATIVE mg/dL        Protein, ur   NEGATIVE   NEGATIVE mg/dL        Nitrite   NEGATIVE   NEGATIVE        Leukocytes, UA   NEGATIVE   NEGATIVE    Urinalysis, Microscopic (reflex)     Status: Abnormal  Collection Time: 05/02/17  6:39 AM    Result   Value   Ref Range        RBC / HPF   0-5   0 - 5 RBC/hpf        WBC, UA   0-5   0 - 5 WBC/hpf        Bacteria, UA   NONE SEEN   NONE SEEN        Squamous Epithelial / LPF   0-5 (A)   NONE SEEN        Amorphous Crystal   PRESENT        I-Stat CG4 Lactic Acid, ED     Status: Abnormal        Collection Time: 05/02/17  7:04 AM    Result   Value   Ref Range        Lactic Acid, Venous   2.44 (HH)   0.5 - 1.9 mmol/L        Comment   NOTIFIED PHYSICIAN        I-Stat CG4 Lactic Acid, ED     Status: None         Collection Time: 05/02/17  9:12 AM    Result   Value   Ref Range        Lactic Acid, Venous   1.73   0.5 - 1.9 mmol/L              Imaging Results (Last 48 hours)    Ct Abdomen Pelvis W Contrast     Result Date: 05/02/2017  CLINICAL DATA:  Episodic RIGHT upper quadrant pain for 6 days. History of cholelithiasis, hysterectomy. EXAM: CT ABDOMEN AND PELVIS WITH CONTRAST TECHNIQUE: Multidetector CT imaging of the abdomen and pelvis was performed using the standard protocol following bolus administration of intravenous contrast. CONTRAST:  162m ISOVUE-300 IOPAMIDOL (ISOVUE-300) INJECTION 61% COMPARISON:  Abdominal ultrasound April 29, 2017 FINDINGS: LOWER CHEST: 3 mm LEFT lower lobe sub solid pleural based nodule, likely benign and no routine indicated follow-up in subcentimeter lymph node RIGHT costophrenic angle. Included heart size is normal. No pericardial effusion. HEPATOBILIARY: Slightly nodular RIGHT hepatic contour with focal fatty infiltration about the falciform ligament. Distended gallbladder. Known cholelithiasis are isodense by CT. No gallbladder wall thickening or pericholecystic fluid. PANCREAS: Normal. SPLEEN: Normal. ADRENALS/URINARY TRACT: Kidneys are orthotopic, demonstrating symmetric enhancement. No nephrolithiasis, hydronephrosis or solid renal masses. The unopacified ureters are normal in course and caliber. Delayed imaging through the kidneys demonstrates symmetric prompt contrast excretion within the proximal urinary collecting system. Urinary bladder is decompressed and unremarkable. Normal adrenal glands. STOMACH/BOWEL: Small hiatal hernia. The stomach, small and large bowel are normal in course and caliber without inflammatory changes. Enteric contrast has not yet reached the distal small bowel. Small amount of small bowel feces compatible with chronic stasis. Moderate descending and sigmoid colonic diverticulosis. Mild amount of retained large bowel  stool. Normal appendix. VASCULAR/LYMPHATIC: Aortoiliac vessels are normal in course and caliber. Mild calcific atherosclerosis. No lymphadenopathy by CT size criteria. REPRODUCTIVE: Status post hysterectomy. OTHER: No intraperitoneal free fluid or free air. MUSCULOSKELETAL: Nonacute.  Mild degenerative change of the hips. IMPRESSION: 1. No acute intra-abdominal or pelvic process. 2. Early potential cirrhosis. Recommend correlation with liver function tests on nonemergent basis. Electronically Signed   By: CElon AlasM.D.   On: 05/02/2017 04:28      UKoreaAbdomen Limited Ruq     Result Date: 05/02/2017  CLINICAL DATA:  Acute right upper quadrant  abdominal pain. EXAM: ULTRASOUND ABDOMEN LIMITED RIGHT UPPER QUADRANT COMPARISON:  CT scan of same day.  Ultrasound of April 29, 2017. FINDINGS: Gallbladder: Multiple gallstones and sludge are noted within gallbladder lumen. No gallbladder wall thickening or pericholecystic fluid is noted. Positive sonographic Murphy's sign is noted. Common bile duct: Diameter: 4.8 mm which is within normal limits. Liver: No focal lesion identified. Within normal limits in parenchymal echogenicity. Portal vein is patent on color Doppler imaging with normal direction of blood flow towards the liver. IMPRESSION: Cholelithiasis and sludge is noted within gallbladder lumen without gallbladder wall thickening or pericholecystic fluid. Positive sonographic Percell Miller sign is noted, and if there is clinical concern for cholecystitis, HIDA scan would be recommended for further evaluation. Electronically Signed   By: Marijo Conception, M.D.   On: 05/02/2017 09:05           Review of Systems   Constitutional: Negative for chills, diaphoresis, fever, malaise/fatigue and weight loss.   HENT: Negative.    Eyes: Negative.    Respiratory: Negative.    Cardiovascular: Negative.    Gastrointestinal: Positive for abdominal pain, diarrhea, nausea and vomiting. Negative for blood in  stool, constipation and melena.   Genitourinary: Negative.    Musculoskeletal: Positive for back pain. Negative for falls, joint pain, myalgias and neck pain (some back pain with the chest/RUQ pain).   Skin: Negative.    Neurological: Negative.  Negative for weakness.   Endo/Heme/Allergies: Negative.    Psychiatric/Behavioral: The patient is nervous/anxious.       Blood pressure (!) 157/88, pulse 90, temperature (!) 97.4 F (36.3 C), temperature source Oral, resp. rate 18, height 5' 3" (1.6 m), weight 83 kg (183 lb), SpO2 95 %.  Physical Exam   Constitutional: She is oriented to person, place, and time. She appears well-developed and well-nourished. No distress.  Actually looks pretty uncomfortable, with ongoing right upper quadrant pain.   HENT:   Head: Normocephalic and atraumatic.   Mouth/Throat: No oropharyngeal exudate.   Eyes: Right eye exhibits no discharge. Left eye exhibits no discharge. No scleral icterus.  Pupils are equal   Neck: Normal range of motion. No JVD present. No tracheal deviation present. No thyromegaly present.   Cardiovascular: Normal rate, regular rhythm, normal heart sounds and intact distal pulses. Exam reveals no gallop and no friction rub.   No murmur heard.  Respiratory: Effort normal and breath sounds normal. No respiratory distress. She has no wheezes. She has no rales. She exhibits no tenderness.   GI: She exhibits no distension and no mass. There is tenderness (Right upper quadrant). There is no rebound and no guarding.  Musculoskeletal: She exhibits no edema or tenderness.  Lymphadenopathy:    She has no cervical adenopathy.  Neurological: She is alert and oriented to person, place, and time. No cranial nerve deficit.   Skin: Skin is warm and dry. No rash noted. She is not diaphoretic. No erythema. There is pallor.  Psychiatric: She has a normal mood and affect. Her behavior is normal. Judgment and thought content normal.          Assessment/Plan:  Symptomatic cholelithiasis/cholecystitis.  Chest pain with normal cardiac catheterization 04/30/12  Hypertension  History of migraines  IBS with loose stools.     Plan: We will admit the patient, hydrate her, IV fluids IV antibiotics.  Laparoscopic cholecystectomy later today.     JENNINGS,WILLARD  05/02/2017, 9:36 AM  Consult Note  Cosign Needed  Date of Service:  05/02/2017  9:36 AM          Cosign Needed      Expand All Collapse All     []Hide copied text  []Hover for details   Reason for Consult: Abdominal pain with cholelithiasis. Referring Physician: Dr. Aileen Pilot   Traci Johnson is an 66 y.o. female.  HPI: Patient is a 66 year old female who presented to the ED on 04/27/2018 with complaints of chest pain with acute onset.  She was seen and admitted by medicine.  Troponins are negative on admission.  EKG was unremarkable.  She was seen in consultation by cardiology and a stress test was obtained.  Stress test was abnormal patient was subsequently scheduled for cardiac catheterization 04/30/17.  Cardiac catheterization showed normal coronaries with an EF of 55-60% with an ejection fraction of 55-65%.  Postop she had some hypotension was actually placed on dopamine for a vasovagal response.  Ultrasound during this admission showed multiple gallstones including a nonmobile 1.6 cm stone in the gallbladder neck with no wall thickening no sonographic Murphy's sign common bile duct was 5 mm.  She did not have evidence of acute cholecystitis and was subsequently discharged home yesterday.     She wanted a burger so they stopped at a cookout and got a hamburger on the way home.  Her husband took her home and then later went out to the grocery store.  When he came back she was bent over having the same pain she had before this time it was more in the right upper quadrant.  On admission 04/27/2017 patient noted patient was  more midline substernal pain.   She presented to the emergency department at [facility] around 3 AM this morning.  They had spent 4 hours at [facility] ED and had not been seen.  At this time she was complaining of severe abdominal pain.   Workup shows she is afebrile mildly hypertensive.  Labs shows a normal CMP, lipase of 30 LFTs are normal.  WBC is 17.5, hemoglobin 12.5 hematocrit 37, urinalysis is normal.  CT of the abdomen shows no acute intra-abdominal or pelvic process.  Early potential cirrhosis.  A repeat ultrasound shows multiple gallstones and sludge within the gallbladder lumen.  No gallbladder wall Pericholecystic fluid is noted.  There is a positive Murphy sign.  Common bile duct remains normal at 4.8 mm.  There are no focal lesions identified within the liver.  Patient continues to have abdominal pain requiring multiple doses Of IV pain medication in the ED and we are asked to see her in consult.  Patient subsequently be transferred to [facility] emergency department for evaluation and further treatment.         Past Medical History:  Diagnosis Date   Allergy     Asthma     Hypertension     Migraines      cluster      Past Surgical History:  Procedure Laterality Date   ABDOMINAL HYSTERECTOMY   1985   LEFT HEART CATH AND CORONARY ANGIOGRAPHY N/A 04/30/2017    Procedure: LEFT HEART CATH AND CORONARY ANGIOGRAPHY;  Surgeon: Lorretta Harp, MD;  Location: [place] [facility];  Service: Cardiovascular;  Laterality: N/A;   LIGAMENT REPAIR Right      birth defect           Family History  Problem Relation Age of Onset   Dementia  Mother     Cancer Father     CAD Neg Hx        Social History:  reports that  has never smoked. she has never used smokeless tobacco. She reports that she drinks alcohol. She reports that she does not use drugs.   Allergies:       Allergies  Allergen Reactions   Codeine Nausea And Vomiting    Lisinopril        cough             Prior to Admission medications   Medication Sig Start Date End Date Taking? Authorizing Provider  albuterol (PROVENTIL HFA;VENTOLIN HFA) 108 (90 BASE) MCG/ACT inhaler Inhale 2 puffs into the lungs every 6 (six) hours as needed for wheezing.       [provider]  aspirin 81 MG tablet Take 81 mg by mouth daily.       [provider]  cholecalciferol (VITAMIN D) 1000 units tablet Take 5,000 Units by mouth daily.       [provider]  cyclobenzaprine (FLEXERIL) 5 MG tablet TAKE ONE TABLET BY MOUTH THREE TIMES DAILY AS NEEDED FOR MUSCLE SPASM 09/24/14     Evelina Dun A, FNP  estradiol (ESTRACE) 1 MG tablet Take 0.5 mg by mouth daily.        [provider]  Fluticasone-Salmeterol (ADVAIR DISKUS) 100-50 MCG/DOSE AEPB INHALE ONE DOSE BY MOUTH EVERY 12 HOURS 07/15/14     Evelina Dun A, FNP  meloxicam (MOBIC) 15 MG tablet TAKE ONE TABLET BY MOUTH ONCE DAILY 09/24/14     Evelina Dun A, FNP  SUMAtriptan (IMITREX) 6 MG/0.5ML SOLN injection Inject 6 mg into the skin every 2 (two) hours as needed for migraine or headache. F       [provider]  vitamin B-12 (CYANOCOBALAMIN) 1000 MCG tablet Take 1,000 mcg by mouth daily.       [provider]  vitamin E (VITAMIN E) 400 UNIT capsule Take 400 Units by mouth daily.       [provider]        Lab Results Last 48 Hours  Results for orders placed or performed during the hospital encounter of 05/02/17 (from the past 48 hour(s))  Lipase, blood     Status: None    Collection Time: 05/02/17  2:15 AM  Result Value Ref Range    Lipase 30 11 - 51 U/L  CBC with Differential     Status: Abnormal    Collection Time: 05/02/17  2:15 AM  Result Value Ref Range    WBC 17.5 (H) 4.0 - 10.5 K/uL    RBC 4.34 3.87 - 5.11 MIL/uL    Hemoglobin 12.5 12.0 - 15.0 g/dL    HCT 37.5 36.0 - 46.0 %    MCV 86.4 78.0 - 100.0 fL    MCH 28.8 26.0 - 34.0 pg    MCHC 33.3 30.0 -  36.0 g/dL    RDW 12.9 11.5 - 15.5 %    Platelets 349 150 - 400 K/uL    Neutrophils Relative % 85 %    Lymphocytes Relative 10 %    Monocytes Relative 5 %    Eosinophils Relative 0 %    Basophils Relative 0 %    Neutro Abs 14.8 (H) 1.7 - 7.7 K/uL    Lymphs Abs 1.8 0.7 - 4.0 K/uL    Monocytes Absolute 0.9 0.1 - 1.0 K/uL    Eosinophils Absolute 0.0 0.0 -  0.7 K/uL    Basophils Absolute 0.0 0.0 - 0.1 K/uL    Smear Review MORPHOLOGY UNREMARKABLE    Comprehensive metabolic panel     Status: Abnormal    Collection Time: 05/02/17  2:15 AM  Result Value Ref Range    Sodium 137 135 - 145 mmol/L    Potassium 3.8 3.5 - 5.1 mmol/L    Chloride 106 101 - 111 mmol/L    CO2 23 22 - 32 mmol/L    Glucose, Bld 144 (H) 65 - 99 mg/dL    BUN 25 (H) 6 - 20 mg/dL    Creatinine, Ser 0.51 0.44 - 1.00 mg/dL    Calcium 10.0 8.9 - 10.3 mg/dL    Total Protein 7.4 6.5 - 8.1 g/dL    Albumin 3.7 3.5 - 5.0 g/dL    AST 40 15 - 41 U/L    ALT 32 14 - 54 U/L    Alkaline Phosphatase 93 38 - 126 U/L    Total Bilirubin 0.3 0.3 - 1.2 mg/dL    GFR calc non Af Amer >60 >60 mL/min    GFR calc Af Amer >60 >60 mL/min      Comment: (NOTE) The eGFR has been calculated using the CKD EPI equation. This calculation has not been validated in all clinical situations. eGFR's persistently <60 mL/min signify possible Chronic Kidney Disease.      Anion gap 8 5 - 15  Urinalysis, Routine w reflex microscopic     Status: Abnormal    Collection Time: 05/02/17  6:39 AM  Result Value Ref Range    Color, Urine STRAW (A) YELLOW    APPearance CLOUDY (A) CLEAR    Specific Gravity, Urine 1.010 1.005 - 1.030    pH 7.5 5.0 - 8.0    Glucose, UA NEGATIVE NEGATIVE mg/dL    Hgb urine dipstick TRACE (A) NEGATIVE    Bilirubin Urine NEGATIVE NEGATIVE    Ketones, ur NEGATIVE NEGATIVE mg/dL    Protein, ur NEGATIVE NEGATIVE mg/dL    Nitrite NEGATIVE NEGATIVE    Leukocytes, UA NEGATIVE NEGATIVE  Urinalysis, Microscopic (reflex)     Status:  Abnormal    Collection Time: 05/02/17  6:39 AM  Result Value Ref Range    RBC / HPF 0-5 0 - 5 RBC/hpf    WBC, UA 0-5 0 - 5 WBC/hpf    Bacteria, UA NONE SEEN NONE SEEN    Squamous Epithelial / LPF 0-5 (A) NONE SEEN    Amorphous Crystal PRESENT    I-Stat CG4 Lactic Acid, ED     Status: Abnormal    Collection Time: 05/02/17  7:04 AM  Result Value Ref Range    Lactic Acid, Venous 2.44 (HH) 0.5 - 1.9 mmol/L    Comment NOTIFIED PHYSICIAN    I-Stat CG4 Lactic Acid, ED     Status: None    Collection Time: 05/02/17  9:12 AM  Result Value Ref Range    Lactic Acid, Venous 1.73 0.5 - 1.9 mmol/L         Imaging Results (Last 48 hours)  Ct Abdomen Pelvis W Contrast   Result Date: 05/02/2017 CLINICAL DATA:  Episodic RIGHT upper quadrant pain for 6 days. History of cholelithiasis, hysterectomy. EXAM: CT ABDOMEN AND PELVIS WITH CONTRAST TECHNIQUE: Multidetector CT imaging of the abdomen and pelvis was performed using the standard protocol following bolus administration of intravenous contrast. CONTRAST:  160m ISOVUE-300 IOPAMIDOL (ISOVUE-300) INJECTION 61% COMPARISON:  Abdominal ultrasound April 29, 2017 FINDINGS: LOWER CHEST:  3 mm LEFT lower lobe sub solid pleural based nodule, likely benign and no routine indicated follow-up in subcentimeter lymph node RIGHT costophrenic angle. Included heart size is normal. No pericardial effusion. HEPATOBILIARY: Slightly nodular RIGHT hepatic contour with focal fatty infiltration about the falciform ligament. Distended gallbladder. Known cholelithiasis are isodense by CT. No gallbladder wall thickening or pericholecystic fluid. PANCREAS: Normal. SPLEEN: Normal. ADRENALS/URINARY TRACT: Kidneys are orthotopic, demonstrating symmetric enhancement. No nephrolithiasis, hydronephrosis or solid renal masses. The unopacified ureters are normal in course and caliber. Delayed imaging through the kidneys demonstrates symmetric prompt contrast excretion within the proximal  urinary collecting system. Urinary bladder is decompressed and unremarkable. Normal adrenal glands. STOMACH/BOWEL: Small hiatal hernia. The stomach, small and large bowel are normal in course and caliber without inflammatory changes. Enteric contrast has not yet reached the distal small bowel. Small amount of small bowel feces compatible with chronic stasis. Moderate descending and sigmoid colonic diverticulosis. Mild amount of retained large bowel stool. Normal appendix. VASCULAR/LYMPHATIC: Aortoiliac vessels are normal in course and caliber. Mild calcific atherosclerosis. No lymphadenopathy by CT size criteria. REPRODUCTIVE: Status post hysterectomy. OTHER: No intraperitoneal free fluid or free air. MUSCULOSKELETAL: Nonacute.  Mild degenerative change of the hips. IMPRESSION: 1. No acute intra-abdominal or pelvic process. 2. Early potential cirrhosis. Recommend correlation with liver function tests on nonemergent basis. Electronically Signed   By: Elon Alas M.D.   On: 05/02/2017 04:28    US Abdomen Limited Ruq   Result Date: 05/02/2017 CLINICAL DATA:  Acute right upper quadrant abdominal pain. EXAM: ULTRASOUND ABDOMEN LIMITED RIGHT UPPER QUADRANT COMPARISON:  CT scan of same day.  Ultrasound of April 29, 2017. FINDINGS: Gallbladder: Multiple gallstones and sludge are noted within gallbladder lumen. No gallbladder wall thickening or pericholecystic fluid is noted. Positive sonographic Murphy's sign is noted. Common bile duct: Diameter: 4.8 mm which is within normal limits. Liver: No focal lesion identified. Within normal limits in parenchymal echogenicity. Portal vein is patent on color Doppler imaging with normal direction of blood flow towards the liver. IMPRESSION: Cholelithiasis and sludge is noted within gallbladder lumen without gallbladder wall thickening or pericholecystic fluid. Positive sonographic Percell Miller sign is noted, and if there is clinical concern for cholecystitis, HIDA scan would be  recommended for further evaluation. Electronically Signed   By: Marijo Conception, M.D.   On: 05/02/2017 09:05       Review of Systems  Constitutional: Negative for chills, diaphoresis, fever, malaise/fatigue and weight loss.  HENT: Negative.   Eyes: Negative.   Respiratory: Negative.   Cardiovascular: Negative.   Gastrointestinal: Positive for abdominal pain, diarrhea, nausea and vomiting. Negative for blood in stool, constipation and melena.  Genitourinary: Negative.   Musculoskeletal: Positive for back pain. Negative for falls, joint pain, myalgias and neck pain (some back pain with the chest/RUQ pain).  Skin: Negative.   Neurological: Negative.  Negative for weakness.  Endo/Heme/Allergies: Negative.   Psychiatric/Behavioral: The patient is nervous/anxious.     Blood pressure (!) 157/88, pulse 90, temperature (!) 97.4 F (36.3 C), temperature source Oral, resp. rate 18, height 5' 3" (1.6 m), weight 83 kg (183 lb), SpO2 95 %. Physical Exam  Constitutional: She is oriented to person, place, and time. She appears well-developed and well-nourished. No distress.  Actually looks pretty uncomfortable, with ongoing right upper quadrant pain.  HENT:  Head: Normocephalic and atraumatic.  Mouth/Throat: No oropharyngeal exudate.  Eyes: Right eye exhibits no discharge. Left eye exhibits no discharge. No scleral icterus.  Pupils are equal  Neck: Normal range of motion. No JVD present. No tracheal deviation present. No thyromegaly present.  Cardiovascular: Normal rate, regular rhythm, normal heart sounds and intact distal pulses. Exam reveals no gallop and no friction rub.  No murmur heard. Respiratory: Effort normal and breath sounds normal. No respiratory distress. She has no wheezes. She has no rales. She exhibits no tenderness.  GI: She exhibits no distension and no mass. There is tenderness (Right upper quadrant). There is no rebound and no guarding.  Musculoskeletal: She exhibits no edema or  tenderness.  Lymphadenopathy:    She has no cervical adenopathy.  Neurological: She is alert and oriented to person, place, and time. No cranial nerve deficit.  Skin: Skin is warm and dry. No rash noted. She is not diaphoretic. No erythema. There is pallor.  Psychiatric: She has a normal mood and affect. Her behavior is normal. Judgment and thought content normal.      Assessment/Plan: Symptomatic cholelithiasis/cholecystitis. Chest pain with normal cardiac catheterization 04/30/12 Hypertension History of migraines IBS with loose stools.   Plan: We will admit the patient, hydrate her, IV fluids IV antibiotics.  Laparoscopic cholecystectomy later today.   Delrico Minehart 05/02/2017, 9:36 AM

## 2017-05-02 NOTE — ED Notes (Signed)
Oxygen sat decreased to 80% after fentanyl. Pt placed on oxygen 3L via [place] with increase in oxygen sat to 96%.

## 2017-05-02 NOTE — ED Notes (Signed)
Patient ambulatory to bathroom without difficulties

## 2017-05-02 NOTE — ED Notes (Signed)
Oxygen saturation at 88% on RA.  Placed on 2L oxygen via [place]

## 2017-05-02 NOTE — Plan of Care (Signed)
°  Activity: Risk for activity intolerance will decrease 05/02/2017 2015 - Progressing by Dorene Sorrow, RN   Pain Managment: General experience of comfort will improve 05/02/2017 2015 - Progressing by Dorene Sorrow, RN

## 2017-05-03 DIAGNOSIS — J45909 Unspecified asthma, uncomplicated: Secondary | ICD-10-CM

## 2017-05-03 DIAGNOSIS — I951 Orthostatic hypotension: Secondary | ICD-10-CM | POA: Diagnosis not present

## 2017-05-03 DIAGNOSIS — I10 Essential (primary) hypertension: Secondary | ICD-10-CM | POA: Diagnosis not present

## 2017-05-03 LAB — COMPREHENSIVE METABOLIC PANEL
ALBUMIN: 2.8 g/dL — AB (ref 3.5–5.0)
ALK PHOS: 96 U/L (ref 38–126)
ALT: 103 U/L — ABNORMAL HIGH (ref 14–54)
AST: 78 U/L — AB (ref 15–41)
Anion gap: 5 (ref 5–15)
BILIRUBIN TOTAL: 0.5 mg/dL (ref 0.3–1.2)
BUN: 17 mg/dL (ref 6–20)
CALCIUM: 8.7 mg/dL — AB (ref 8.9–10.3)
CO2: 24 mmol/L (ref 22–32)
Chloride: 108 mmol/L (ref 101–111)
Creatinine, Ser: 0.46 mg/dL (ref 0.44–1.00)
GFR calc Af Amer: >60 mL/min (ref >60)
GFR calc non Af Amer: >60 mL/min (ref >60)
GLUCOSE: 134 mg/dL — AB (ref 65–99)
Potassium: 4.4 mmol/L (ref 3.5–5.1)
Sodium: 137 mmol/L (ref 135–145)
TOTAL PROTEIN: 5.6 g/dL — AB (ref 6.5–8.1)

## 2017-05-03 LAB — URINALYSIS, ROUTINE W REFLEX MICROSCOPIC
BILIRUBIN URINE: NEGATIVE
Glucose, UA: NEGATIVE mg/dL
Hgb urine dipstick: NEGATIVE
Ketones, ur: NEGATIVE mg/dL
Nitrite: NEGATIVE
PH: 5 (ref 5.0–8.0)
Protein, ur: NEGATIVE mg/dL
SPECIFIC GRAVITY, URINE: 1.024 (ref 1.005–1.030)

## 2017-05-03 LAB — CBC
HEMATOCRIT: 27.6 % — AB (ref 36.0–46.0)
HEMOGLOBIN: 9.3 g/dL — AB (ref 12.0–15.0)
MCH: 29.7 pg (ref 26.0–34.0)
MCHC: 33.7 g/dL (ref 30.0–36.0)
MCV: 88.2 fL (ref 78.0–100.0)
Platelets: 317 10*3/uL (ref 150–400)
RBC: 3.13 MIL/uL — ABNORMAL LOW (ref 3.87–5.11)
RDW: 13.5 % (ref 11.5–15.5)
WBC: 13.5 10*3/uL — AB (ref 4.0–10.5)

## 2017-05-03 LAB — LACTIC ACID, PLASMA
LACTIC ACID, VENOUS: 2.9 mmol/L — AB (ref 0.5–1.9)
LACTIC ACID, VENOUS: 2.5 mmol/L — AB (ref 0.5–1.9)
LACTIC ACID, VENOUS: 2.2 mmol/L — AB (ref 0.5–1.9)
Lactic Acid, Venous: 1.5 mmol/L (ref 0.5–1.9)

## 2017-05-03 MED ORDER — SODIUM CHLORIDE 0.9 % IV BOLUS (SEPSIS)
1000.0000 mL | Freq: Once | INTRAVENOUS | Status: AC
  Administered 2017-05-03: 1000 mL via INTRAVENOUS

## 2017-05-03 MED ORDER — SENNOSIDES-DOCUSATE SODIUM 8.6-50 MG PO TABS
2.0000 | ORAL_TABLET | Freq: Two times a day (BID) | ORAL | Status: DC
  Administered 2017-05-03 – 2017-05-04 (×3): 2 via ORAL
  Filled 2017-05-03 (×2): qty 2

## 2017-05-03 MED ORDER — ACETAMINOPHEN 325 MG PO TABS: 650.0000 mg | ORAL_TABLET | Freq: Four times a day (QID) | ORAL | Status: DC | PRN

## 2017-05-03 MED ORDER — POTASSIUM CHLORIDE IN NACL 20-0.9 MEQ/L-% IV SOLN
INTRAVENOUS | Status: DC
  Administered 2017-05-03: 15:00:00 via INTRAVENOUS
  Filled 2017-05-03 (×2): qty 1000

## 2017-05-03 NOTE — Progress Notes (Signed)
CRITICAL VALUE ALERT  Critical Value:  Lactic Acid 2.9  Date & Time Notied:  05/03/17  1433  Provider Notified: Dr Shanon Brow  Orders Received/Actions taken: Repeat Lactic Acid labs, Pt has received 2L NS.

## 2017-05-03 NOTE — Consult Note (Signed)
History and Physical    Traci Johnson VOJ:[id number] DOB: December 07, 1950 DOA: 05/02/2017  PCP: Celene Squibb, MD  Patient coming from:  home  Chief Complaint:  Passed out Consulting team :  General surgery  HPI: Traci Johnson is a 66 y.o. female with medical history significant of ashtma, HTN and recent cath last week who needed dopamine briefly for hypotension (pt reports this was caused by ntg she was given) was d/c on 11/27 with holding of her bp meds.  Cath was clean and normal.  Pt had persisted symptoms of chest pain/abdominal pain and presented back to the hospital and found that it was actually her gallbladder.  She is pod 1 from lap chole.  She is doing very well post op.  She has not been eating or drinking well due to her two hospitalizations in the last week.  She denies any fevers.  Last night she got up to go to the bathroom and became very dizzy and had passed out.  A rapid response was called.  This am orthostatic vitals have been checked and she is very orthostatic.  Pt reports no h/o orthostasis in the past.  She is not on oral bp meds, but hydralazine was ordered which has been stopped.  No n/v/d.  She is tolerating po but would like to eat or drink more "when the doctors let her".  Consulted on pt today due to her syncopal episode last night and orthostatic hypotension this am.  Review of Systems: As per HPI otherwise 10 point review of systems negative.   Past Medical History:  Diagnosis Date   Allergy    Asthma    Hypertension    Migraines    cluster    Past Surgical History:  Procedure Laterality Date   ABDOMINAL HYSTERECTOMY  1985   CHOLECYSTECTOMY N/A 05/02/2017   Procedure: LAPAROSCOPIC CHOLECYSTECTOMY;  Surgeon: Kinsinger, Arta Bruce, MD;  Location: [facility];  Service: General;  Laterality: N/A;   LEFT HEART CATH AND CORONARY ANGIOGRAPHY N/A 04/30/2017   Procedure: LEFT HEART CATH AND CORONARY ANGIOGRAPHY;  Surgeon: Lorretta Harp, MD;  Location: [facility];  Service: Cardiovascular;  Laterality: N/A;   LIGAMENT REPAIR Right    birth defect     reports that  has never smoked. she has never used smokeless tobacco. She reports that she drinks alcohol. She reports that she does not use drugs.  Allergies  Allergen Reactions   Codeine Nausea And Vomiting   Lisinopril Cough    Family History  Problem Relation Age of Onset   Dementia Mother    Cancer Father    CAD Neg Hx     Prior to Admission medications   Medication Sig Start Date End Date Taking? Authorizing Provider  albuterol (PROVENTIL HFA;VENTOLIN HFA) 108 (90 BASE) MCG/ACT inhaler Inhale 2 puffs into the lungs every 6 (six) hours as needed for wheezing.   Yes [provider]  aspirin 81 MG tablet Take 81 mg by mouth daily.   Yes [provider]  Cholecalciferol (VITAMIN D3) 5000 units CAPS Take 1 capsule by mouth daily.   Yes [provider]  cyclobenzaprine (FLEXERIL) 5 MG tablet TAKE ONE TABLET BY MOUTH THREE TIMES DAILY AS NEEDED FOR MUSCLE SPASM 09/24/14  Yes Hawks, Christy A, FNP  estradiol (ESTRACE) 1 MG tablet Take 1 mg by mouth daily.    Yes [provider]  Fluticasone-Salmeterol (ADVAIR DISKUS) 100-50 MCG/DOSE AEPB INHALE ONE DOSE BY MOUTH EVERY 12 HOURS  07/15/14  Yes Evelina Dun A, FNP  meloxicam (MOBIC) 15 MG tablet TAKE ONE TABLET BY MOUTH ONCE DAILY 09/24/14  Yes Evelina Dun A, FNP  Potassium 99 MG TABS Take 1 tablet by mouth daily.   Yes [provider]  SUMAtriptan (IMITREX) 6 MG/0.5ML SOLN injection Inject 6 mg into the skin every 2 (two) hours as needed for migraine or headache.    Yes [provider]  vitamin B-12 (CYANOCOBALAMIN) 1000 MCG tablet Take 1,000 mcg by mouth daily.   Yes [provider]  vitamin E (VITAMIN E) 400 UNIT capsule Take 400 Units by mouth daily.   Yes [provider]    Physical Exam: Vitals:   05/03/17 0216 05/03/17 0540 05/03/17 0728 05/03/17  0955  BP: 113/70 112/67    Pulse: 100 96  95  Resp: 16 18  16   Temp: 98.7 F (37.1 C) 98 F (36.7 C)  98.6 F (37 C)  TempSrc: Oral Oral  Oral  SpO2: 96% 96% 92%   Weight:      Height:          Constitutional: NAD, calm, comfortable Vitals:   05/03/17 0216 05/03/17 0540 05/03/17 0728 05/03/17 0955  BP: 113/70 112/67    Pulse: 100 96  95  Resp: 16 18  16   Temp: 98.7 F (37.1 C) 98 F (36.7 C)  98.6 F (37 C)  TempSrc: Oral Oral  Oral  SpO2: 96% 96% 92%   Weight:      Height:       Eyes: PERRL, lids and conjunctivae normal ENMT: Mucous membranes are moist. Posterior pharynx clear of any exudate or lesions.Normal dentition.  Neck: normal, supple, no masses, no thyromegaly Respiratory: clear to auscultation bilaterally, no wheezing, no crackles. Normal respiratory effort. No accessory muscle use.  Cardiovascular: Regular rate and rhythm, no murmurs / rubs / gallops. No extremity edema. 2+ pedal pulses. No carotid bruits.  Abdomen: no tenderness, no masses palpated. No hepatosplenomegaly. Bowel sounds positive.  Musculoskeletal: no clubbing / cyanosis. No joint deformity upper and lower extremities. Good ROM, no contractures. Normal muscle tone.  Skin: no rashes, lesions, ulcers. No induration Neurologic: CN 2-12 grossly intact. Sensation intact, DTR normal. Strength 5/5 in all 4.  Psychiatric: Normal judgment and insight. Alert and oriented x 3. Normal mood.    Labs on Admission: I have personally reviewed following labs and imaging studies  CBC: Recent Labs  Lab 04/28/17 1338 05/01/17 0237 05/02/17 0215 05/02/17 2242 05/03/17 0455  WBC 10.0 12.4* 17.5* 14.8* 13.5*  NEUTROABS  --   --  14.8*  --   --   HGB 12.6 12.9 12.5 10.6* 9.3*  HCT 38.8 37.8 37.5 32.5* 27.6*  MCV 88.8 85.7 86.4 88.6 88.2  PLT 312 318 349 305 378   Basic Metabolic Panel: Recent Labs  Lab 04/28/17 1338 04/29/17 0833 05/01/17 0237 05/02/17 0215 05/03/17 0455  NA 137 136 136 137 137   K 3.4* 3.9 4.0 3.8 4.4  CL 107 107 106 106 108  CO2 23 24 22 23 24   GLUCOSE 118* 158* 158* 144* 134*  BUN 10 13 15  25* 17  CREATININE 0.54 0.63 0.67 0.51 0.46  CALCIUM 9.4 9.5 9.9 10.0 8.7*   GFR: Estimated Creatinine Clearance: 71.5 mL/min (by C-G formula based on SCr of 0.46 mg/dL). Liver Function Tests: Recent Labs  Lab 04/28/17 1338 04/29/17 0415 05/02/17 0215 05/03/17 0455  AST 67* 35 40 78*  ALT 67* 49 32 103*  ALKPHOS 108 94 93 96  BILITOT 0.8 0.5 0.3 0.5  PROT 6.3* 5.6* 7.4 5.6*  ALBUMIN 3.2* 2.8* 3.7 2.8*   Recent Labs  Lab 04/28/17 1338 05/02/17 0215  LIPASE 35 30   No results for input(s): AMMONIA in the last 168 hours. Coagulation Profile: Recent Labs  Lab 04/30/17 0500  INR 0.91   Cardiac Enzymes: Recent Labs  Lab 04/27/17 2317 04/28/17 0527 04/28/17 1338  TROPONINI <0.03 <0.03 <0.03   BNP (last 3 results) No results for input(s): PROBNP in the last 8760 hours. HbA1C: No results for input(s): HGBA1C in the last 72 hours. CBG: Recent Labs  Lab 05/02/17 2113  GLUCAP 161*   Lipid Profile: No results for input(s): CHOL, HDL, LDLCALC, TRIG, CHOLHDL, LDLDIRECT in the last 72 hours. Thyroid Function Tests: No results for input(s): TSH, T4TOTAL, FREET4, T3FREE, THYROIDAB in the last 72 hours. Anemia Panel: No results for input(s): VITAMINB12, FOLATE, FERRITIN, TIBC, IRON, RETICCTPCT in the last 72 hours. Urine analysis:    Component Value Date/Time   COLORURINE STRAW (A) 05/02/2017 0639   APPEARANCEUR CLOUDY (A) 05/02/2017 0639   LABSPEC 1.010 05/02/2017 0639   PHURINE 7.5 05/02/2017 0639   GLUCOSEU NEGATIVE 05/02/2017 0639   HGBUR TRACE (A) 05/02/2017 0639   BILIRUBINUR NEGATIVE 05/02/2017 [place] 05/02/2017 0639   PROTEINUR NEGATIVE 05/02/2017 0639   NITRITE NEGATIVE 05/02/2017 0639   LEUKOCYTESUR NEGATIVE 05/02/2017 0639   Sepsis Labs:  !!!!!!!!!!!!!!!!!!!!!!!!!!!!!!!!!!!!!!!!!!!! @LABRCNTIP (procalcitonin:4,lacticidven:4) ) Recent Results (from the past 240 hour(s))  Surgical pcr screen     Status: None   Collection Time: 04/29/17  8:20 PM  Result Value Ref Range Status   MRSA, PCR NEGATIVE NEGATIVE Final   Staphylococcus aureus NEGATIVE NEGATIVE Final    Comment: (NOTE) The Xpert SA Assay (FDA approved for NASAL specimens in patients 8 years of age and older), is one component of a comprehensive surveillance program. It is not intended to diagnose infection nor to guide or monitor treatment.      Radiological Exams on Admission: Ct Abdomen Pelvis W Contrast  Result Date: 05/02/2017 CLINICAL DATA:  Episodic RIGHT upper quadrant pain for 6 days. History of cholelithiasis, hysterectomy. EXAM: CT ABDOMEN AND PELVIS WITH CONTRAST TECHNIQUE: Multidetector CT imaging of the abdomen and pelvis was performed using the standard protocol following bolus administration of intravenous contrast. CONTRAST:  164mL ISOVUE-300 IOPAMIDOL (ISOVUE-300) INJECTION 61% COMPARISON:  Abdominal ultrasound April 29, 2017 FINDINGS: LOWER CHEST: 3 mm LEFT lower lobe sub solid pleural based nodule, likely benign and no routine indicated follow-up in subcentimeter lymph node RIGHT costophrenic angle. Included heart size is normal. No pericardial effusion. HEPATOBILIARY: Slightly nodular RIGHT hepatic contour with focal fatty infiltration about the falciform ligament. Distended gallbladder. Known cholelithiasis are isodense by CT. No gallbladder wall thickening or pericholecystic fluid. PANCREAS: Normal. SPLEEN: Normal. ADRENALS/URINARY TRACT: Kidneys are orthotopic, demonstrating symmetric enhancement. No nephrolithiasis, hydronephrosis or solid renal masses. The unopacified ureters are normal in course and caliber. Delayed imaging through the kidneys demonstrates symmetric prompt contrast excretion within the proximal urinary collecting system. Urinary  bladder is decompressed and unremarkable. Normal adrenal glands. STOMACH/BOWEL: Small hiatal hernia. The stomach, small and large bowel are normal in course and caliber without inflammatory changes. Enteric contrast has not yet reached the distal small bowel. Small amount of small bowel feces compatible with chronic stasis. Moderate descending and sigmoid colonic diverticulosis. Mild amount of retained large bowel stool. Normal appendix. VASCULAR/LYMPHATIC: Aortoiliac vessels are normal in course and caliber. Mild  calcific atherosclerosis. No lymphadenopathy by CT size criteria. REPRODUCTIVE: Status post hysterectomy. OTHER: No intraperitoneal free fluid or free air. MUSCULOSKELETAL: Nonacute.  Mild degenerative change of the hips. IMPRESSION: 1. No acute intra-abdominal or pelvic process. 2. Early potential cirrhosis. Recommend correlation with liver function tests on nonemergent basis. Electronically Signed   By: Elon Alas M.D.   On: 05/02/2017 04:28   US Abdomen Limited Ruq  Result Date: 05/02/2017 CLINICAL DATA:  Acute right upper quadrant abdominal pain. EXAM: ULTRASOUND ABDOMEN LIMITED RIGHT UPPER QUADRANT COMPARISON:  CT scan of same day.  Ultrasound of April 29, 2017. FINDINGS: Gallbladder: Multiple gallstones and sludge are noted within gallbladder lumen. No gallbladder wall thickening or pericholecystic fluid is noted. Positive sonographic Murphy's sign is noted. Common bile duct: Diameter: 4.8 mm which is within normal limits. Liver: No focal lesion identified. Within normal limits in parenchymal echogenicity. Portal vein is patent on color Doppler imaging with normal direction of blood flow towards the liver. IMPRESSION: Cholelithiasis and sludge is noted within gallbladder lumen without gallbladder wall thickening or pericholecystic fluid. Positive sonographic Percell Miller sign is noted, and if there is clinical concern for cholecystitis, HIDA scan would be recommended for further evaluation.  Electronically Signed   By: Marijo Conception, M.D.   On: 05/02/2017 09:05    Assessment/Plan 66 yo female s/p LHC and lap chole within the last week with syncopal episode last night secondary to orthostatic hypotension  Principal Problem:   Orthostatic hypotension- likely due to volume depletion.  ekg last night around episode was nsr with normal rate and rhythm.  Give 2 liters ivf and repeat orthostatics until normalizes.  Agree with holding all bp meds including iv hydralazine.  Will follow along with you daily.  Push oral fluids.  Active Problems:   Syncope due to orthostatic hypotension- as above   Essential hypertension- as above   Asthma, chronic- stable   Acute cholecystitis- per surgery team, s/p lap chole     DVT prophylaxis:  scds Code Status:  full Family Communication:  husband Disposition Plan:  Per surgery Admission status:  Observation   Latresa Gasser A MD [facility]  If 7PM-7AM, please contact night-coverage [email or web address] Password [facility]  05/03/2017, 10:38 AM

## 2017-05-03 NOTE — Progress Notes (Signed)
CRITICAL VALUE ALERT  Critical Value:  Lactic Acid 2.2  Date & Time Notied:  05/03/2017 @ 1941  Provider Notified: MD On Call 1st Paged @ 2012                             MD On Call 2nd Paged @ [phone]  Orders Received/Actions taken: Received return call at 2105. No new orders received at this time.

## 2017-05-03 NOTE — Progress Notes (Signed)
CRITICAL VALUE ALERT  Critical Value:  Lactic Acid 2.9  Date & Time Notied:  05/03/17  1452  Provider Notified: Dr Kieth Brightly  Orders Received/Actions taken:

## 2017-05-03 NOTE — Plan of Care (Signed)
°  Activity: Risk for activity intolerance will decrease 05/03/2017 0901 - Progressing by Dorene Sorrow, RN 05/02/2017 2015 - Progressing by Dorene Sorrow, RN   Nutrition: Adequate nutrition will be maintained 05/03/2017 0901 - Progressing by Dorene Sorrow, RN 05/02/2017 2015 - Progressing by Dorene Sorrow, RN   Health Behavior/Discharge Planning: Ability to manage health-related needs will improve 05/03/2017 0901 - Progressing by Dorene Sorrow, RN 05/02/2017 2015 - Progressing by Dorene Sorrow, RN

## 2017-05-03 NOTE — Discharge Summary (Signed)
Physician Discharge Summary  Patient ID: Traci Johnson MRN: [id number] DOB/AGE: 02-04-1951 66 y.o.  Admit date: 05/02/2017 Discharge date: 05/04/2017  Admission Diagnoses:  Symptomatic cholelithiasis/cholecystitis. Chest pain with normal cardiac catheterization 04/30/12 Hypertension History of migraines IBS with loose stools.  Discharge Diagnoses:  Symptomatic cholelithiasis/cholecystitis. Chest pain with normal cardiac catheterization 04/30/12 Recurrent vasovagal syncope/orthostasis Hypertension History of migraines IBS with loose stools Anemia    Principal Problem:   Orthostatic hypotension Active Problems:   Essential hypertension   Asthma, chronic   Acute cholecystitis   Syncope due to orthostatic hypotension   PROCEDURES: Laparoscopic cholecystectomy, 05/02/17, Dr. Lurena Joiner [facility] Course:   Patient is a 66 year old female who presented to the ED on 04/27/2018 with complaints of chest painwithacute onset. She was seen and admitted by medicine. Troponins are negative on admission. EKG was unremarkable. She was seen in consultation by cardiology and a stress test was obtained. Stress test was abnormal patient was subsequently scheduled for cardiac catheterization 04/30/17. Cardiac catheterization showed normal coronaries with an EF of 55-60% with an ejection fraction of 55-65%. Postop she had some hypotension was actually placed on dopamine for a vasovagal response. Ultrasound during this admission showed multiple gallstones including a nonmobile 1.6 cm stone in the gallbladder neck with no wall thickening no sonographic Murphy's sign common bile duct was 5 mm. She did not have evidence of acute cholecystitis and was subsequently discharged homeyesterday.   She wantedaburger so they stopped at a cookout and got a hamburger on the way home.Her husband took her home and then later went out to the grocery store. When he came back she was bent over  having the same pain she had before this time it was more in the right upper quadrant. On admission 04/27/2017 patient noted patient was more midline substernal pain.  She presented to the emergency department at [facility] around 3 AM this morning.They had spent 4 hours at [facility] ED and had not been seen. At this time she was complaining of severe abdominal pain.  Workup shows she is afebrile mildly hypertensive. Labs shows a normal CMP, lipase of 30 LFTs are normal. WBC is 17.5, hemoglobin 12.5 hematocrit 37, urinalysis is normal. CT of the abdomen shows no acute intra-abdominal or pelvic process. Early potential cirrhosis. A repeat ultrasound shows multiple gallstones and sludge within the gallbladder lumen. No gallbladder wall Pericholecystic fluid is noted. There is a positive Murphy sign. Common bile duct remains normal at 4.8 mm. There are no focal lesions identified within the liver. Patient continues to have abdominal pain requiring multiple doses Of IV pain medication in the ED and we are asked to see her in consult. Patient subsequently be transferred to [facility] emergency department for evaluation and further treatment.  Hospital course;  Pt was seen in the ED and taken to the OR later that afternoon.  She did well with the surgery and returned to her room in good condition.  Around 10:30 PM she had an episode of syncope while getting up to bedside commode.  She awoke fairly quickly, but was described as pale and sluggish.  Rapid response was called and pt was recovering well at that point, nothing found.  She has done well and looks good the following AM.  We checked orthostatic BP and HR and she did infact become orthostatic with this exam.  Medicine has been ask to see.  This is the second episode of this, the last was 11/26 after  her cardiac cath.  Her BP improved with Hydration, and she was feeling pretty good.  Repeat labs the  following AM showed her H/H down And a repeat confirmed it was down, but not down trending.  It was Dr. Kasandra Knudsen and Dr. Amie Portland opinion she could go home. She is to follow up with her PCP and have her H/H rechecked next week.  CBC Latest Ref Rng & Units 05/04/2017 05/04/2017 05/03/2017  WBC 4.0 - 10.5 K/uL - 9.0 13.5(H)  Hemoglobin 12.0 - 15.0 g/dL 7.7(L) 7.2(L) 9.3(L)  Hematocrit 36.0 - 46.0 % 23.5(L) 21.9(L) 27.6(L)  Platelets 150 - 400 K/uL - 230 317   H/H 10.6/32.5 on admit   CMP Latest Ref Rng & Units 05/04/2017 05/03/2017 05/02/2017  Glucose 65 - 99 mg/dL 103(H) 134(H) 144(H)  BUN 6 - 20 mg/dL 12 17 25(H)  Creatinine 0.44 - 1.00 mg/dL 0.45 0.46 0.51  Sodium 135 - 145 mmol/L 140 137 137  Potassium 3.5 - 5.1 mmol/L 4.2 4.4 3.8  Chloride 101 - 111 mmol/L 113(H) 108 106  CO2 22 - 32 mmol/L 23 24 23   Calcium 8.9 - 10.3 mg/dL 8.3(L) 8.7(L) 10.0  Total Protein 6.5 - 8.1 g/dL - 5.6(L) 7.4  Total Bilirubin 0.3 - 1.2 mg/dL - 0.5 0.3  Alkaline Phos 38 - 126 U/L - 96 93  AST 15 - 41 U/L - 78(H) 40  ALT 14 - 54 U/L - 103(H) 32         Disposition: 01-Home or Self Care   Allergies as of 05/04/2017      Reactions   Codeine Nausea And Vomiting   Lisinopril Cough      Medication List    TAKE these medications   albuterol 108 (90 Base) MCG/ACT inhaler Commonly known as:  PROVENTIL HFA;VENTOLIN HFA Inhale 2 puffs into the lungs every 6 (six) hours as needed for wheezing.   aspirin 81 MG tablet Take 81 mg by mouth daily.   cyclobenzaprine 5 MG tablet Commonly known as:  FLEXERIL TAKE ONE TABLET BY MOUTH THREE TIMES DAILY AS NEEDED FOR MUSCLE SPASM   estradiol 1 MG tablet Commonly known as:  ESTRACE Take 1 mg by mouth daily.   Fluticasone-Salmeterol 100-50 MCG/DOSE Aepb Commonly known as:  ADVAIR DISKUS INHALE ONE DOSE BY MOUTH EVERY 12 HOURS   meloxicam 15 MG tablet Commonly known as:  MOBIC TAKE ONE TABLET BY MOUTH ONCE DAILY   Potassium 99 MG Tabs Take 1 tablet  by mouth daily.   SUMAtriptan 6 MG/0.5ML Soln injection Commonly known as:  IMITREX Inject 6 mg into the skin every 2 (two) hours as needed for migraine or headache.   traMADol 50 MG tablet Commonly known as:  ULTRAM Take 1 tablet (50 mg total) by mouth every 6 (six) hours as needed (mild pain if unrelieved by Tylenol).   vitamin B-12 1000 MCG tablet Commonly known as:  CYANOCOBALAMIN Take 1,000 mcg by mouth daily.   Vitamin D3 5000 units Caps Take 1 capsule by mouth daily.   vitamin E 400 UNIT capsule Generic drug:  vitamin E Take 400 Units by mouth daily.      Follow-up Information    [facility] [place] Follow up on 05/17/2017.   Specialty:  General Surgery Why:  Your appointment is at 3:45 PM.  Be at the office 30 minutes early for check in.  Bring your photo ID and your insurance information.   Contact information: [address] [place]  [place], John Z, MD Follow up in 1 week(s).   Specialty:  Internal Medicine Why:  hospital discharge follow up, repeat basic labs including cbc/bmp. Contact information: [place] [address] [phone]        [facility]. Go on 05/11/2017.   Why:  Go here to have your blood levels checked. You do not need to make an appointment. Call [facility] if you have any questions.  Contact information: [address] [place] [address] [place], [place] [address] [phone]          Signed: Earnstine Regal 05/07/2017, 2:13 PM

## 2017-05-03 NOTE — Progress Notes (Signed)
MD paged with follow up critical lab value for lactic acid 2.5. Will continue to monitor and await orders

## 2017-05-03 NOTE — Progress Notes (Signed)
1 Day Post-Op    CC: Abdominal pain   Subjective: Patient looks good this a.m.  Up in the chair, sore but overall feels much better.  She tolerated breakfast well.  She said she was hungry as the first time she is eaten.  Port sites all look good.  She had a vasovagal reaction again last evening.  Objective: Vital signs in last 24 hours: Temp:  [97.9 F (36.6 C)-98.7 F (37.1 C)] 98 F (36.7 C) (11/29 0540) Pulse Rate:  [72-100] 96 (11/29 0540) Resp:  [12-18] 18 (11/29 0540) BP: (105-166)/(62-95) 112/67 (11/29 0540) SpO2:  [92 %-99 %] 92 % (11/29 0728) Weight:  [83 kg (183 lb)] 83 kg (183 lb) (11/28 1055) Last BM Date: 04/29/17 120 PO listed this AM 3400 IV 250 urine recorded Afebrile, VSS Rapid response 9:55 PM Labs OK  Intake/Output from previous day: 11/28 0701 - 11/29 0700 In: 3398.8 [I.V.:2348.8; IV Piggyback:1050] Out: 300 [Urine:250; Blood:50] Intake/Output this shift: Total I/O In: 270 [P.O.:120; I.V.:150] Out: -   General appearance: alert, cooperative and no distress Resp: clear to auscultation bilaterally GI: Soft, sore, port sites all look good.  Tolerating a regular diet. 103/58   BP- Lying                     Pulse- Lying                98   Pulse- Lying    BP- Sitting                90/63   BP- Sitting    Pulse- Sitting                102   Pulse- Sitting    BP- Standing at 0 minutes                78/43   BP- Standing at 0 minutes    Pulse- Standing at 0 minutes                115   Pulse- Standing at 0 minutes    BP- Standing at 3 minutes                80/56   BP- Standing at 3 minutes    Pulse- Standing at 3 minutes                112   Pulse- Standing      Lab Results:  Recent Labs    05/02/17 2242 05/03/17 0455  WBC 14.8* 13.5*  HGB 10.6* 9.3*  HCT 32.5* 27.6*  PLT 305 317    BMET Recent Labs    05/02/17 0215 05/03/17 0455  NA 137 137  K 3.8 4.4  CL 106 108  CO2 23 24  GLUCOSE 144* 134*  BUN 25* 17  CREATININE 0.51 0.46   CALCIUM 10.0 8.7*   PT/INR No results for input(s): LABPROT, INR in the last 72 hours.  Recent Labs  Lab 04/28/17 1338 04/29/17 0415 05/02/17 0215 05/03/17 0455  AST 67* 35 40 78*  ALT 67* 49 32 103*  ALKPHOS 108 94 93 96  BILITOT 0.8 0.5 0.3 0.5  PROT 6.3* 5.6* 7.4 5.6*  ALBUMIN 3.2* 2.8* 3.7 2.8*     Lipase     Component Value Date/Time   LIPASE 30 05/02/2017 0215     Medications:  enoxaparin (LOVENOX) injection  40 mg Subcutaneous Q24H   meloxicam  15 mg  Oral Daily   mometasone-formoterol  2 puff Inhalation BID    sodium chloride 75 mL/hr at 05/02/17 1841   lactated ringers 125 mL/hr (05/02/17 1302)   Anti-infectives (From admission, onward)   Start     Dose/Rate Route Frequency Ordered Stop   05/03/17 1000  cefTRIAXone (ROCEPHIN) 2 g in dextrose 5 % 50 mL IVPB  Status:  Discontinued     2 g 100 mL/hr over 30 Minutes Intravenous Every 24 hours 05/02/17 1211 05/02/17 1832   05/02/17 0945  cefTRIAXone (ROCEPHIN) 2 g in dextrose 5 % 50 mL IVPB     2 g 100 mL/hr over 30 Minutes Intravenous  Once 05/02/17 0938 05/02/17 1049      Assessment/Plan Symptomatic cholelithiasis/cholecystitis. Laparoscopic cholecystectomy, 05/02/17 Dr. Lurena Joiner [facility]  Chest pain - admit 04/27/17: with normal cardiac catheterization 04/30/12 Vasovagal reaction - post cardiac catheterization 04/30/17 - discharged home 05/01/17   Orthostatic Hypotension post procedure  Hypertension History of migraines IBS with loose stools.  FEN: IV fluids/regular diet ID: Rocephin preop DVT: Lovenox Foley: Out Follow-up: [facility]  Plan: I have asked him to do orthostatics on her.  We will get her up and get around this a.m. with her IV out.  If she does well we will let her go home after lunch. She does become  orthostatic standing, I will ask Medicine to review before discharging.   LOS: 0 days    Rayaan Lorah 05/03/2017 [phone]

## 2017-05-03 NOTE — Progress Notes (Signed)
Critical lab value of 2.9 lactic acid received. Directly reported to patient's day nurse. Will continue to monitor.

## 2017-05-04 DIAGNOSIS — I951 Orthostatic hypotension: Secondary | ICD-10-CM | POA: Diagnosis not present

## 2017-05-04 LAB — CBC
HCT: 21.9 % — ABNORMAL LOW (ref 36.0–46.0)
Hemoglobin: 7.2 g/dL — ABNORMAL LOW (ref 12.0–15.0)
MCH: 29.4 pg (ref 26.0–34.0)
MCHC: 32.9 g/dL (ref 30.0–36.0)
MCV: 89.4 fL (ref 78.0–100.0)
PLATELETS: 230 10*3/uL (ref 150–400)
RBC: 2.45 MIL/uL — ABNORMAL LOW (ref 3.87–5.11)
RDW: 13.6 % (ref 11.5–15.5)
WBC: 9 10*3/uL (ref 4.0–10.5)

## 2017-05-04 LAB — BASIC METABOLIC PANEL
Anion gap: 4 — ABNORMAL LOW (ref 5–15)
BUN: 12 mg/dL (ref 6–20)
CALCIUM: 8.3 mg/dL — AB (ref 8.9–10.3)
CO2: 23 mmol/L (ref 22–32)
Chloride: 113 mmol/L — ABNORMAL HIGH (ref 101–111)
Creatinine, Ser: 0.45 mg/dL (ref 0.44–1.00)
GFR calc Af Amer: >60 mL/min (ref >60)
GLUCOSE: 103 mg/dL — AB (ref 65–99)
Potassium: 4.2 mmol/L (ref 3.5–5.1)
SODIUM: 140 mmol/L (ref 135–145)

## 2017-05-04 LAB — LACTIC ACID, PLASMA: Lactic Acid, Venous: 1.4 mmol/L (ref 0.5–1.9)

## 2017-05-04 LAB — HEMOGLOBIN AND HEMATOCRIT, BLOOD
HCT: 23.5 % — ABNORMAL LOW (ref 36.0–46.0)
Hemoglobin: 7.7 g/dL — ABNORMAL LOW (ref 12.0–15.0)

## 2017-05-04 LAB — TYPE AND SCREEN
ABO/RH(D): A POS
Antibody Screen: NEGATIVE

## 2017-05-04 MED ORDER — HYDROCODONE-ACETAMINOPHEN 5-325 MG PO TABS: 1.0000 | ORAL_TABLET | ORAL | Status: DC | PRN

## 2017-05-04 MED ORDER — TRAMADOL HCL 50 MG PO TABS: 50.0000 mg | ORAL_TABLET | Freq: Four times a day (QID) | ORAL | Status: DC | PRN

## 2017-05-04 MED ORDER — TRAMADOL HCL 50 MG PO TABS: 50.0000 mg | ORAL_TABLET | Freq: Four times a day (QID) | ORAL | 0 refills | Status: DC | PRN

## 2017-05-04 NOTE — Progress Notes (Signed)
Consultation PROGRESS NOTE  Taressa Rauh ZOX:[id number] DOB: [birth year] DOA: 05/02/2017 PCP: Celene Squibb, MD  HPI/Recap of past 24 hours:  POD#2, s/p lap chole Reports feeling better, no dizziness, no chest pain, no sob  Assessment/Plan: Principal Problem:   Orthostatic hypotension Active Problems:   Essential hypertension   Asthma, chronic   Acute cholecystitis   Syncope due to orthostatic hypotension  Orthostatic hypotension, likely from dehydration.  He has improved with hydration.  No sign of infection.  She had recent cardiac workup which is negative.  CBC did show drop of  hemoglobin.  General surgery is to decide whether to transfuse. She is to follow with her primary care physician and general surgery to repeat basic labs.  Hospitalist will sign off. Case discussed with general surgery.  Code Status: full  Family Communication: patient and husband  Disposition Plan: per general surgery   Consultants:  General surgery, primary  hospitalist as consultant for orthostatic hypotension  Procedures:  Status post lap chole November 28  Antibiotics:  Rocephin on November 28   Objective: BP (!) 114/50 (BP Location: Left Arm)    Pulse (!) 101    Temp 98.7 F (37.1 C) (Axillary)    Resp 16    Ht 5\' 3"  (1.6 m)    Wt 83 kg (183 lb)    SpO2 97%    BMI 32.42 kg/m   Intake/Output Summary (Last 24 hours) at 05/04/2017 1358 Last data filed at 05/04/2017 1254 Gross per 24 hour  Intake 3289.17 ml  Output 1550 ml  Net 1739.17 ml   Filed Weights   05/02/17 0201 05/02/17 1055  Weight: 83 kg (183 lb) 83 kg (183 lb)    Exam: Patient is examined daily including today on 05/04/2017, exams remain the same as of yesterday except that has changed    General:  NAD  Cardiovascular: RRR  Respiratory: CTABL  Abdomen: Soft/ND/NT, positive BS, post op changes  Musculoskeletal: No Edema  Neuro: alert, oriented   Data Reviewed: Basic Metabolic Panel: Recent Labs   Lab 04/29/17 0833 05/01/17 0237 05/02/17 0215 05/03/17 0455 05/04/17 0510  NA 136 136 137 137 140  K 3.9 4.0 3.8 4.4 4.2  CL 107 106 106 108 113*  CO2 24 22 23 24 23   GLUCOSE 158* 158* 144* 134* 103*  BUN 13 15 25* 17 12  CREATININE 0.63 0.67 0.51 0.46 0.45  CALCIUM 9.5 9.9 10.0 8.7* 8.3*   Liver Function Tests: Recent Labs  Lab 04/28/17 1338 04/29/17 0415 05/02/17 0215 05/03/17 0455  AST 67* 35 40 78*  ALT 67* 49 32 103*  ALKPHOS 108 94 93 96  BILITOT 0.8 0.5 0.3 0.5  PROT 6.3* 5.6* 7.4 5.6*  ALBUMIN 3.2* 2.8* 3.7 2.8*   Recent Labs  Lab 04/28/17 1338 05/02/17 0215  LIPASE 35 30   No results for input(s): AMMONIA in the last 168 hours. CBC: Recent Labs  Lab 05/01/17 0237 05/02/17 0215 05/02/17 2242 05/03/17 0455 05/04/17 0510 05/04/17 1221  WBC 12.4* 17.5* 14.8* 13.5* 9.0  --   NEUTROABS  --  14.8*  --   --   --   --   HGB 12.9 12.5 10.6* 9.3* 7.2* 7.7*  HCT 37.8 37.5 32.5* 27.6* 21.9* 23.5*  MCV 85.7 86.4 88.6 88.2 89.4  --   PLT 318 349 305 317 230  --    Cardiac Enzymes:   Recent Labs  Lab 04/27/17 2317 04/28/17 0527 04/28/17 1338  TROPONINI <0.03 <0.03 <  0.03   BNP (last 3 results) No results for input(s): BNP in the last 8760 hours.  ProBNP (last 3 results) No results for input(s): PROBNP in the last 8760 hours.  CBG: Recent Labs  Lab 05/02/17 2113  GLUCAP 161*    Recent Results (from the past 240 hour(s))  Surgical pcr screen     Status: None   Collection Time: 04/29/17  8:20 PM  Result Value Ref Range Status   MRSA, PCR NEGATIVE NEGATIVE Final   Staphylococcus aureus NEGATIVE NEGATIVE Final    Comment: (NOTE) The Xpert SA Assay (FDA approved for NASAL specimens in patients 65 years of age and older), is one component of a comprehensive surveillance program. It is not intended to diagnose infection nor to guide or monitor treatment.      Studies: No results found.  Scheduled Meds:  meloxicam  15 mg Oral Daily    mometasone-formoterol  2 puff Inhalation BID   senna-docusate  2 tablet Oral BID    Continuous Infusions:  0.9 % NaCl with KCl 20 mEq / L 10 mL/hr at 05/04/17 1254     Time spent: 65mins I have personally reviewed and interpreted on  05/04/2017 daily labs.  I reviewed all nursing notes,  vitals, pertinent old records  I have discussed plan of care as described above with RN , patient and family on 05/04/2017   Florencia Reasons MD, PhD  [facility] Pager [phone]. If 7PM-7AM, please contact night-coverage at [email or web address], password [facility] 05/04/2017, 1:58 PM  LOS: 0 days

## 2017-05-04 NOTE — Discharge Instructions (Signed)
GO TO [facility] IN ONE WEEK TO HAVE YOUR BLOOD LEVELS CHECKED (ie. Hemoglobin and hematocrit) - see follow up provider section for more information  Laparoscopic Cholecystectomy, Care After This sheet gives you information about how to care for yourself after your procedure. Your health care provider may also give you more specific instructions. If you have problems or questions, contact your health care provider. What can I expect after the procedure? After the procedure, it is common to have:  Pain at your incision sites. You will be given medicines to control this pain.  Mild nausea or vomiting.  Bloating and possible shoulder pain from the air-like gas that was used during the procedure.  Follow these instructions at home: Incision care   Follow instructions from your health care provider about how to take care of your incisions. Make sure you: ? Wash your hands with soap and water before you change your bandage (dressing). If soap and water are not available, use hand sanitizer. ? Change your dressing as told by your health care provider. ? Leave stitches (sutures), skin glue, or adhesive strips in place. These skin closures may need to be in place for 2 weeks or longer. If adhesive strip edges start to loosen and curl up, you may trim the loose edges. Do not remove adhesive strips completely unless your health care provider tells you to do that.  Do not take baths, swim, or use a hot tub until your health care provider approves. Ask your health care provider if you can take showers. You may only be allowed to take sponge baths for bathing.  Check your incision area every day for signs of infection. Check for: ? More redness, swelling, or pain. ? More fluid or blood. ? Warmth. ? Pus or a bad smell. Activity  Do not drive or use heavy machinery while taking prescription pain medicine.  Do not lift anything that is heavier than 10 lb (4.5 kg) until your health care provider  approves.  Do not play contact sports until your health care provider approves.  Do not drive for 24 hours if you were given a medicine to help you relax (sedative).  Rest as needed. Do not return to work or school until your health care provider approves. General instructions  Take over-the-counter and prescription medicines only as told by your health care provider.  To prevent or treat constipation while you are taking prescription pain medicine, your health care provider may recommend that you: ? Drink enough fluid to keep your urine clear or pale yellow. ? Take over-the-counter or prescription medicines. ? Eat foods that are high in fiber, such as fresh fruits and vegetables, whole grains, and beans. ? Limit foods that are high in fat and processed sugars, such as fried and sweet foods. Contact a health care provider if:  You develop a rash.  You have more redness, swelling, or pain around your incisions.  You have more fluid or blood coming from your incisions.  Your incisions feel warm to the touch.  You have pus or a bad smell coming from your incisions.  You have a fever.  One or more of your incisions breaks open. Get help right away if:  You have trouble breathing.  You have chest pain.  You have increasing pain in your shoulders.  You faint or feel dizzy when you stand.  You have severe pain in your abdomen.  You have nausea or vomiting that lasts for more than one day.  You  have leg pain. This information is not intended to replace advice given to you by your health care provider. Make sure you discuss any questions you have with your health care provider. Document Released: 05/22/2005 Document Revised: 12/11/2015 Document Reviewed: 11/08/2015 Elsevier Interactive Patient Education  2017 [place] ______[employer] LAPAROSCOPIC SURGERY: POST OP INSTRUCTIONS Always review your discharge instruction sheet given to you by  the facility where your surgery was performed. IF YOU HAVE DISABILITY OR FAMILY LEAVE FORMS, YOU MUST BRING THEM TO THE OFFICE FOR PROCESSING.   DO NOT GIVE THEM TO YOUR DOCTOR.  1. A prescription for pain medication may be given to you upon discharge.  Take your pain medication as prescribed, if needed.  If narcotic pain medicine is not needed, then you may take acetaminophen (Tylenol) or ibuprofen (Advil) as needed. 2. Take your usually prescribed medications unless otherwise directed. 3. If you need a refill on your pain medication, please contact your pharmacy.  They will contact our office to request authorization. Prescriptions will not be filled after 5pm or on week-ends. 4. You should follow a light diet the first few days after arrival home, such as soup and crackers, etc.  Be sure to include lots of fluids daily. 5. Most patients will experience some swelling and bruising in the area of the incisions.  Ice packs will help.  Swelling and bruising can take several days to resolve.  6. It is common to experience some constipation if taking pain medication after surgery.  Increasing fluid intake and taking a stool softener (such as Colace) will usually help or prevent this problem from occurring.  A mild laxative (Milk of Magnesia or Miralax) should be taken according to package instructions if there are no bowel movements after 48 hours. 7. Unless discharge instructions indicate otherwise, you may remove your bandages 24-48 hours after surgery, and you may shower at that time.  You may have steri-strips (small skin tapes) in place directly over the incision.  These strips should be left on the skin for 7-10 days.  If your surgeon used skin glue on the incision, you may shower in 24 hours.  The glue will flake off over the next 2-3 weeks.  Any sutures or staples will be removed at the office during your follow-up visit. 8. ACTIVITIES:  You may resume regular (light) daily activities beginning the  next day--such as daily self-care, walking, climbing stairs--gradually increasing activities as tolerated.  You may have sexual intercourse when it is comfortable.  Refrain from any heavy lifting or straining until approved by your doctor. a. You may drive when you are no longer taking prescription pain medication, you can comfortably wear a seatbelt, and you can safely maneuver your car and apply brakes. b. RETURN TO WORK:  __________________________________________________________ 9. You should see your doctor in the office for a follow-up appointment approximately 2-3 weeks after your surgery.  Make sure that you call for this appointment within a day or two after you arrive home to insure a convenient appointment time. 10. OTHER INSTRUCTIONS: __________________________________________________________________________________________________________________________ __________________________________________________________________________________________________________________________ WHEN TO CALL YOUR DOCTOR: 1. Fever over 101.0 2. Inability to urinate 3. Continued bleeding from incision. 4. Increased pain, redness, or drainage from the incision. 5. Increasing abdominal pain  The clinic staff is available to answer your questions during regular business hours.  Please dont hesitate to call and ask to speak to one of the nurses for clinical concerns.  If you have a medical emergency,  go to the nearest emergency room or call 911.  A surgeon from [facility] is always on call at the hospital. [address], [place], [place], [place]  [address] ? [address], [place], [place]   [address] [phone] ? [phone] ? FAX [phone] Web site: [email or web address]

## 2017-05-04 NOTE — Plan of Care (Signed)
°  Clinical Measurements: Ability to maintain clinical measurements within normal limits will improve 05/04/2017 1432 - Progressing by Dorene Sorrow, RN   Activity: Risk for activity intolerance will decrease 05/04/2017 1432 - Progressing by Dorene Sorrow, RN   Nutrition: Adequate nutrition will be maintained 05/04/2017 1432 - Progressing by Dorene Sorrow, RN

## 2017-05-04 NOTE — Progress Notes (Signed)
Patient's Hg improved. Pt will be discharged per instructions from Dr. Kieth Brightly. Pt will f/u with PCP to have Hg and HCT checked within one week.  [place] Latino, [facility] Pager [phone]

## 2017-05-04 NOTE — Progress Notes (Signed)
2 Days Post-Op    CC: Abdominal pain  Subjective: Patient sitting up up in the chair and looks good this morning she says she feels good this morning.  She was unaware of the hemoglobin drop.  Her port sites all look good and she is tolerating a diet well.  She did have orthostatics done at 5 AM but unfortunately they do not seem to been recorded.  Going to repeat them now.  Objective: Vital signs in last 24 hours: Temp:  [98.1 F (36.7 C)-98.7 F (37.1 C)] 98.7 F (37.1 C) (11/30 0540) Pulse Rate:  [92-111] 101 (11/30 0550) Resp:  [16-17] 16 (11/30 0550) BP: (115-131)/(57-66) 115/57 (11/30 0550) SpO2:  [90 %-96 %] 95 % (11/30 0758) Last BM Date: 05/03/17 750 PO 4000 IV fluid 1300 urine \\stool  x 1 Repeat orthostatics better last PM, still tachycardic - not done this AM ABG last PM OK  Lactate has been elevated, BMP OK H/H is down this AM UA nitrate negative    Intake/Output from previous day: 11/29 0701 - 11/30 0700 In: 4799.6 [P.O.:750; I.V.:1049.6; IV Piggyback:3000] Out: 1300 [Urine:1300] Intake/Output this shift: No intake/output data recorded.  General appearance: alert, cooperative and no distress Resp: clear to auscultation bilaterally GI: Soft, sore, port sites all look fine.  She is tolerating a diet and had a bowel movement last evening.  Lab Results:  Recent Labs    05/03/17 0455 05/04/17 0510  WBC 13.5* 9.0  HGB 9.3* 7.2*  HCT 27.6* 21.9*  PLT 317 230    BMET Recent Labs    05/03/17 0455 05/04/17 0510  NA 137 140  K 4.4 4.2  CL 108 113*  CO2 24 23  GLUCOSE 134* 103*  BUN 17 12  CREATININE 0.46 0.45  CALCIUM 8.7* 8.3*   PT/INR No results for input(s): LABPROT, INR in the last 72 hours.  Recent Labs  Lab 04/28/17 1338 04/29/17 0415 05/02/17 0215 05/03/17 0455  AST 67* 35 40 78*  ALT 67* 49 32 103*  ALKPHOS 108 94 93 96  BILITOT 0.8 0.5 0.3 0.5  PROT 6.3* 5.6* 7.4 5.6*  ALBUMIN 3.2* 2.8* 3.7 2.8*     Lipase     Component  Value Date/Time   LIPASE 30 05/02/2017 0215     Medications:  meloxicam  15 mg Oral Daily   mometasone-formoterol  2 puff Inhalation BID   senna-docusate  2 tablet Oral BID    Assessment/Plan Symptomatic cholelithiasis/cholecystitis. Laparoscopic cholecystectomy, 05/02/17 Dr. Lurena Joiner Kinsinger  Chest pain - admit 04/27/17: with normal cardiac catheterization 04/30/12 Vasovagal reaction - post cardiac catheterization 04/30/17 - discharged home 05/01/17   Orthostatic Hypotension post procedure Anemia with hydration post op - H/H 7.2/21.9 - recheck at noon   Hypertension History of migraines  IBS with loose stools.  FEN: IV fluids/regular diet ID: Rocephin preop DVT: Lovenox held this AM with drop in H/H Foley: Out Follow-up: [facility]  Plan: She is ready to go home from a surgical standpoint.  Will await recommendations from medicine, we are repeating her hemoglobin hematocrit at noon.  We have held her Lovenox for now.  Appreciate assistance of Medicine service.        LOS: 0 days    Laquan Ludden 05/04/2017 [id number]

## 2017-05-04 NOTE — Progress Notes (Signed)
05/04/17  1515  Reviewed discharge instructions with patient. Patient verbalized understanding of discharge instructions. Copy of discharge instruction and prescription given to patient. Patient is aware of Labs due on 12/7.

## 2017-05-05 LAB — ABO/RH: ABO/RH(D): A POS

## 2017-05-07 DIAGNOSIS — R6883 Chills (without fever): Secondary | ICD-10-CM | POA: Diagnosis not present

## 2017-05-07 DIAGNOSIS — Z48815 Encounter for surgical aftercare following surgery on the digestive system: Secondary | ICD-10-CM | POA: Diagnosis not present

## 2017-05-07 DIAGNOSIS — G8918 Other acute postprocedural pain: Secondary | ICD-10-CM | POA: Diagnosis not present

## 2017-05-09 ENCOUNTER — Ambulatory Visit (HOSPITAL_COMMUNITY)
Admission: RE | Admit: 2017-05-09 | Discharge: 2017-05-09 | Disposition: A | Discharge status: Home / Self Care | Payer: BLUE CROSS/BLUE SHIELD | Source: Ambulatory Visit | Attending: Internal Medicine | Admitting: Internal Medicine

## 2017-05-09 ENCOUNTER — Encounter (HOSPITAL_COMMUNITY): Payer: Self-pay

## 2017-05-09 DIAGNOSIS — Z1231 Encounter for screening mammogram for malignant neoplasm of breast: Secondary | ICD-10-CM | POA: Insufficient documentation

## 2017-05-15 DIAGNOSIS — Z9049 Acquired absence of other specified parts of digestive tract: Secondary | ICD-10-CM | POA: Diagnosis not present

## 2017-06-15 DIAGNOSIS — E782 Mixed hyperlipidemia: Secondary | ICD-10-CM | POA: Diagnosis not present

## 2017-06-15 DIAGNOSIS — R7301 Impaired fasting glucose: Secondary | ICD-10-CM | POA: Diagnosis not present

## 2017-06-15 DIAGNOSIS — E663 Overweight: Secondary | ICD-10-CM | POA: Diagnosis not present

## 2017-06-20 DIAGNOSIS — Z0001 Encounter for general adult medical examination with abnormal findings: Secondary | ICD-10-CM | POA: Diagnosis not present

## 2017-08-13 DIAGNOSIS — K130 Diseases of lips: Secondary | ICD-10-CM | POA: Diagnosis not present

## 2017-08-13 DIAGNOSIS — D225 Melanocytic nevi of trunk: Secondary | ICD-10-CM | POA: Diagnosis not present

## 2017-08-13 DIAGNOSIS — L708 Other acne: Secondary | ICD-10-CM | POA: Diagnosis not present

## 2017-08-13 DIAGNOSIS — D485 Neoplasm of uncertain behavior of skin: Secondary | ICD-10-CM | POA: Diagnosis not present

## 2017-08-13 DIAGNOSIS — B078 Other viral warts: Secondary | ICD-10-CM | POA: Diagnosis not present

## 2017-11-18 DIAGNOSIS — M5440 Lumbago with sciatica, unspecified side: Secondary | ICD-10-CM | POA: Diagnosis not present

## 2017-12-16 DIAGNOSIS — I10 Essential (primary) hypertension: Secondary | ICD-10-CM | POA: Diagnosis not present

## 2017-12-16 DIAGNOSIS — Z0001 Encounter for general adult medical examination with abnormal findings: Secondary | ICD-10-CM | POA: Diagnosis not present

## 2017-12-16 DIAGNOSIS — R7301 Impaired fasting glucose: Secondary | ICD-10-CM | POA: Diagnosis not present

## 2017-12-16 DIAGNOSIS — E782 Mixed hyperlipidemia: Secondary | ICD-10-CM | POA: Diagnosis not present

## 2017-12-16 DIAGNOSIS — J449 Chronic obstructive pulmonary disease, unspecified: Secondary | ICD-10-CM | POA: Diagnosis not present

## 2017-12-25 DIAGNOSIS — J302 Other seasonal allergic rhinitis: Secondary | ICD-10-CM | POA: Diagnosis not present

## 2017-12-25 DIAGNOSIS — I10 Essential (primary) hypertension: Secondary | ICD-10-CM | POA: Diagnosis not present

## 2017-12-25 DIAGNOSIS — R7301 Impaired fasting glucose: Secondary | ICD-10-CM | POA: Diagnosis not present

## 2017-12-25 DIAGNOSIS — E782 Mixed hyperlipidemia: Secondary | ICD-10-CM | POA: Diagnosis not present

## 2017-12-29 DIAGNOSIS — M25561 Pain in right knee: Secondary | ICD-10-CM | POA: Diagnosis not present

## 2017-12-29 DIAGNOSIS — M1711 Unilateral primary osteoarthritis, right knee: Secondary | ICD-10-CM | POA: Diagnosis not present

## 2017-12-31 ENCOUNTER — Other Ambulatory Visit: Payer: Self-pay | Admitting: Internal Medicine

## 2017-12-31 DIAGNOSIS — Z780 Asymptomatic menopausal state: Secondary | ICD-10-CM

## 2018-01-02 DIAGNOSIS — M1711 Unilateral primary osteoarthritis, right knee: Secondary | ICD-10-CM | POA: Diagnosis not present

## 2018-01-02 DIAGNOSIS — S83241A Other tear of medial meniscus, current injury, right knee, initial encounter: Secondary | ICD-10-CM | POA: Diagnosis not present

## 2018-01-09 DIAGNOSIS — R600 Localized edema: Secondary | ICD-10-CM | POA: Diagnosis not present

## 2018-01-09 DIAGNOSIS — M25461 Effusion, right knee: Secondary | ICD-10-CM | POA: Diagnosis not present

## 2018-01-09 DIAGNOSIS — M7651 Patellar tendinitis, right knee: Secondary | ICD-10-CM | POA: Diagnosis not present

## 2018-01-09 DIAGNOSIS — S83231A Complex tear of medial meniscus, current injury, right knee, initial encounter: Secondary | ICD-10-CM | POA: Diagnosis not present

## 2018-01-16 ENCOUNTER — Encounter: Payer: BLUE CROSS/BLUE SHIELD | Admitting: Adult Health

## 2018-01-16 DIAGNOSIS — S83241A Other tear of medial meniscus, current injury, right knee, initial encounter: Secondary | ICD-10-CM | POA: Diagnosis not present

## 2018-01-23 DIAGNOSIS — Z7951 Long term (current) use of inhaled steroids: Secondary | ICD-10-CM | POA: Diagnosis not present

## 2018-01-23 DIAGNOSIS — Z79890 Hormone replacement therapy (postmenopausal): Secondary | ICD-10-CM | POA: Diagnosis not present

## 2018-01-23 DIAGNOSIS — Z7982 Long term (current) use of aspirin: Secondary | ICD-10-CM | POA: Diagnosis not present

## 2018-01-23 DIAGNOSIS — I10 Essential (primary) hypertension: Secondary | ICD-10-CM | POA: Diagnosis not present

## 2018-01-23 DIAGNOSIS — S83241A Other tear of medial meniscus, current injury, right knee, initial encounter: Secondary | ICD-10-CM | POA: Diagnosis not present

## 2018-01-23 DIAGNOSIS — Z888 Allergy status to other drugs, medicaments and biological substances status: Secondary | ICD-10-CM | POA: Diagnosis not present

## 2018-01-23 DIAGNOSIS — J45909 Unspecified asthma, uncomplicated: Secondary | ICD-10-CM | POA: Diagnosis not present

## 2018-01-23 DIAGNOSIS — S83231A Complex tear of medial meniscus, current injury, right knee, initial encounter: Secondary | ICD-10-CM | POA: Diagnosis not present

## 2018-01-23 DIAGNOSIS — Z885 Allergy status to narcotic agent status: Secondary | ICD-10-CM | POA: Diagnosis not present

## 2018-01-23 DIAGNOSIS — Z79899 Other long term (current) drug therapy: Secondary | ICD-10-CM | POA: Diagnosis not present

## 2018-02-12 ENCOUNTER — Encounter: Payer: Self-pay | Admitting: Physical Therapy

## 2018-02-12 ENCOUNTER — Other Ambulatory Visit: Payer: Self-pay

## 2018-02-12 ENCOUNTER — Ambulatory Visit: Payer: BLUE CROSS/BLUE SHIELD | Attending: Physician Assistant | Admitting: Physical Therapy

## 2018-02-12 DIAGNOSIS — R600 Localized edema: Secondary | ICD-10-CM | POA: Diagnosis not present

## 2018-02-12 DIAGNOSIS — M25661 Stiffness of right knee, not elsewhere classified: Secondary | ICD-10-CM

## 2018-02-12 DIAGNOSIS — M25561 Pain in right knee: Secondary | ICD-10-CM

## 2018-02-12 NOTE — Therapy (Signed)
[facility] [place], [place], [address] Phone: [phone]   Fax:  [phone]  Physical Therapy Evaluation  Patient Details  Name: Traci Johnson MRN: [id number] Date of Birth: 1950-11-19 Referring Provider: Jaynee Eagles MD.   Encounter Date: 02/12/2018  PT End of Session - 02/12/18 1626    Visit Number  1    Number of Visits  12    Date for PT Re-Evaluation  03/26/18    PT Start Time  0232    PT Stop Time  0320    PT Time Calculation (min)  48 min    Activity Tolerance  Patient tolerated treatment well    Behavior During Therapy  [facility] for tasks assessed/performed       Past Medical History:  Diagnosis Date   Allergy    Asthma    Hypertension    Migraines    cluster    Past Surgical History:  Procedure Laterality Date   ABDOMINAL HYSTERECTOMY  1985   CHOLECYSTECTOMY N/A 05/02/2017   Procedure: LAPAROSCOPIC CHOLECYSTECTOMY;  Surgeon: Mickeal Skinner, MD;  Location: [facility];  Service: General;  Laterality: N/A;   LEFT HEART CATH AND CORONARY ANGIOGRAPHY N/A 04/30/2017   Procedure: LEFT HEART CATH AND CORONARY ANGIOGRAPHY;  Surgeon: Lorretta Harp, MD;  Location: [facility];  Service: Cardiovascular;  Laterality: N/A;   LIGAMENT REPAIR Right    birth defect    There were no vitals filed for this visit.   Subjective Assessment - 02/12/18 1632    Subjective  The patient underwent a right knee arthroscopic surgery on 01/23/18.  She is discouraged as her pain has not let up.  She reports a stabbing pain in the area of her right medial knee.  Her pain is rated at a high 8/10 today.  Pain increases with walking.  Icing decreases pain.  Patient states she has been compliant to her HEP.    Pertinent History  Right knee surgery in 1974 to "relocate kneecap"; HTN.    Limitations  Walking    How long can you walk comfortably?  Short distances with axillary crutches.    Patient Stated Goals  Get out  of pain.      Currently in Pain?  Yes    Pain Score  8     Pain Location  Knee    Pain Orientation  Right;Medial    Pain Descriptors / Indicators  Stabbing    Pain Type  Surgical pain    Pain Onset  1 to 4 weeks ago    Pain Frequency  Constant    Aggravating Factors   See above.    Pain Relieving Factors  See above.         [facility] PT Assessment - 02/12/18 0001      Assessment   Medical Diagnosis  Right knee arthroscopic surgery.    Referring Provider  Jaynee Eagles MD.    Onset Date/Surgical Date  --   01/23/18 (surgery date).     Precautions   Precautions  --   PAIN-FREE RIGHT KNEE EXERCISES.     Restrictions   Weight Bearing Restrictions  No      Balance Screen   Has the patient fallen in the past 6 months  No    Has the patient had a decrease in activity level because of a fear of falling?   Yes    Is the patient reluctant to leave their home because of a fear  of falling?   No      Home Environment   Living Environment  Private residence      Prior Function   Level of Independence  Independent      Observation/Other Assessments   Observations  --   Right knee scope sites look to be healing well.   Focus on Therapeutic Outcomes (FOTO)   --   74% limitation.     Observation/Other Assessments-Edema    Edema  Circumferential      Circumferential Edema   Circumferential - Right  RT 2 cms > LT.      ROM / Strength   AROM / PROM / Strength  AROM;Strength      AROM   Overall AROM Comments  Right knee active extension= -10 degrees and flexion= 125 degrees.      Strength   Overall Strength Comments  Right hip flexion= 4+/5 and right knee extension graded grossly at 4/5.      Palpation   Palpation comment  Patient c/o tenderness over right knee medial joint line.      Ambulation/Gait   Gait Comments  Patient's gait remarkable for antalgia with bilateral axillary crutches.                Objective measurements completed on examination: See above  findings.      [facility] Adult PT Treatment/Exercise - 02/12/18 0001      Modalities   Modalities  Electrical Stimulation;Vasopneumatic      Electrical Stimulation   Electrical Stimulation Location  Right knee.    Electrical Stimulation Action  IFC    Electrical Stimulation Parameters  1-10 Hz x 20 minutes.    Electrical Stimulation Goals  Edema;Pain      Vasopneumatic   Number Minutes Vasopneumatic   20 minutes    Vasopnuematic Location   --   Right knee.   Vasopneumatic Pressure  Low                  PT Long Term Goals - 02/12/18 1704      PT LONG TERM GOAL #1   Title  Independent with a HEP.    Time  6    Period  Weeks    Status  New      PT LONG TERM GOAL #2   Title  Full right active knee extension in order to normalize gait.    Time  6    Period  Weeks    Status  New      PT LONG TERM GOAL #3   Title  Increase right knee strength to a solid 5/5 to provide good stability for accomplishment of functional activities.    Time  6    Period  Weeks    Status  New      PT LONG TERM GOAL #4   Title  Perform a reciprocating stair gait with one railing with pain not > 2-3/10.    Time  6    Period  Weeks    Status  New      PT LONG TERM GOAL #5   Title  Walk a community distance without assistive device and pain not > 2-3/10.    Time  6    Period  Weeks    Status  New             Plan - 02/12/18 1645    Clinical Impression Statement  The patient presents to OPPT s/p right knee arthroscopic surgery  performed on 01/23/18.  She is continuing to have a great deal of pain that is localized to the region of her right medial joing line.  She has an expected amount of edema.  Her gait is antalgic and she is using bilateral axillary crutches.  Her pain and deficits have limited her functional mobility currently. Patient will benefit from skilled physical therapy intervention to address deficits and pain.        Patient will benefit from skilled therapeutic  intervention in order to improve the following deficits and impairments:     Visit Diagnosis: Acute pain of right knee - Plan: PT plan of care cert/re-cert  Stiffness of right knee, not elsewhere classified - Plan: PT plan of care cert/re-cert  Localized edema - Plan: PT plan of care cert/re-cert     Problem List Patient Active Problem List   Diagnosis Date Noted   Orthostatic hypotension 05/03/2017   Syncope due to orthostatic hypotension 05/03/2017   Symptomatic cholelithiasis 05/02/2017   Acute cholecystitis 05/02/2017   Abnormal nuclear stress test    Chest pain 04/27/2017   Hyperglycemia 04/27/2017   Anemia 04/27/2017   Essential hypertension 07/15/2014   Asthma, chronic 07/15/2014   Vitamin D deficiency 07/15/2014    Daena Alper, [place] MPT 02/12/2018, 5:20 PM  [facility] [address] [place], [place], [address] Phone: [phone]   Fax:  [phone]  Name: Traci Johnson MRN: [id number] Date of Birth: 17-Aug-1950

## 2018-02-14 ENCOUNTER — Other Ambulatory Visit (HOSPITAL_COMMUNITY): Payer: BLUE CROSS/BLUE SHIELD

## 2018-02-14 ENCOUNTER — Ambulatory Visit: Payer: BLUE CROSS/BLUE SHIELD | Admitting: Physical Therapy

## 2018-02-14 ENCOUNTER — Encounter: Payer: Self-pay | Admitting: Physical Therapy

## 2018-02-14 DIAGNOSIS — R600 Localized edema: Secondary | ICD-10-CM | POA: Diagnosis not present

## 2018-02-14 DIAGNOSIS — M25561 Pain in right knee: Secondary | ICD-10-CM

## 2018-02-14 DIAGNOSIS — M25661 Stiffness of right knee, not elsewhere classified: Secondary | ICD-10-CM

## 2018-02-14 NOTE — Therapy (Signed)
[facility] [place], [place], [address] Phone: [phone]   Fax:  [phone]  Physical Therapy Treatment  Patient Details  Name: Traci Johnson MRN: [id number] Date of Birth: 1951/02/12 Referring Provider: Jaynee Eagles MD.   Encounter Date: 02/14/2018  PT End of Session - 02/14/18 1506    Visit Number  2    Number of Visits  12    Date for PT Re-Evaluation  03/26/18    PT Start Time  5726    PT Stop Time  2035    PT Time Calculation (min)  50 min    Activity Tolerance  Patient tolerated treatment well    Behavior During Therapy  [facility] for tasks assessed/performed       Past Medical History:  Diagnosis Date   Allergy    Asthma    Hypertension    Migraines    cluster    Past Surgical History:  Procedure Laterality Date   ABDOMINAL HYSTERECTOMY  1985   CHOLECYSTECTOMY N/A 05/02/2017   Procedure: LAPAROSCOPIC CHOLECYSTECTOMY;  Surgeon: Mickeal Skinner, MD;  Location: [facility];  Service: General;  Laterality: N/A;   LEFT HEART CATH AND CORONARY ANGIOGRAPHY N/A 04/30/2017   Procedure: LEFT HEART CATH AND CORONARY ANGIOGRAPHY;  Surgeon: Lorretta Harp, MD;  Location: [facility];  Service: Cardiovascular;  Laterality: N/A;   LIGAMENT REPAIR Right    birth defect    There were no vitals filed for this visit.  Subjective Assessment - 02/14/18 1424    Subjective  Patient arrived with some discomfort and using crutches today    Pertinent History  Right knee surgery in 1974 to "relocate kneecap"; HTN.    Limitations  Walking    How long can you walk comfortably?  Short distances with axillary crutches.    Patient Stated Goals  Get out of pain.      Currently in Pain?  Yes    Pain Score  5     Pain Location  Knee    Pain Orientation  Right;Medial    Pain Descriptors / Indicators  Stabbing;Sharp    Pain Type  Surgical pain    Pain Onset  1 to 4 weeks ago    Pain Frequency  Constant     Aggravating Factors   standing or laying down    Pain Relieving Factors  sittting at rest                       [facility] Adult PT Treatment/Exercise - 02/14/18 0001      Exercises   Exercises  Knee/Hip      Knee/Hip Exercises: Aerobic   Nustep  L3 x76min UE/LE       Knee/Hip Exercises: Seated   Long Arc Quad  Strengthening;Right;3 sets;10 reps      Knee/Hip Exercises: Supine   Quad Sets  Strengthening;Right;2 sets;10 reps    Short Arc [employer]  Strengthening;Right;2 sets;10 reps    Other Supine Knee/Hip Exercises  hip abd with red t-band x30       Acupuncturist Stimulation Location  Right knee.    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C    Electrical Stimulation Parameters  1-10hz  x34min    Electrical Stimulation Goals  Edema;Pain      Vasopneumatic   Number Minutes Vasopneumatic   15 minutes    Vasopnuematic Location   Knee    Vasopneumatic Pressure  Low  Manual Therapy   Manual Therapy  Soft tissue mobilization;Passive ROM    Soft tissue mobilization  gentle STW to knee medial and lateral side and quad muscle to reduce pain and tone    Passive ROM  gentle ext stretch to improve ROM                  PT Long Term Goals - 02/14/18 1439      PT LONG TERM GOAL #1   Title  Independent with a HEP.    Time  6    Status  On-going      PT LONG TERM GOAL #2   Title  Full right active knee extension in order to normalize gait.    Time  6    Period  Weeks    Status  On-going      PT LONG TERM GOAL #3   Title  Increase right knee strength to a solid 5/5 to provide good stability for accomplishment of functional activities.    Time  6    Period  Weeks    Status  On-going      PT LONG TERM GOAL #4   Title  Perform a reciprocating stair gait with one railing with pain not > 2-3/10.    Time  6    Period  Weeks    Status  On-going      PT LONG TERM GOAL #5   Title  Walk a community distance without assistive device and pain  not > 2-3/10.    Time  6    Period  Weeks    Status  On-going            Plan - 02/14/18 1507    Clinical Impression Statement  Patient tolerated treatment well today. patient has most discomfort with any standing activity. Today focused on sitting and supine gentle pain free strengthening and STW to reduce pain. Patient reported decreased pain post treatment and goals ongoing at this time.     PT Next Visit Plan  cont with POC for pain free strengthening and modalities PRN per PT / F/U sent to PT to complete Plan section    Consulted and Agree with Plan of Care  Patient       Patient will benefit from skilled therapeutic intervention in order to improve the following deficits and impairments:     Visit Diagnosis: Acute pain of right knee  Stiffness of right knee, not elsewhere classified  Localized edema     Problem List Patient Active Problem List   Diagnosis Date Noted   Orthostatic hypotension 05/03/2017   Syncope due to orthostatic hypotension 05/03/2017   Symptomatic cholelithiasis 05/02/2017   Acute cholecystitis 05/02/2017   Abnormal nuclear stress test    Chest pain 04/27/2017   Hyperglycemia 04/27/2017   Anemia 04/27/2017   Essential hypertension 07/15/2014   Asthma, chronic 07/15/2014   Vitamin D deficiency 07/15/2014    Ladean Raya, PTA 02/14/18 3:17 PM  [facility] [address] [place], [place], [address] Phone: [phone]   Fax:  [phone]  Name: Traci Johnson MRN: [id number] Date of Birth: Oct 12, 1950

## 2018-02-18 ENCOUNTER — Encounter: Payer: Self-pay | Admitting: Physical Therapy

## 2018-02-18 ENCOUNTER — Ambulatory Visit: Payer: BLUE CROSS/BLUE SHIELD | Admitting: Physical Therapy

## 2018-02-18 DIAGNOSIS — R600 Localized edema: Secondary | ICD-10-CM | POA: Diagnosis not present

## 2018-02-18 DIAGNOSIS — M25561 Pain in right knee: Secondary | ICD-10-CM | POA: Diagnosis not present

## 2018-02-18 DIAGNOSIS — M25661 Stiffness of right knee, not elsewhere classified: Secondary | ICD-10-CM

## 2018-02-18 NOTE — Therapy (Signed)
[facility] [place], [place], [address] Phone: [phone]   Fax:  [phone]  Physical Therapy Treatment  Patient Details  Name: Traci Johnson MRN: [id number] Date of Birth: 02/12/1951 Referring Provider: Jaynee Eagles MD.   Encounter Date: 02/18/2018  PT End of Session - 02/18/18 1103    Visit Number  3    Date for PT Re-Evaluation  03/26/18    PT Start Time  0946    PT Stop Time  5956    PT Time Calculation (min)  58 min    Activity Tolerance  Patient tolerated treatment well    Behavior During Therapy  [facility] for tasks assessed/performed       Past Medical History:  Diagnosis Date   Allergy    Asthma    Hypertension    Migraines    cluster    Past Surgical History:  Procedure Laterality Date   ABDOMINAL HYSTERECTOMY  1985   CHOLECYSTECTOMY N/A 05/02/2017   Procedure: LAPAROSCOPIC CHOLECYSTECTOMY;  Surgeon: Mickeal Skinner, MD;  Location: [facility];  Service: General;  Laterality: N/A;   LEFT HEART CATH AND CORONARY ANGIOGRAPHY N/A 04/30/2017   Procedure: LEFT HEART CATH AND CORONARY ANGIOGRAPHY;  Surgeon: Lorretta Harp, MD;  Location: [facility];  Service: Cardiovascular;  Laterality: N/A;   LIGAMENT REPAIR Right    birth defect    There were no vitals filed for this visit.  Subjective Assessment - 02/18/18 1059    Subjective  I'm doing better.  Pain is a 2/10 but I brought my crutuches just in case.  I still get quite a bit of pain at knee when sleeping on my side even with pillows between my knees.    How long can you walk comfortably?  Short distances with axillary crutches.    Patient Stated Goals  Get out of pain.      Currently in Pain?  Yes    Pain Score  2     Pain Location  Knee    Pain Orientation  Right    Pain Descriptors / Indicators  Sharp;Stabbing    Pain Type  Surgical pain    Pain Onset  1 to 4 weeks ago                       [facility] Adult PT  Treatment/Exercise - 02/18/18 0001      Exercises   Exercises  Knee/Hip      Knee/Hip Exercises: Aerobic   Stationary Bike  Level 1 x 15 minutes.      Modalities   Modalities  Electrical Stimulation;Ultrasound;Vasopneumatic      Electrical Stimulation   Electrical Stimulation Location  Right knee medial knee.    Electrical Stimulation Action  IFC    Electrical Stimulation Parameters  80-150 Hz x 20 minutes.    Electrical Stimulation Goals  Edema;Pain      Ultrasound   Ultrasound Location  Right medial knee.    Ultrasound Parameters  Combo e'stim/U/S at 1.50 W/CM2 x 8 minutes.    Ultrasound Goals  Pain      Vasopneumatic   Number Minutes Vasopneumatic   20 minutes    Vasopnuematic Location   --   Right knee.   Vasopneumatic Pressure  Medium                  PT Long Term Goals - 02/14/18 1439      PT LONG TERM GOAL #  1   Title  Independent with a HEP.    Time  6    Status  On-going      PT LONG TERM GOAL #2   Title  Full right active knee extension in order to normalize gait.    Time  6    Period  Weeks    Status  On-going      PT LONG TERM GOAL #3   Title  Increase right knee strength to a solid 5/5 to provide good stability for accomplishment of functional activities.    Time  6    Period  Weeks    Status  On-going      PT LONG TERM GOAL #4   Title  Perform a reciprocating stair gait with one railing with pain not > 2-3/10.    Time  6    Period  Weeks    Status  On-going      PT LONG TERM GOAL #5   Title  Walk a community distance without assistive device and pain not > 2-3/10.    Time  6    Period  Weeks    Status  On-going            Plan - 02/18/18 1103    Clinical Impression Statement  The patient did a great job with the addition of the stationary bike today.  She is still tender to palpation to her right medial knee region but she felt better after treatment.    Clinical Presentation  Stable    Clinical Decision Making  Low     Rehab Potential  Excellent    PT Frequency  3x / week    PT Duration  4 weeks    PT Treatment/Interventions  ADLs/Self Care Home Management;Cryotherapy;Electrical Stimulation;Therapeutic activities;Therapeutic exercise;Ultrasound;Moist Heat;Patient/family education;Manual techniques;Vasopneumatic Device    PT Next Visit Plan  PAIN-FREE right knee exercises; vaso and e'stim.  Pain-free right quad exercises.    Consulted and Agree with Plan of Care  Patient       Patient will benefit from skilled therapeutic intervention in order to improve the following deficits and impairments:  Pain, Decreased activity tolerance, Decreased strength  Visit Diagnosis: Acute pain of right knee  Stiffness of right knee, not elsewhere classified  Localized edema     Problem List Patient Active Problem List   Diagnosis Date Noted   Orthostatic hypotension 05/03/2017   Syncope due to orthostatic hypotension 05/03/2017   Symptomatic cholelithiasis 05/02/2017   Acute cholecystitis 05/02/2017   Abnormal nuclear stress test    Chest pain 04/27/2017   Hyperglycemia 04/27/2017   Anemia 04/27/2017   Essential hypertension 07/15/2014   Asthma, chronic 07/15/2014   Vitamin D deficiency 07/15/2014    Bruk Tumolo, [place] MPT 02/18/2018, 11:05 AM  [facility] [address] [place], [place], [address] Phone: [phone]   Fax:  [phone]  Name: Traci Johnson MRN: [id number] Date of Birth: [birth year]

## 2018-02-20 ENCOUNTER — Encounter: Payer: Self-pay | Admitting: Physical Therapy

## 2018-02-20 ENCOUNTER — Ambulatory Visit: Payer: BLUE CROSS/BLUE SHIELD | Admitting: Physical Therapy

## 2018-02-20 DIAGNOSIS — M25661 Stiffness of right knee, not elsewhere classified: Secondary | ICD-10-CM | POA: Diagnosis not present

## 2018-02-20 DIAGNOSIS — M25561 Pain in right knee: Secondary | ICD-10-CM | POA: Diagnosis not present

## 2018-02-20 DIAGNOSIS — R600 Localized edema: Secondary | ICD-10-CM | POA: Diagnosis not present

## 2018-02-20 NOTE — Therapy (Signed)
[facility] [place], [place], [address] Phone: [phone]   Fax:  [phone]  Physical Therapy Treatment  Patient Details  Name: Traci Johnson MRN: [id number] Date of Birth: January 28, 1951 Referring Provider: Jaynee Eagles MD.   Encounter Date: 02/20/2018  PT End of Session - 02/20/18 1335    Visit Number  4    Number of Visits  12    Date for PT Re-Evaluation  03/26/18    PT Start Time  1330    PT Stop Time  1419    PT Time Calculation (min)  49 min    Activity Tolerance  Patient tolerated treatment well    Behavior During Therapy  [facility] for tasks assessed/performed       Past Medical History:  Diagnosis Date   Allergy    Asthma    Hypertension    Migraines    cluster    Past Surgical History:  Procedure Laterality Date   ABDOMINAL HYSTERECTOMY  1985   CHOLECYSTECTOMY N/A 05/02/2017   Procedure: LAPAROSCOPIC CHOLECYSTECTOMY;  Surgeon: Mickeal Skinner, MD;  Location: [facility];  Service: General;  Laterality: N/A;   LEFT HEART CATH AND CORONARY ANGIOGRAPHY N/A 04/30/2017   Procedure: LEFT HEART CATH AND CORONARY ANGIOGRAPHY;  Surgeon: Lorretta Harp, MD;  Location: [place] [facility];  Service: Cardiovascular;  Laterality: N/A;   LIGAMENT REPAIR Right    birth defect    There were no vitals filed for this visit.  Subjective Assessment - 02/20/18 1332    Subjective  Reports sharp stabbing pain in medial R knee. Reports also that she may have stretched her HS as she is having pain at distal HS attachment.    Pertinent History  Right knee surgery in 1974 to "relocate kneecap"; HTN.    Limitations  Walking    How long can you walk comfortably?  Short distances with axillary crutches.    Patient Stated Goals  Get out of pain.      Currently in Pain?  Yes    Pain Score  --   No pain score provided   Pain Location  Knee    Pain Orientation  Right;Posterior;Medial    Pain Descriptors / Indicators   Sharp;Stabbing    Pain Type  Surgical pain    Pain Onset  1 to 4 weeks ago         [facility] PT Assessment - 02/20/18 0001      Assessment   Medical Diagnosis  Right knee arthroscopic surgery.    Onset Date/Surgical Date  01/23/18    Next MD Visit  03/08/2018      Restrictions   Weight Bearing Restrictions  No                   [facility] Adult PT Treatment/Exercise - 02/20/18 0001      Exercises   Exercises  Knee/Hip      Knee/Hip Exercises: Aerobic   Nustep  L5 x10 min      Knee/Hip Exercises: Supine   Quad Sets  Strengthening;Right;2 sets;10 reps    Short Arc Quad Sets  Strengthening;Right;2 sets;10 reps    Heel Slides  AROM;Right;20 reps    Hip Adduction Isometric  Strengthening;20 reps    Straight Leg Raises  Other (comment)   Unable secondary to weakness     Modalities   Modalities  Electrical Stimulation;Vasopneumatic      Electrical Stimulation   Electrical Stimulation Location  R knee  Electrical Stimulation Action  IFC    Electrical Stimulation Parameters  80-150 hz x15 min    Electrical Stimulation Goals  Edema;Pain      Vasopneumatic   Number Minutes Vasopneumatic   15 minutes    Vasopnuematic Location   Knee    Vasopneumatic Pressure  Low    Vasopneumatic Temperature   34                  PT Long Term Goals - 02/14/18 1439      PT LONG TERM GOAL #1   Title  Independent with a HEP.    Time  6    Status  On-going      PT LONG TERM GOAL #2   Title  Full right active knee extension in order to normalize gait.    Time  6    Period  Weeks    Status  On-going      PT LONG TERM GOAL #3   Title  Increase right knee strength to a solid 5/5 to provide good stability for accomplishment of functional activities.    Time  6    Period  Weeks    Status  On-going      PT LONG TERM GOAL #4   Title  Perform a reciprocating stair gait with one railing with pain not > 2-3/10.    Time  6    Period  Weeks    Status  On-going      PT  LONG TERM GOAL #5   Title  Walk a community distance without assistive device and pain not > 2-3/10.    Time  6    Period  Weeks    Status  On-going            Plan - 02/20/18 1414    Clinical Impression Statement  Patient tolerated today's treatment fairly well as she was experiencing more medial R knee pain as well as HS discomfort. Patient able to tolerate exercises without any increased pain in medial R knee or HS. Greatest discomfort reported by patient during ambulation but patient does not want to take more pain medication as recommended by MD. Patient compliant with TENS unit, icing and elevation. Patient still ambulating with B axillary crutches. Normal modalities response noted following removal of the modalities.    Rehab Potential  Excellent    PT Frequency  3x / week    PT Duration  4 weeks    PT Treatment/Interventions  ADLs/Self Care Home Management;Cryotherapy;Electrical Stimulation;Therapeutic activities;Therapeutic exercise;Ultrasound;Moist Heat;Patient/family education;Manual techniques;Vasopneumatic Device    PT Next Visit Plan  PAIN-FREE right knee exercises; vaso and e'stim.  Pain-free right quad exercises.    Consulted and Agree with Plan of Care  Patient       Patient will benefit from skilled therapeutic intervention in order to improve the following deficits and impairments:  Pain, Decreased activity tolerance, Decreased strength  Visit Diagnosis: Acute pain of right knee  Stiffness of right knee, not elsewhere classified  Localized edema     Problem List Patient Active Problem List   Diagnosis Date Noted   Orthostatic hypotension 05/03/2017   Syncope due to orthostatic hypotension 05/03/2017   Symptomatic cholelithiasis 05/02/2017   Acute cholecystitis 05/02/2017   Abnormal nuclear stress test    Chest pain 04/27/2017   Hyperglycemia 04/27/2017   Anemia 04/27/2017   Essential hypertension 07/15/2014   Asthma, chronic 07/15/2014    Vitamin D deficiency 07/15/2014    Merleen Nicely P  Laveda Abbe, PTA 02/20/2018, 2:26 PM  [facility] [address] [place], [place], [address] Phone: [phone]   Fax:  [phone]  Name: Traci Johnson MRN: [id number] Date of Birth: 1950/07/29

## 2018-02-21 ENCOUNTER — Ambulatory Visit: Payer: BLUE CROSS/BLUE SHIELD | Admitting: Physical Therapy

## 2018-02-21 ENCOUNTER — Encounter: Payer: Self-pay | Admitting: Physical Therapy

## 2018-02-21 DIAGNOSIS — R600 Localized edema: Secondary | ICD-10-CM | POA: Diagnosis not present

## 2018-02-21 DIAGNOSIS — M25661 Stiffness of right knee, not elsewhere classified: Secondary | ICD-10-CM | POA: Diagnosis not present

## 2018-02-21 DIAGNOSIS — M25561 Pain in right knee: Secondary | ICD-10-CM

## 2018-02-21 NOTE — Therapy (Signed)
[facility] [place], [place], [address] Phone: [phone]   Fax:  [phone]  Physical Therapy Treatment  Patient Details  Name: Traci Johnson MRN: [id number] Date of Birth: January 23, 1951 Referring Provider: Jaynee Eagles MD.   Encounter Date: 02/21/2018  PT End of Session - 02/21/18 1413    Visit Number  5    Number of Visits  12    Date for PT Re-Evaluation  03/26/18    PT Start Time  0165    PT Stop Time  1435    PT Time Calculation (min)  50 min    Activity Tolerance  Patient tolerated treatment well    Behavior During Therapy  [facility] for tasks assessed/performed       Past Medical History:  Diagnosis Date   Allergy    Asthma    Hypertension    Migraines    cluster    Past Surgical History:  Procedure Laterality Date   ABDOMINAL HYSTERECTOMY  1985   CHOLECYSTECTOMY N/A 05/02/2017   Procedure: LAPAROSCOPIC CHOLECYSTECTOMY;  Surgeon: Mickeal Skinner, MD;  Location: [facility];  Service: General;  Laterality: N/A;   LEFT HEART CATH AND CORONARY ANGIOGRAPHY N/A 04/30/2017   Procedure: LEFT HEART CATH AND CORONARY ANGIOGRAPHY;  Surgeon: Lorretta Harp, MD;  Location: [place] [facility];  Service: Cardiovascular;  Laterality: N/A;   LIGAMENT REPAIR Right    birth defect    There were no vitals filed for this visit.  Subjective Assessment - 02/21/18 1350    Subjective  Patient reported some improvement overall yet very sore esp with walking    Pertinent History  Right knee surgery in 1974 to "relocate kneecap"; HTN.    Limitations  Walking    How long can you walk comfortably?  Short distances with axillary crutches.    Patient Stated Goals  Get out of pain.      Currently in Pain?  Yes    Pain Score  4     Pain Location  Knee    Pain Orientation  Right    Pain Descriptors / Indicators  Sore;Discomfort    Pain Type  Surgical pain    Pain Onset  1 to 4 weeks ago    Pain Frequency  Constant     Aggravating Factors   any standing    Pain Relieving Factors  sitting/rest                       [facility] Adult PT Treatment/Exercise - 02/21/18 0001      Exercises   Exercises  Knee/Hip      Knee/Hip Exercises: Aerobic   Nustep  70min L5 UE/LE activity      Knee/Hip Exercises: Supine   Short Arc Quad Sets  Strengthening;Right;Limitations;Other (comment)    Short Arc Quad Sets Limitations  VMS to VMO/quad 10/10 x56min for activation    [employer]  Strengthening;20 reps      Knee/Hip Exercises: Sidelying   Hip ABduction  Strengthening;Right;20 reps    Stage manager  IFC    Electrical Stimulation Parameters  80-150hz  x93min    Printmaker Goals  Edema;Pain      Vasopneumatic   Number Minutes Vasopneumatic   15 minutes    Vasopnuematic Location   Knee    Vasopneumatic Pressure  Low  PT Long Term Goals - 02/14/18 1439      PT LONG TERM GOAL #1   Title  Independent with a HEP.    Time  6    Status  On-going      PT LONG TERM GOAL #2   Title  Full right active knee extension in order to normalize gait.    Time  6    Period  Weeks    Status  On-going      PT LONG TERM GOAL #3   Title  Increase right knee strength to a solid 5/5 to provide good stability for accomplishment of functional activities.    Time  6    Period  Weeks    Status  On-going      PT LONG TERM GOAL #4   Title  Perform a reciprocating stair gait with one railing with pain not > 2-3/10.    Time  6    Period  Weeks    Status  On-going      PT LONG TERM GOAL #5   Title  Walk a community distance without assistive device and pain not > 2-3/10.    Time  6    Period  Weeks    Status  On-going            Plan - 02/21/18 1416    Clinical Impression Statement  Patient tolerated treatment well today. Patient able to progress with seated and supine  exercises today. Patient did well with VMS to quad today to improve activation in muscle. Patient continues to have increased pain with any standing.  Goals ongoing.     Rehab Potential  Excellent    PT Frequency  3x / week    PT Duration  4 weeks    PT Treatment/Interventions  ADLs/Self Care Home Management;Cryotherapy;Electrical Stimulation;Therapeutic activities;Therapeutic exercise;Ultrasound;Moist Heat;Patient/family education;Manual techniques;Vasopneumatic Device    PT Next Visit Plan  assess PAIN-FREE right knee exercises; vaso and e'stim.     Consulted and Agree with Plan of Care  Patient       Patient will benefit from skilled therapeutic intervention in order to improve the following deficits and impairments:  Pain, Decreased activity tolerance, Decreased strength  Visit Diagnosis: Acute pain of right knee  Stiffness of right knee, not elsewhere classified  Localized edema     Problem List Patient Active Problem List   Diagnosis Date Noted   Orthostatic hypotension 05/03/2017   Syncope due to orthostatic hypotension 05/03/2017   Symptomatic cholelithiasis 05/02/2017   Acute cholecystitis 05/02/2017   Abnormal nuclear stress test    Chest pain 04/27/2017   Hyperglycemia 04/27/2017   Anemia 04/27/2017   Essential hypertension 07/15/2014   Asthma, chronic 07/15/2014   Vitamin D deficiency 07/15/2014    Cecil Bixby P, PTA 02/21/2018, 2:41 PM  [facility] [address] [place], [place], [address] Phone: [phone]   Fax:  [phone]  Name: Traci Johnson MRN: [id number] Date of Birth: [birth year]

## 2018-02-25 ENCOUNTER — Ambulatory Visit: Payer: BLUE CROSS/BLUE SHIELD | Admitting: *Deleted

## 2018-02-25 DIAGNOSIS — M25561 Pain in right knee: Secondary | ICD-10-CM | POA: Diagnosis not present

## 2018-02-25 DIAGNOSIS — R600 Localized edema: Secondary | ICD-10-CM

## 2018-02-25 DIAGNOSIS — M25661 Stiffness of right knee, not elsewhere classified: Secondary | ICD-10-CM | POA: Diagnosis not present

## 2018-02-25 NOTE — Therapy (Signed)
[facility] [place], [place], [address] Phone: [phone]   Fax:  [phone]  Physical Therapy Treatment  Patient Details  Name: Traci Johnson MRN: [id number] Date of Birth: June 02, 1951 Referring Provider: Jaynee Eagles MD.   Encounter Date: 02/25/2018  PT End of Session - 02/25/18 0954    Visit Number  6    Number of Visits  12    Date for PT Re-Evaluation  03/26/18    PT Start Time  0945    PT Stop Time  8315    PT Time Calculation (min)  59 min       Past Medical History:  Diagnosis Date   Allergy    Asthma    Hypertension    Migraines    cluster    Past Surgical History:  Procedure Laterality Date   ABDOMINAL HYSTERECTOMY  1985   CHOLECYSTECTOMY N/A 05/02/2017   Procedure: LAPAROSCOPIC CHOLECYSTECTOMY;  Surgeon: Mickeal Skinner, MD;  Location: [facility];  Service: General;  Laterality: N/A;   LEFT HEART CATH AND CORONARY ANGIOGRAPHY N/A 04/30/2017   Procedure: LEFT HEART CATH AND CORONARY ANGIOGRAPHY;  Surgeon: Lorretta Harp, MD;  Location: [place] [facility];  Service: Cardiovascular;  Laterality: N/A;   LIGAMENT REPAIR Right    birth defect    There were no vitals filed for this visit.  Subjective Assessment - 02/25/18 0947    Subjective  Did ok this weekend with less pain. I was able [place] and was surprised how well I did.    Pertinent History  Right knee surgery in 1974 to "relocate kneecap"; HTN.    Limitations  Walking    How long can you walk comfortably?  Short distances with axillary crutches.    Patient Stated Goals  Get out of pain.      Currently in Pain?  Yes    Pain Score  3     Pain Orientation  Right    Pain Descriptors / Indicators  Sore;Discomfort    Pain Onset  1 to 4 weeks ago    Pain Frequency  Constant                       [facility] Adult PT Treatment/Exercise - 02/25/18 0001      Exercises   Exercises  Knee/Hip       Knee/Hip Exercises: Aerobic   Nustep  79min L5 UE/LE activity   seat 8      Knee/Hip Exercises: Standing   Rocker Board  5 minutes      Knee/Hip Exercises: Supine   Short Arc Quad Sets  Strengthening;Right;Limitations;Other (comment)   with VMS   Bridges  --      Modalities   Modalities  Health visitor Stimulation Location  RT knee IFC x 15 mins 80-150hz     Electrical Stimulation Action  VMS x 10 mins 10 secs on/off    Electrical Stimulation Goals  Edema;Pain;Tone      Vasopneumatic   Number Minutes Vasopneumatic   15 minutes    Vasopnuematic Location   Knee    Vasopneumatic Pressure  Low    Vasopneumatic Temperature   34                  PT Long Term Goals - 02/14/18 1439      PT LONG TERM GOAL #1  Title  Independent with a HEP.    Time  6    Status  On-going      PT LONG TERM GOAL #2   Title  Full right active knee extension in order to normalize gait.    Time  6    Period  Weeks    Status  On-going      PT LONG TERM GOAL #3   Title  Increase right knee strength to a solid 5/5 to provide good stability for accomplishment of functional activities.    Time  6    Period  Weeks    Status  On-going      PT LONG TERM GOAL #4   Title  Perform a reciprocating stair gait with one railing with pain not > 2-3/10.    Time  6    Period  Weeks    Status  On-going      PT LONG TERM GOAL #5   Title  Walk a community distance without assistive device and pain not > 2-3/10.    Time  6    Period  Weeks    Status  On-going            Plan - 02/25/18 1316    Clinical Impression Statement  Pt arrived today reporting that her knee is doing better with less pain and that she did more walking this weekend and was suprised that her knee isn't hurting more. She was able to perform therex today for RT knee and did well. Normal modality response today.    Clinical Presentation  Stable    Clinical  Decision Making  Low    Rehab Potential  Excellent    PT Frequency  3x / week    PT Duration  4 weeks    PT Treatment/Interventions  ADLs/Self Care Home Management;Cryotherapy;Electrical Stimulation;Therapeutic activities;Therapeutic exercise;Ultrasound;Moist Heat;Patient/family education;Manual techniques;Vasopneumatic Device    PT Next Visit Plan  assess PAIN-FREE right knee exercises; vaso and e'stim.     Consulted and Agree with Plan of Care  Patient       Patient will benefit from skilled therapeutic intervention in order to improve the following deficits and impairments:  Pain, Decreased activity tolerance, Decreased strength  Visit Diagnosis: Acute pain of right knee  Stiffness of right knee, not elsewhere classified  Localized edema     Problem List Patient Active Problem List   Diagnosis Date Noted   Orthostatic hypotension 05/03/2017   Syncope due to orthostatic hypotension 05/03/2017   Symptomatic cholelithiasis 05/02/2017   Acute cholecystitis 05/02/2017   Abnormal nuclear stress test    Chest pain 04/27/2017   Hyperglycemia 04/27/2017   Anemia 04/27/2017   Essential hypertension 07/15/2014   Asthma, chronic 07/15/2014   Vitamin D deficiency 07/15/2014    Traci Johnson,CHRIS, PTA 02/25/2018, 1:27 PM  [facility] [address] [place], [place], [address] Phone: [phone]   Fax:  [phone]  Name: Traci Johnson MRN: [id number] Date of Birth: 1951/03/14

## 2018-02-27 ENCOUNTER — Ambulatory Visit: Payer: BLUE CROSS/BLUE SHIELD | Admitting: Physical Therapy

## 2018-02-27 ENCOUNTER — Encounter: Payer: Self-pay | Admitting: Physical Therapy

## 2018-02-27 DIAGNOSIS — M25561 Pain in right knee: Secondary | ICD-10-CM

## 2018-02-27 DIAGNOSIS — R600 Localized edema: Secondary | ICD-10-CM | POA: Diagnosis not present

## 2018-02-27 DIAGNOSIS — M25661 Stiffness of right knee, not elsewhere classified: Secondary | ICD-10-CM

## 2018-02-27 NOTE — Therapy (Signed)
[facility] [place], [place], [address] Phone: [phone]   Fax:  [phone]  Physical Therapy Treatment  Patient Details  Name: Traci Johnson MRN: [id number] Date of Birth: 1950-10-24 Referring Provider: Jaynee Eagles MD.   Encounter Date: 02/27/2018  PT End of Session - 02/27/18 1409    Visit Number  7    Number of Visits  12    Date for PT Re-Evaluation  03/26/18    PT Start Time  1829    PT Stop Time  1448    PT Time Calculation (min)  62 min    Activity Tolerance  Patient tolerated treatment well    Behavior During Therapy  [facility] for tasks assessed/performed       Past Medical History:  Diagnosis Date   Allergy    Asthma    Hypertension    Migraines    cluster    Past Surgical History:  Procedure Laterality Date   ABDOMINAL HYSTERECTOMY  1985   CHOLECYSTECTOMY N/A 05/02/2017   Procedure: LAPAROSCOPIC CHOLECYSTECTOMY;  Surgeon: Mickeal Skinner, MD;  Location: [facility];  Service: General;  Laterality: N/A;   LEFT HEART CATH AND CORONARY ANGIOGRAPHY N/A 04/30/2017   Procedure: LEFT HEART CATH AND CORONARY ANGIOGRAPHY;  Surgeon: Lorretta Harp, MD;  Location: [place] [facility];  Service: Cardiovascular;  Laterality: N/A;   LIGAMENT REPAIR Right    birth defect    There were no vitals filed for this visit.  Subjective Assessment - 02/27/18 1350    Subjective  Patient reported she's been having a slight pain, 2/10.    Pertinent History  Right knee surgery in 1974 to "relocate kneecap"; HTN.    Limitations  Walking    How long can you walk comfortably?  Short distances with axillary crutches.    Patient Stated Goals  Get out of pain.      Currently in Pain?  Yes    Pain Score  2     Pain Location  Knee    Pain Orientation  Right    Pain Descriptors / Indicators  Sore;Discomfort    Pain Type  Surgical pain    Pain Onset  1 to 4 weeks ago         [facility] PT Assessment - 02/27/18 0001       Assessment   Medical Diagnosis  Right knee arthroscopic surgery.    Onset Date/Surgical Date  01/23/18    Next MD Visit  03/08/2018      Restrictions   Weight Bearing Restrictions  No                   [facility] Adult PT Treatment/Exercise - 02/27/18 0001      Exercises   Exercises  Knee/Hip      Knee/Hip Exercises: Aerobic   Nustep  15 min L6 UE/LE activity   seat 7      Knee/Hip Exercises: Supine   Short Arc Quad Sets  Strengthening;Right;Limitations;Other (comment)    Short Arc Quad Sets Limitations  VMS to VMO/Quad 10/10; 300 usec, x10 minutes    Bridges  Strengthening;20 reps      Knee/Hip Exercises: Sidelying   Clams  x20      Modalities   Modalities  Psychologist, educational Location  RT knee IFC x 15 mins 80-150hz     Electrical Stimulation Action  IFC    Electrical Stimulation Parameters  80-150 hz x15 min    Electrical Stimulation Goals  Edema;Pain;Tone      Vasopneumatic   Number Minutes Vasopneumatic   15 minutes    Vasopnuematic Location   Knee    Vasopneumatic Pressure  Low    Vasopneumatic Temperature   34                  PT Long Term Goals - 02/14/18 1439      PT LONG TERM GOAL #1   Title  Independent with a HEP.    Time  6    Status  On-going      PT LONG TERM GOAL #2   Title  Full right active knee extension in order to normalize gait.    Time  6    Period  Weeks    Status  On-going      PT LONG TERM GOAL #3   Title  Increase right knee strength to a solid 5/5 to provide good stability for accomplishment of functional activities.    Time  6    Period  Weeks    Status  On-going      PT LONG TERM GOAL #4   Title  Perform a reciprocating stair gait with one railing with pain not > 2-3/10.    Time  6    Period  Weeks    Status  On-going      PT LONG TERM GOAL #5   Title  Walk a community distance without assistive device and pain not > 2-3/10.     Time  6    Period  Weeks    Status  On-going            Plan - 02/27/18 1411    Clinical Impression Statement  Patient was able to tolerate treatment well with no reports of pain during exercise. Patient was able to tolerate progression of nustep without pain. Patient instructed to perform gentle AROM in bed prior to getting out of bed to reduce morning joint stiffness. Patient reported understanding.     Clinical Presentation  Stable    Clinical Decision Making  Low    Rehab Potential  Excellent    PT Frequency  3x / week    PT Duration  4 weeks    PT Treatment/Interventions  ADLs/Self Care Home Management;Cryotherapy;Electrical Stimulation;Therapeutic activities;Therapeutic exercise;Ultrasound;Moist Heat;Patient/family education;Manual techniques;Vasopneumatic Device    PT Next Visit Plan  PAIN-FREE right knee exercises; vaso and e'stim.     Consulted and Agree with Plan of Care  Patient       Patient will benefit from skilled therapeutic intervention in order to improve the following deficits and impairments:  Pain, Decreased activity tolerance, Decreased strength  Visit Diagnosis: Acute pain of right knee  Stiffness of right knee, not elsewhere classified  Localized edema     Problem List Patient Active Problem List   Diagnosis Date Noted   Orthostatic hypotension 05/03/2017   Syncope due to orthostatic hypotension 05/03/2017   Symptomatic cholelithiasis 05/02/2017   Acute cholecystitis 05/02/2017   Abnormal nuclear stress test    Chest pain 04/27/2017   Hyperglycemia 04/27/2017   Anemia 04/27/2017   Essential hypertension 07/15/2014   Asthma, chronic 07/15/2014   Vitamin D deficiency 07/15/2014   Gabriela Eves, PT, DPT 02/27/2018, 2:49 PM  [facility] [address] [place], [place], [address] Phone: [phone]   Fax:  [phone]  Name: Traci Johnson MRN: [id number] Date of Birth:  1950-09-29

## 2018-02-28 ENCOUNTER — Encounter: Payer: Self-pay | Admitting: Physical Therapy

## 2018-02-28 ENCOUNTER — Ambulatory Visit: Payer: BLUE CROSS/BLUE SHIELD | Admitting: Physical Therapy

## 2018-02-28 DIAGNOSIS — M25661 Stiffness of right knee, not elsewhere classified: Secondary | ICD-10-CM

## 2018-02-28 DIAGNOSIS — R600 Localized edema: Secondary | ICD-10-CM | POA: Diagnosis not present

## 2018-02-28 DIAGNOSIS — M25561 Pain in right knee: Secondary | ICD-10-CM

## 2018-02-28 NOTE — Therapy (Signed)
[facility] [place], [place], [address] Phone: [phone]   Fax:  [phone]  Physical Therapy Treatment  Patient Details  Name: Traci Johnson MRN: [id number] Date of Birth: 11/09/1950 Referring Provider (PT): Jaynee Eagles MD.   Encounter Date: 02/28/2018  PT End of Session - 02/28/18 1349    Visit Number  8    Number of Visits  12    Date for PT Re-Evaluation  03/26/18    PT Start Time  6720    PT Stop Time  9470    PT Time Calculation (min)  49 min    Activity Tolerance  Patient tolerated treatment well    Behavior During Therapy  [facility] for tasks assessed/performed       Past Medical History:  Diagnosis Date   Allergy    Asthma    Hypertension    Migraines    cluster    Past Surgical History:  Procedure Laterality Date   ABDOMINAL HYSTERECTOMY  1985   CHOLECYSTECTOMY N/A 05/02/2017   Procedure: LAPAROSCOPIC CHOLECYSTECTOMY;  Surgeon: Mickeal Skinner, MD;  Location: [facility];  Service: General;  Laterality: N/A;   LEFT HEART CATH AND CORONARY ANGIOGRAPHY N/A 04/30/2017   Procedure: LEFT HEART CATH AND CORONARY ANGIOGRAPHY;  Surgeon: Lorretta Harp, MD;  Location: [place] [facility];  Service: Cardiovascular;  Laterality: N/A;   LIGAMENT REPAIR Right    birth defect    There were no vitals filed for this visit.  Subjective Assessment - 02/28/18 1344    Subjective  Reports that she still has stabbing pain in medial R knee but only during ambulation. Reports that she was able to walk around work today without using a WC to help her.    Pertinent History  Right knee surgery in 1974 to "relocate kneecap"; HTN.    Limitations  Walking    How long can you walk comfortably?  Short distances with axillary crutches.    Patient Stated Goals  Get out of pain.      Currently in Pain?  No/denies         [facility] PT Assessment - 02/28/18 0001      Assessment   Medical Diagnosis  Right knee arthroscopic  surgery.    Onset Date/Surgical Date  01/23/18    Next MD Visit  03/06/2018      Restrictions   Weight Bearing Restrictions  No                   [facility] Adult PT Treatment/Exercise - 02/28/18 0001      Exercises   Exercises  Knee/Hip      Knee/Hip Exercises: Aerobic   Nustep  L5 x15 min      Knee/Hip Exercises: Standing   Forward Lunges  Right;10 reps;2 seconds    Hip Abduction  AROM;Right;2 sets;10 reps;Knee straight      Knee/Hip Exercises: Seated   Long Arc Quad  Strengthening;Right;2 sets;10 reps;Weights    Long Arc Quad Weight  3 lbs.      Knee/Hip Exercises: Supine   Hip Adduction Isometric  Strengthening;20 reps    Straight Leg Raises  AROM;Right;2 sets;10 reps    Other Supine Knee/Hip Exercises  B hip clam green theraband      Modalities   Modalities  Vasopneumatic      Vasopneumatic   Number Minutes Vasopneumatic   15 minutes    Vasopnuematic Location   Knee    Vasopneumatic Pressure  Low  Vasopneumatic Temperature   57                  PT Long Term Goals - 02/14/18 1439      PT LONG TERM GOAL #1   Title  Independent with a HEP.    Time  6    Status  On-going      PT LONG TERM GOAL #2   Title  Full right active knee extension in order to normalize gait.    Time  6    Period  Weeks    Status  On-going      PT LONG TERM GOAL #3   Title  Increase right knee strength to a solid 5/5 to provide good stability for accomplishment of functional activities.    Time  6    Period  Weeks    Status  On-going      PT LONG TERM GOAL #4   Title  Perform a reciprocating stair gait with one railing with pain not > 2-3/10.    Time  6    Period  Weeks    Status  On-going      PT LONG TERM GOAL #5   Title  Walk a community distance without assistive device and pain not > 2-3/10.    Time  6    Period  Weeks    Status  On-going            Plan - 02/28/18 1426    Clinical Impression Statement  Patient tolerated today's treatment  well as she is demonstrating improvements in strength and function. Patient did not ambulate into clinic with AD and reports not using WC to hold to at work for longer walks. Patient able to tolerate resisted knee strengthening exercises and able to complete full R SLR today. Patient denied any pain today only stiffness. Normal vasopnuematic response noted following removal of the modalities.    Rehab Potential  Excellent    PT Frequency  3x / week    PT Duration  4 weeks    PT Treatment/Interventions  ADLs/Self Care Home Management;Cryotherapy;Electrical Stimulation;Therapeutic activities;Therapeutic exercise;Ultrasound;Moist Heat;Patient/family education;Manual techniques;Vasopneumatic Device    PT Next Visit Plan  Continue with pain free R knee strengthening exercises with modalities PRN.    Consulted and Agree with Plan of Care  Patient       Patient will benefit from skilled therapeutic intervention in order to improve the following deficits and impairments:  Pain, Decreased activity tolerance, Decreased strength  Visit Diagnosis: Acute pain of right knee  Stiffness of right knee, not elsewhere classified  Localized edema     Problem List Patient Active Problem List   Diagnosis Date Noted   Orthostatic hypotension 05/03/2017   Syncope due to orthostatic hypotension 05/03/2017   Symptomatic cholelithiasis 05/02/2017   Acute cholecystitis 05/02/2017   Abnormal nuclear stress test    Chest pain 04/27/2017   Hyperglycemia 04/27/2017   Anemia 04/27/2017   Essential hypertension 07/15/2014   Asthma, chronic 07/15/2014   Vitamin D deficiency 07/15/2014    Standley Brooking, PTA 02/28/2018, 2:36 PM  [facility] [address] [place], [place], [address] Phone: [phone]   Fax:  [phone]  Name: Juanisha Bautch MRN: [id number] Date of Birth: Dec 01, 1950

## 2018-03-04 ENCOUNTER — Encounter: Payer: Self-pay | Admitting: Physical Therapy

## 2018-03-04 ENCOUNTER — Ambulatory Visit: Payer: BLUE CROSS/BLUE SHIELD | Admitting: Physical Therapy

## 2018-03-04 DIAGNOSIS — R600 Localized edema: Secondary | ICD-10-CM | POA: Diagnosis not present

## 2018-03-04 DIAGNOSIS — M25661 Stiffness of right knee, not elsewhere classified: Secondary | ICD-10-CM | POA: Diagnosis not present

## 2018-03-04 DIAGNOSIS — M25561 Pain in right knee: Secondary | ICD-10-CM | POA: Diagnosis not present

## 2018-03-04 NOTE — Therapy (Signed)
[facility] [place], [place], [address] Phone: [phone]   Fax:  [phone]  Physical Therapy Treatment  Patient Details  Name: Traci Johnson MRN: [id number] Date of Birth: 05-06-1951 Referring Provider (PT): Jaynee Eagles MD.   Encounter Date: 03/04/2018  PT End of Session - 03/04/18 1030    Visit Number  9    Number of Visits  12    Date for PT Re-Evaluation  03/26/18    PT Start Time  0945    PT Stop Time  1696    PT Time Calculation (min)  50 min    Activity Tolerance  Patient tolerated treatment well    Behavior During Therapy  [facility] for tasks assessed/performed       Past Medical History:  Diagnosis Date   Allergy    Asthma    Hypertension    Migraines    cluster    Past Surgical History:  Procedure Laterality Date   ABDOMINAL HYSTERECTOMY  1985   CHOLECYSTECTOMY N/A 05/02/2017   Procedure: LAPAROSCOPIC CHOLECYSTECTOMY;  Surgeon: Mickeal Skinner, MD;  [facility]: [facility];  Service: General;  Laterality: N/A;   LEFT HEART CATH AND CORONARY ANGIOGRAPHY N/A 04/30/2017   Procedure: LEFT HEART CATH AND CORONARY ANGIOGRAPHY;  Surgeon: Lorretta Harp, MD;  Location: [place] [facility];  Service: Cardiovascular;  Laterality: N/A;   LIGAMENT REPAIR Right    birth defect    There were no vitals filed for this visit.  Subjective Assessment - 03/04/18 0954    Subjective  Patient reported feeling fatigue easy and only stabbing pain with standing    Pertinent History  Right knee surgery in 1974 to "relocate kneecap"; HTN.    Limitations  Walking    How long can you walk comfortably?  Short distances with axillary crutches.    Patient Stated Goals  Get out of pain.      Currently in Pain?  No/denies                       [facility] Adult PT Treatment/Exercise - 03/04/18 0001      Exercises   Exercises  Knee/Hip      Knee/Hip Exercises: Aerobic   Nustep  L5 x20 min, UE/LE, monitored  for progression      Knee/Hip Exercises: Seated   Long Arc Quad  Strengthening;Right;10 reps;Weights;3 sets    [employer] Weight  3 lbs.      Knee/Hip Exercises: Supine   Short Arc Quad Sets  Strengthening;Right;3 sets;10 reps    Short Arc Quad Sets Limitations  with 3# and ball squeeze for VMO activation/strength    Bridges with Clamshell  Strengthening;20 reps;Limitations   with green t-band   Straight Leg Raise with External Rotation  Strengthening;Right;2 sets;10 reps      Knee/Hip Exercises: Sidelying   Hip ABduction  Strengthening;Right;20 reps    Clams  x20   soreness from exercise     Electrical Stimulation   Electrical Stimulation Location  RT knee IFC x 10 mins 80-_0     Electrical Stimulation Goals  --   after exercise due to some soreness from exercise     Vasopneumatic   Number Minutes Vasopneumatic   10 minutes    Vasopnuematic Location   Knee    Vasopneumatic Pressure  Low                  PT Long Term Goals - 03/04/18 1000  PT LONG TERM GOAL #1   Title  Independent with a HEP.    Time  6    Period  Weeks    Status  On-going      PT LONG TERM GOAL #2   Title  Full right active knee extension in order to normalize gait.    Time  6    Period  Weeks    Status  On-going      PT LONG TERM GOAL #3   Title  Increase right knee strength to a solid 5/5 to provide good stability for accomplishment of functional activities.    Time  6    Period  Weeks    Status  On-going      PT LONG TERM GOAL #4   Title  Perform a reciprocating stair gait with one railing with pain not > 2-3/10.    Time  6    Period  Weeks    Status  On-going      PT LONG TERM GOAL #5   Title  Walk a community distance without assistive device and pain not > 2-3/10.    Time  6    Period  Weeks    Status  Partially Met   able to walk without assistive device yet sharp pain level up to 8/10 at times 03/04/18           Plan - 03/04/18 1030    Clinical  Impression Statement  Patient tolerated treatment well today. Patient able to increase activity tolerance today and progress with exercises pain free today, only sidelying clamshell caused soreness today. Patient going to MD for F/U wednesday and will need note tomorrow. Patient has no pain at rest and seaded yet sharp pain at times with standing or walking. Goals ongoing at this time.     Rehab Potential  Excellent    PT Frequency  3x / week    PT Duration  4 weeks    PT Treatment/Interventions  ADLs/Self Care Home Management;Cryotherapy;Electrical Stimulation;Therapeutic activities;Therapeutic exercise;Ultrasound;Moist Heat;Patient/family education;Manual techniques;Vasopneumatic Device    PT Next Visit Plan  Continue with pain free R knee strengthening exercises with modalities PRN. MD note next visit    Consulted and Agree with Plan of Care  Patient       Patient will benefit from skilled therapeutic intervention in order to improve the following deficits and impairments:  Pain, Decreased activity tolerance, Decreased strength  Visit Diagnosis: Acute pain of right knee  Stiffness of right knee, not elsewhere classified  Localized edema     Problem List Patient Active Problem List   Diagnosis Date Noted   Orthostatic hypotension 05/03/2017   Syncope due to orthostatic hypotension 05/03/2017   Symptomatic cholelithiasis 05/02/2017   Acute cholecystitis 05/02/2017   Abnormal nuclear stress test    Chest pain 04/27/2017   Hyperglycemia 04/27/2017   Anemia 04/27/2017   Essential hypertension 07/15/2014   Asthma, chronic 07/15/2014   Vitamin D deficiency 07/15/2014    Jamice Carreno P, PTA 03/04/2018, 10:43 AM  [facility] [address] [place], [place], [address] Phone: [phone]   Fax:  [phone]  Name: Traci Johnson MRN: [id number] Date of Birth: Oct 28, 1950

## 2018-03-05 ENCOUNTER — Ambulatory Visit: Payer: BLUE CROSS/BLUE SHIELD | Attending: Physician Assistant | Admitting: Physical Therapy

## 2018-03-05 DIAGNOSIS — R600 Localized edema: Secondary | ICD-10-CM

## 2018-03-05 DIAGNOSIS — M25561 Pain in right knee: Secondary | ICD-10-CM | POA: Diagnosis not present

## 2018-03-05 DIAGNOSIS — M25661 Stiffness of right knee, not elsewhere classified: Secondary | ICD-10-CM | POA: Diagnosis not present

## 2018-03-05 NOTE — Therapy (Signed)
[facility] [place], [place], [address] Phone: [phone]   Fax:  [phone]  Physical Therapy Treatment  Patient Details  Name: Traci Johnson MRN: [id number] Date of Birth: 04-28-1951 Referring Provider (PT): Jaynee Eagles MD.   Encounter Date: 03/05/2018  PT End of Session - 03/05/18 1455    Visit Number  10    Number of Visits  12    Date for PT Re-Evaluation  03/26/18    PT Start Time  0145    PT Stop Time  0239    PT Time Calculation (min)  54 min    Activity Tolerance  Patient tolerated treatment well    Behavior During Therapy  [facility] for tasks assessed/performed       Past Medical History:  Diagnosis Date   Allergy    Asthma    Hypertension    Migraines    cluster    Past Surgical History:  Procedure Laterality Date   ABDOMINAL HYSTERECTOMY  1985   CHOLECYSTECTOMY N/A 05/02/2017   Procedure: LAPAROSCOPIC CHOLECYSTECTOMY;  Surgeon: Mickeal Skinner, MD;  [facility]: [facility];  Service: General;  Laterality: N/A;   LEFT HEART CATH AND CORONARY ANGIOGRAPHY N/A 04/30/2017   Procedure: LEFT HEART CATH AND CORONARY ANGIOGRAPHY;  Surgeon: Lorretta Harp, MD;  Location: [place] [facility];  Service: Cardiovascular;  Laterality: N/A;   LIGAMENT REPAIR Right    birth defect    There were no vitals filed for this visit.  Subjective Assessment - 03/05/18 1424    Subjective  Doing good today.  Walked some already today.  Have been breaking out some on my right knee due to my [place].    Pertinent History  Right knee surgery in 1974 to "relocate kneecap"; HTN.    How long can you walk comfortably?  Short distances with axillary crutches.    Patient Stated Goals  Get out of pain.      Currently in Pain?  Yes    Pain Score  2     Pain Location  Knee    Pain Orientation  Right    Pain Descriptors / Indicators  Sore;Discomfort    Pain Type  Surgical pain    Pain Onset  1 to 4 weeks ago                        [facility] Adult PT Treatment/Exercise - 03/05/18 0001      Exercises   Exercises  Knee/Hip      Knee/Hip Exercises: Aerobic   Nustep  Level 5 x 20 minutes.      Knee/Hip Exercises: Supine   Other Supine Knee/Hip Exercises  5# SAQ's (right knee) x 5 minutes.      Modalities   Modalities  Electrical Stimulation;Vasopneumatic      Electrical Stimulation   Electrical Stimulation Location  Right medial knee.    Electrical Stimulation Action  Pre-mod.    Electrical Stimulation Parameters  80-150 Hz x 20 minutes.    Electrical Stimulation Goals  Pain      Vasopneumatic   Number Minutes Vasopneumatic   20 minutes    Vasopnuematic Location   --   Right knee.   Vasopneumatic Pressure  Low                  PT Long Term Goals - 03/04/18 1000      PT LONG TERM GOAL #1   Title  Independent with  a HEP.    Time  6    Period  Weeks    Status  On-going      PT LONG TERM GOAL #2   Title  Full right active knee extension in order to normalize gait.    Time  6    Period  Weeks    Status  On-going      PT LONG TERM GOAL #3   Title  Increase right knee strength to a solid 5/5 to provide good stability for accomplishment of functional activities.    Time  6    Period  Weeks    Status  On-going      PT LONG TERM GOAL #4   Title  Perform a reciprocating stair gait with one railing with pain not > 2-3/10.    Time  6    Period  Weeks    Status  On-going      PT LONG TERM GOAL #5   Title  Walk a community distance without assistive device and pain not > 2-3/10.    Time  6    Period  Weeks    Status  Partially Met   able to walk without assistive device yet sharp pain level up to 8/10 at times 03/04/18           Plan - 03/05/18 1427    Clinical Impression Statement  Patient doing very well. No observable gait antalgia.  Some redness about right knee due to her topical NSAID.    PT Treatment/Interventions  ADLs/Self Care Home  Management;Cryotherapy;Electrical Stimulation;Therapeutic activities;Therapeutic exercise;Ultrasound;Moist Heat;Patient/family education;Manual techniques;Vasopneumatic Device    PT Next Visit Plan  Continue with pain free R knee strengthening exercises with modalities PRN. MD note next visit    Consulted and Agree with Plan of Care  Patient       Patient will benefit from skilled therapeutic intervention in order to improve the following deficits and impairments:  Pain, Decreased activity tolerance, Decreased strength  Visit Diagnosis: Stiffness of right knee, not elsewhere classified  Acute pain of right knee  Localized edema     Problem List Patient Active Problem List   Diagnosis Date Noted   Orthostatic hypotension 05/03/2017   Syncope due to orthostatic hypotension 05/03/2017   Symptomatic cholelithiasis 05/02/2017   Acute cholecystitis 05/02/2017   Abnormal nuclear stress test    Chest pain 04/27/2017   Hyperglycemia 04/27/2017   Anemia 04/27/2017   Essential hypertension 07/15/2014   Asthma, chronic 07/15/2014   Vitamin D deficiency 07/15/2014    Progress Note Reporting Period 02/12/18 to 03/05/18  See note below for Objective Data and Assessment of Progress/Goals. Patient is making progress toward goals.  Her pre-treatment pain-level was a 2/10 today.  She is now walking unassisted with a gait cycle nearly normal.        Traci Johnson, [place] MPT 03/05/2018, 2:55 PM  [facility] [place], [place], [address] Phone: [phone]   Fax:  [phone]  Name: Traci Johnson MRN: [id number] Date of Birth: 02/06/1951

## 2018-03-07 ENCOUNTER — Encounter: Payer: BLUE CROSS/BLUE SHIELD | Admitting: *Deleted

## 2018-03-12 ENCOUNTER — Ambulatory Visit: Payer: BLUE CROSS/BLUE SHIELD | Admitting: Physical Therapy

## 2018-03-12 ENCOUNTER — Encounter: Payer: Self-pay | Admitting: Physical Therapy

## 2018-03-12 DIAGNOSIS — R600 Localized edema: Secondary | ICD-10-CM

## 2018-03-12 DIAGNOSIS — M25661 Stiffness of right knee, not elsewhere classified: Secondary | ICD-10-CM

## 2018-03-12 DIAGNOSIS — M25561 Pain in right knee: Secondary | ICD-10-CM | POA: Diagnosis not present

## 2018-03-12 NOTE — Therapy (Signed)
[facility] [place], [place], [address] Phone: [phone]   Fax:  [phone]  Physical Therapy Treatment  Patient Details  Name: Traci Johnson MRN: [id number] Date of Birth: [birth year] Referring Provider (PT): Jaynee Eagles MD.   Encounter Date: 03/12/2018  PT End of Session - 03/12/18 1352    Visit Number  11    Number of Visits  12    Date for PT Re-Evaluation  03/26/18    PT Start Time  4825    PT Stop Time  0037    PT Time Calculation (min)  58 min    Activity Tolerance  Patient tolerated treatment well    Behavior During Therapy  [facility] for tasks assessed/performed       Past Medical History:  Diagnosis Date   Allergy    Asthma    Hypertension    Migraines    cluster    Past Surgical History:  Procedure Laterality Date   ABDOMINAL HYSTERECTOMY  1985   CHOLECYSTECTOMY N/A 05/02/2017   Procedure: LAPAROSCOPIC CHOLECYSTECTOMY;  Surgeon: Mickeal Skinner, MD;  [facility]: [facility];  Service: General;  Laterality: N/A;   LEFT HEART CATH AND CORONARY ANGIOGRAPHY N/A 04/30/2017   Procedure: LEFT HEART CATH AND CORONARY ANGIOGRAPHY;  Surgeon: Lorretta Harp, MD;  Location: [place] [facility];  Service: Cardiovascular;  Laterality: N/A;   LIGAMENT REPAIR Right    birth defect    There were no vitals filed for this visit.  Subjective Assessment - 03/12/18 1352    Subjective  Reports jabbing and stabbing pain with standing or walking.    Pertinent History  Right knee surgery in 1974 to "relocate kneecap"; HTN.    Limitations  Walking    How long can you sit comfortably?  None    How long can you stand comfortably?  None    How long can you walk comfortably?  5-7 minutes    Patient Stated Goals  Get out of pain.      Currently in Pain?  Yes    Pain Score  4     Pain Location  Knee    Pain Orientation  Right    Pain Descriptors / Indicators  Stabbing;Discomfort    Pain Type  Surgical pain    Pain  Onset  More than a month ago    Pain Frequency  Intermittent    Aggravating Factors   Standing         [facility] PT Assessment - 03/12/18 0001      Assessment   Medical Diagnosis  Right knee arthroscopic surgery.    Onset Date/Surgical Date  01/23/18    Next MD Visit  04/04/2018      Restrictions   Weight Bearing Restrictions  No      ROM / Strength   AROM / PROM / Strength  AROM      AROM   Overall AROM   Within functional limits for tasks performed    AROM Assessment Site  Knee    Right/Left Knee  Right    Right Knee Extension  0    Right Knee Flexion  125                   [facility] Adult PT Treatment/Exercise - 03/12/18 0001      Exercises   Exercises  Knee/Hip      Knee/Hip Exercises: Aerobic   Nustep  L5 x20 min      Knee/Hip  Exercises: Standing   Terminal Knee Extension  Strengthening;Right;15 reps;Theraband    Theraband Level (Terminal Knee Extension)  Level 3 (Green)    Hip Abduction  AROM;Right;2 sets;10 reps;Knee straight    Forward Step Up  Right;2 sets;10 reps;Hand Hold: 2;Step Height: 6"      Knee/Hip Exercises: Seated   Long Arc Quad  Strengthening;Right;3 sets;10 reps;Weights    Long Arc Quad Weight  3 lbs.      Knee/Hip Exercises: Supine   Straight Leg Raises  AROM;Right;2 sets;10 reps    Straight Leg Raise with External Rotation  AROM;Right;10 reps      Modalities   Modalities  Management consultant  IFC    Electrical Stimulation Parameters  80-150 hz x15 min    Electrical Stimulation Goals  Pain      Vasopneumatic   Number Minutes Vasopneumatic   15 minutes    Vasopnuematic Location   Knee    Vasopneumatic Pressure  Low    Vasopneumatic Temperature   34                  PT Long Term Goals - 03/12/18 1455      PT LONG TERM GOAL #1   Title  Independent with a HEP.    Time  6    Period  Weeks     Status  On-going      PT LONG TERM GOAL #2   Title  Full right active knee extension in order to normalize gait.    Time  6    Period  Weeks    Status  Achieved      PT LONG TERM GOAL #3   Title  Increase right knee strength to a solid 5/5 to provide good stability for accomplishment of functional activities.    Time  6    Period  Weeks    Status  On-going      PT LONG TERM GOAL #4   Title  Perform a reciprocating stair gait with one railing with pain not > 2-3/10.    Time  6    Period  Weeks    Status  On-going      PT LONG TERM GOAL #5   Title  Walk a community distance without assistive device and pain not > 2-3/10.    Time  6    Period  Weeks    Status  Partially Met   able to walk without assistive device yet sharp pain level up to 8/10 at times 03/04/18           Plan - 03/12/18 1409    Clinical Impression Statement  Patient tolerated today's treatment fairly well although she continues to report greater pain with weightbearing activities such as walking and standing. Patient reported initial discomfort with TKE along medial knee which is where patient reports pain with standing and walking. Patient also reported fatigue along proximal quad with supine strengthening exercises. No extensor lag observed during supine exercises today. Normal modalities response noted following removal of the modalities.    Rehab Potential  Excellent    PT Frequency  3x / week    PT Duration  4 weeks    PT Treatment/Interventions  ADLs/Self Care Home Management;Cryotherapy;Electrical Stimulation;Therapeutic activities;Therapeutic exercise;Ultrasound;Moist Heat;Patient/family education;Manual techniques;Vasopneumatic Device    PT Next Visit Plan  Continue with pain free R knee strengthening exercises  with modalities PRN.     Consulted and Agree with Plan of Care  Patient       Patient will benefit from skilled therapeutic intervention in order to improve the following deficits and  impairments:  Pain, Decreased activity tolerance, Decreased strength  Visit Diagnosis: Acute pain of right knee  Stiffness of right knee, not elsewhere classified  Localized edema     Problem List Patient Active Problem List   Diagnosis Date Noted   Orthostatic hypotension 05/03/2017   Syncope due to orthostatic hypotension 05/03/2017   Symptomatic cholelithiasis 05/02/2017   Acute cholecystitis 05/02/2017   Abnormal nuclear stress test    Chest pain 04/27/2017   Hyperglycemia 04/27/2017   Anemia 04/27/2017   Essential hypertension 07/15/2014   Asthma, chronic 07/15/2014   Vitamin D deficiency 07/15/2014    Standley Brooking, PTA 03/12/18 2:57 PM    [facility] [address] [place], [place], [address] Phone: [phone]   Fax:  [phone]  Name: Daphnie Venturini MRN: [id number] Date of Birth: 1951/03/31

## 2018-03-14 ENCOUNTER — Ambulatory Visit: Payer: BLUE CROSS/BLUE SHIELD | Admitting: *Deleted

## 2018-03-14 DIAGNOSIS — R600 Localized edema: Secondary | ICD-10-CM | POA: Diagnosis not present

## 2018-03-14 DIAGNOSIS — M25561 Pain in right knee: Secondary | ICD-10-CM

## 2018-03-14 DIAGNOSIS — M25661 Stiffness of right knee, not elsewhere classified: Secondary | ICD-10-CM

## 2018-03-14 NOTE — Therapy (Signed)
[facility] [place], [place], [address] Phone: [phone]   Fax:  [phone]  Physical Therapy Treatment  Patient Details  Name: Traci Johnson MRN: [id number] Date of Birth: 1950/08/17 Referring Provider (PT): Jaynee Eagles MD.   Encounter Date: 03/14/2018  PT End of Session - 03/14/18 1543    Visit Number  12    Number of Visits  12    Date for PT Re-Evaluation  03/26/18    PT Start Time  1030    PT Stop Time  1121    PT Time Calculation (min)  51 min       Past Medical History:  Diagnosis Date   Allergy    Asthma    Hypertension    Migraines    cluster    Past Surgical History:  Procedure Laterality Date   ABDOMINAL HYSTERECTOMY  1985   CHOLECYSTECTOMY N/A 05/02/2017   Procedure: LAPAROSCOPIC CHOLECYSTECTOMY;  Surgeon: Mickeal Skinner, MD;  Location: [facility];  Service: General;  Laterality: N/A;   LEFT HEART CATH AND CORONARY ANGIOGRAPHY N/A 04/30/2017   Procedure: LEFT HEART CATH AND CORONARY ANGIOGRAPHY;  Surgeon: Lorretta Harp, MD;  Location: [place] [facility];  Service: Cardiovascular;  Laterality: N/A;   LIGAMENT REPAIR Right    birth defect    There were no vitals filed for this visit.  Subjective Assessment - 03/14/18 1048    Subjective  RT knee is doing about the same with jabbing and stabbing. I saw MD last week and said if the pain keeps up he will put a shot in it. 10/10 pain last night.    Pertinent History  Right knee surgery in 1974 to "relocate kneecap"; HTN.    Limitations  Walking    How long can you sit comfortably?  None    How long can you stand comfortably?  None    How long can you walk comfortably?  5-7 minutes    Patient Stated Goals  Get out of pain.      Currently in Pain?  Yes    Pain Score  8     Pain Orientation  Right    Pain Descriptors / Indicators  Shooting;Stabbing    Pain Onset  More than a month ago                       [facility]  Adult PT Treatment/Exercise - 03/14/18 0001      Exercises   Exercises  Knee/Hip      Knee/Hip Exercises: Aerobic   Nustep  L5 x20 min      Modalities   Modalities  Electrical Stimulation;Vasopneumatic      Electrical Stimulation   Electrical Stimulation Location  RT knee IFC x15 mins 80-150hz     Electrical Stimulation Goals  Pain      Ultrasound   Ultrasound Location  RT knee medial Jt line    Ultrasound Parameters  1.2 w/cm2 x 10 mins    Ultrasound Goals  Pain      Vasopneumatic   Number Minutes Vasopneumatic   15 minutes    Vasopnuematic Location   Knee    Vasopneumatic Pressure  Low    Vasopneumatic Temperature   34                  PT Long Term Goals - 03/12/18 1455      PT LONG TERM GOAL #1   Title  Independent with a  HEP.    Time  6    Period  Weeks    Status  On-going      PT LONG TERM GOAL #2   Title  Full right active knee extension in order to normalize gait.    Time  6    Period  Weeks    Status  Achieved      PT LONG TERM GOAL #3   Title  Increase right knee strength to a solid 5/5 to provide good stability for accomplishment of functional activities.    Time  6    Period  Weeks    Status  On-going      PT LONG TERM GOAL #4   Title  Perform a reciprocating stair gait with one railing with pain not > 2-3/10.    Time  6    Period  Weeks    Status  On-going      PT LONG TERM GOAL #5   Title  Walk a community distance without assistive device and pain not > 2-3/10.    Time  6    Period  Weeks    Status  Partially Met   able to walk without assistive device yet sharp pain level up to 8/10 at times 03/04/18             Patient will benefit from skilled therapeutic intervention in order to improve the following deficits and impairments:     Visit Diagnosis: Acute pain of right knee  Stiffness of right knee, not elsewhere classified  Localized edema     Problem List Patient Active Problem List   Diagnosis Date Noted    Orthostatic hypotension 05/03/2017   Syncope due to orthostatic hypotension 05/03/2017   Symptomatic cholelithiasis 05/02/2017   Acute cholecystitis 05/02/2017   Abnormal nuclear stress test    Chest pain 04/27/2017   Hyperglycemia 04/27/2017   Anemia 04/27/2017   Essential hypertension 07/15/2014   Asthma, chronic 07/15/2014   Vitamin D deficiency 07/15/2014    Lakrista Scaduto,CHRIS, PTA 03/14/2018, 3:48 PM  [facility] [address] [place], [place], [address] Phone: [phone]   Fax:  [phone]  Name: Traci Johnson MRN: [id number] Date of Birth: 1951-01-20

## 2018-03-15 DIAGNOSIS — M25561 Pain in right knee: Secondary | ICD-10-CM | POA: Diagnosis not present

## 2018-03-15 DIAGNOSIS — Z9889 Other specified postprocedural states: Secondary | ICD-10-CM | POA: Diagnosis not present

## 2018-03-18 ENCOUNTER — Ambulatory Visit: Payer: BLUE CROSS/BLUE SHIELD | Admitting: Physical Therapy

## 2018-03-18 ENCOUNTER — Encounter: Payer: Self-pay | Admitting: Physical Therapy

## 2018-03-18 DIAGNOSIS — M25561 Pain in right knee: Secondary | ICD-10-CM | POA: Diagnosis not present

## 2018-03-18 DIAGNOSIS — R600 Localized edema: Secondary | ICD-10-CM | POA: Diagnosis not present

## 2018-03-18 DIAGNOSIS — M25661 Stiffness of right knee, not elsewhere classified: Secondary | ICD-10-CM | POA: Diagnosis not present

## 2018-03-18 NOTE — Therapy (Signed)
[facility] [place], [place], [address] Phone: [phone]   Fax:  [phone]  Physical Therapy Treatment  Patient Details  Name: Traci Johnson MRN: [id number] Date of Birth: 1950/08/24 Referring Provider (PT): Jaynee Eagles MD.   Encounter Date: 03/18/2018  PT End of Session - 03/18/18 1355    Visit Number  13    Number of Visits  18    Date for PT Re-Evaluation  04/09/18    PT Start Time  3299    PT Stop Time  2426    PT Time Calculation (min)  49 min    Activity Tolerance  Patient tolerated treatment well    Behavior During Therapy  [facility] for tasks assessed/performed       Past Medical History:  Diagnosis Date   Allergy    Asthma    Hypertension    Migraines    cluster    Past Surgical History:  Procedure Laterality Date   ABDOMINAL HYSTERECTOMY  1985   CHOLECYSTECTOMY N/A 05/02/2017   Procedure: LAPAROSCOPIC CHOLECYSTECTOMY;  Surgeon: Mickeal Skinner, MD;  Location: [facility];  Service: General;  Laterality: N/A;   LEFT HEART CATH AND CORONARY ANGIOGRAPHY N/A 04/30/2017   Procedure: LEFT HEART CATH AND CORONARY ANGIOGRAPHY;  Surgeon: Lorretta Harp, MD;  Location: [place] [facility];  Service: Cardiovascular;  Laterality: N/A;   LIGAMENT REPAIR Right    birth defect    There were no vitals filed for this visit.  Subjective Assessment - 03/18/18 1353    Subjective  Reports going to have fluid drained off her knee and for a cortisone shot on friday. Reports that if pain continues per MD that he would send her for another MRI and maybe possible surgery again. Reports ache in knee last night. Reports that sharp, stabbing pain is more in sitting or lying down.    Pertinent History  Right knee surgery in 1974 to "relocate kneecap"; HTN.    Limitations  Walking    How long can you sit comfortably?  None    How long can you stand comfortably?  None    How long can you walk comfortably?  5-7 minutes     Patient Stated Goals  Get out of pain.      Currently in Pain?  Yes    Pain Score  2     Pain Location  Knee    Pain Orientation  Right    Pain Descriptors / Indicators  Discomfort;Tightness    Pain Type  Surgical pain    Pain Onset  More than a month ago         [facility] PT Assessment - 03/18/18 0001      Assessment   Medical Diagnosis  Right knee arthroscopic surgery.    Onset Date/Surgical Date  01/23/18    Next MD Visit  04/04/2018      Restrictions   Weight Bearing Restrictions  No                   [facility] Adult PT Treatment/Exercise - 03/18/18 0001      Knee/Hip Exercises: Aerobic   Nustep  L5 x20 min      Knee/Hip Exercises: Supine   Short Arc Quad Sets  AROM;Right;3 sets;10 reps    Heel Slides  AROM;Right;15 reps    Hip Adduction Isometric  Strengthening;15 reps    Straight Leg Raises  AROM;Right;2 sets;10 reps      Modalities  Modalities  Management consultant  IFC    Electrical Stimulation Parameters  80-150 hz x15 min    Electrical Stimulation Goals  Pain;Edema      Vasopneumatic   Number Minutes Vasopneumatic   15 minutes    Vasopnuematic Location   Knee    Vasopneumatic Pressure  Low    Vasopneumatic Temperature   59                  PT Long Term Goals - 03/12/18 1455      PT LONG TERM GOAL #1   Title  Independent with a HEP.    Time  6    Period  Weeks    Status  On-going      PT LONG TERM GOAL #2   Title  Full right active knee extension in order to normalize gait.    Time  6    Period  Weeks    Status  Achieved      PT LONG TERM GOAL #3   Title  Increase right knee strength to a solid 5/5 to provide good stability for accomplishment of functional activities.    Time  6    Period  Weeks    Status  On-going      PT LONG TERM GOAL #4   Title  Perform a reciprocating stair gait with one railing  with pain not > 2-3/10.    Time  6    Period  Weeks    Status  On-going      PT LONG TERM GOAL #5   Title  Walk a community distance without assistive device and pain not > 2-3/10.    Time  6    Period  Weeks    Status  Partially Met   able to walk without assistive device yet sharp pain level up to 8/10 at times 03/04/18           Plan - 03/18/18 1512    Clinical Impression Statement  Patient presented in clinic today with continued R medial knee pain. Increased edema noted in R knee primarily along medial knee joint. Patient experiences greater sharp, shooting pain when at rest in sitting or supine. Patient experienced RLE feeling "heavier" and tighter" during exercises. No R extensor lag noted today during SLR. Normal modalities response noted following removal of the modalities.    Rehab Potential  Excellent    PT Frequency  3x / week    PT Duration  4 weeks    PT Treatment/Interventions  ADLs/Self Care Home Management;Cryotherapy;Electrical Stimulation;Therapeutic activities;Therapeutic exercise;Ultrasound;Moist Heat;Patient/family education;Manual techniques;Vasopneumatic Device    PT Next Visit Plan  Continue with pain free R knee strengthening exercises with modalities PRN.     Consulted and Agree with Plan of Care  Patient       Patient will benefit from skilled therapeutic intervention in order to improve the following deficits and impairments:  Pain, Decreased activity tolerance, Decreased strength  Visit Diagnosis: Acute pain of right knee  Stiffness of right knee, not elsewhere classified  Localized edema     Problem List Patient Active Problem List   Diagnosis Date Noted   Orthostatic hypotension 05/03/2017   Syncope due to orthostatic hypotension 05/03/2017   Symptomatic cholelithiasis 05/02/2017   Acute cholecystitis 05/02/2017   Abnormal nuclear stress test    Chest pain 04/27/2017   Hyperglycemia  04/27/2017   Anemia 04/27/2017   Essential  hypertension 07/15/2014   Asthma, chronic 07/15/2014   Vitamin D deficiency 07/15/2014    Standley Brooking, PTA 03/18/2018, 3:17 PM  [facility] [address] [place], [place], [address] Phone: [phone]   Fax:  [phone]  Name: Traci Johnson MRN: [id number] Date of Birth: 04/02/1951

## 2018-03-20 ENCOUNTER — Encounter: Payer: Self-pay | Admitting: Physical Therapy

## 2018-03-20 ENCOUNTER — Ambulatory Visit: Payer: BLUE CROSS/BLUE SHIELD | Admitting: Physical Therapy

## 2018-03-20 DIAGNOSIS — R600 Localized edema: Secondary | ICD-10-CM | POA: Diagnosis not present

## 2018-03-20 DIAGNOSIS — M25561 Pain in right knee: Secondary | ICD-10-CM

## 2018-03-20 DIAGNOSIS — M25661 Stiffness of right knee, not elsewhere classified: Secondary | ICD-10-CM

## 2018-03-20 NOTE — Therapy (Signed)
[facility] [place], [place], [address] Phone: [phone]   Fax:  [phone]  Physical Therapy Treatment  Patient Details  Name: Traci Johnson MRN: [id number] Date of Birth: [birth year] Referring Provider (PT): Jaynee Eagles MD.   Encounter Date: 03/20/2018  PT End of Session - 03/20/18 1420    Visit Number  14    Number of Visits  18    Date for PT Re-Evaluation  04/09/18    PT Start Time  0998    PT Stop Time  1430    PT Time Calculation (min)  48 min    Activity Tolerance  Patient tolerated treatment well    Behavior During Therapy  [facility] for tasks assessed/performed       Past Medical History:  Diagnosis Date   Allergy    Asthma    Hypertension    Migraines    cluster    Past Surgical History:  Procedure Laterality Date   ABDOMINAL HYSTERECTOMY  1985   CHOLECYSTECTOMY N/A 05/02/2017   Procedure: LAPAROSCOPIC CHOLECYSTECTOMY;  Surgeon: Mickeal Skinner, MD;  Location: [facility];  Service: General;  Laterality: N/A;   LEFT HEART CATH AND CORONARY ANGIOGRAPHY N/A 04/30/2017   Procedure: LEFT HEART CATH AND CORONARY ANGIOGRAPHY;  Surgeon: Lorretta Harp, MD;  Location: [place] [facility];  Service: Cardiovascular;  Laterality: N/A;   LIGAMENT REPAIR Right    birth defect    There were no vitals filed for this visit.  Subjective Assessment - 03/20/18 1348    Subjective  Patient arrived with antalgic gait and hurting bad    Pertinent History  Right knee surgery in 1974 to "relocate kneecap"; HTN.    Limitations  Walking    How long can you sit comfortably?  None    How long can you stand comfortably?  None    How long can you walk comfortably?  5-7 minutes    Patient Stated Goals  Get out of pain.      Currently in Pain?  Yes    Pain Score  6     Pain Location  Knee    Pain Orientation  Right    Pain Descriptors / Indicators  Discomfort;Tightness;Shooting;Sharp    Pain Onset  More than a  month ago    Pain Frequency  Intermittent    Aggravating Factors   any standing or walking    Pain Relieving Factors  sitting / rest                       [facility] Adult PT Treatment/Exercise - 03/20/18 0001      Electrical Stimulation   Electrical Stimulation Location  R knee    Electrical Stimulation Action  IFC    Electrical Stimulation Parameters  80-150hz  x23mn    Electrical Stimulation Goals  Pain;Edema      Ultrasound   Ultrasound Location  right medial knee    Ultrasound Parameters  1.2w/cm/50%/131m x1037m   Ultrasound Goals  Pain      Vasopneumatic   Number Minutes Vasopneumatic   15 minutes    Vasopnuematic Location   Knee    Vasopneumatic Pressure  Low      Manual Therapy   Manual Therapy  Soft tissue mobilization    Manual therapy comments  manual STW to post knee/medial and lateral knee to reduce pain and tone  PT Long Term Goals - 03/12/18 1455      PT LONG TERM GOAL #1   Title  Independent with a HEP.    Time  6    Period  Weeks    Status  On-going      PT LONG TERM GOAL #2   Title  Full right active knee extension in order to normalize gait.    Time  6    Period  Weeks    Status  Achieved      PT LONG TERM GOAL #3   Title  Increase right knee strength to a solid 5/5 to provide good stability for accomplishment of functional activities.    Time  6    Period  Weeks    Status  On-going      PT LONG TERM GOAL #4   Title  Perform a reciprocating stair gait with one railing with pain not > 2-3/10.    Time  6    Period  Weeks    Status  On-going      PT LONG TERM GOAL #5   Title  Walk a community distance without assistive device and pain not > 2-3/10.    Time  6    Period  Weeks    Status  Partially Met   able to walk without assistive device yet sharp pain level up to 8/10 at times 03/04/18           Plan - 03/20/18 1422    Clinical Impression Statement  Patient tolerated treatment fair today.  Patient arrived with increased pain for unknown reason. Today focused on modalities and manual STW to help reduce pain. Patient has increased pain with any standing and walking. Patient goals ongoing at this time. Patient is to schedule another MRI.     Rehab Potential  Excellent    PT Frequency  3x / week    PT Duration  4 weeks    PT Treatment/Interventions  ADLs/Self Care Home Management;Cryotherapy;Electrical Stimulation;Therapeutic activities;Therapeutic exercise;Ultrasound;Moist Heat;Patient/family education;Manual techniques;Vasopneumatic Device    PT Next Visit Plan  cont with conservative treatment    Consulted and Agree with Plan of Care  Patient       Patient will benefit from skilled therapeutic intervention in order to improve the following deficits and impairments:  Pain, Decreased activity tolerance, Decreased strength  Visit Diagnosis: Acute pain of right knee  Stiffness of right knee, not elsewhere classified  Localized edema     Problem List Patient Active Problem List   Diagnosis Date Noted   Orthostatic hypotension 05/03/2017   Syncope due to orthostatic hypotension 05/03/2017   Symptomatic cholelithiasis 05/02/2017   Acute cholecystitis 05/02/2017   Abnormal nuclear stress test    Chest pain 04/27/2017   Hyperglycemia 04/27/2017   Anemia 04/27/2017   Essential hypertension 07/15/2014   Asthma, chronic 07/15/2014   Vitamin D deficiency 07/15/2014    Danamarie Minami P, PTA 03/20/2018, 2:33 PM  [facility] [address] [place], [place], [address] Phone: [phone]   Fax:  [phone]  Name: Traci Johnson MRN: [id number] Date of Birth: 08/14/1950

## 2018-03-21 ENCOUNTER — Encounter: Payer: Self-pay | Admitting: Physical Therapy

## 2018-03-21 ENCOUNTER — Ambulatory Visit: Payer: BLUE CROSS/BLUE SHIELD | Admitting: Physical Therapy

## 2018-03-21 DIAGNOSIS — M25661 Stiffness of right knee, not elsewhere classified: Secondary | ICD-10-CM | POA: Diagnosis not present

## 2018-03-21 DIAGNOSIS — M25561 Pain in right knee: Secondary | ICD-10-CM | POA: Diagnosis not present

## 2018-03-21 DIAGNOSIS — R600 Localized edema: Secondary | ICD-10-CM | POA: Diagnosis not present

## 2018-03-21 NOTE — Therapy (Signed)
[facility] [place], [place], [address] Phone: [phone]   Fax:  [phone]  Physical Therapy Treatment  Patient Details  Name: Traci Johnson MRN: [id number] Date of Birth: Sep 17, 1950 Referring Provider (PT): Jaynee Eagles MD.   Encounter Date: 03/21/2018  PT End of Session - 03/21/18 1404    Visit Number  15    Number of Visits  18    Date for PT Re-Evaluation  04/09/18    PT Start Time  8144    PT Stop Time  8185    PT Time Calculation (min)  49 min    Activity Tolerance  Patient tolerated treatment well    Behavior During Therapy  [facility] for tasks assessed/performed       Past Medical History:  Diagnosis Date   Allergy    Asthma    Hypertension    Migraines    cluster    Past Surgical History:  Procedure Laterality Date   ABDOMINAL HYSTERECTOMY  1985   CHOLECYSTECTOMY N/A 05/02/2017   Procedure: LAPAROSCOPIC CHOLECYSTECTOMY;  Surgeon: Mickeal Skinner, MD;  Location: [facility];  Service: General;  Laterality: N/A;   LEFT HEART CATH AND CORONARY ANGIOGRAPHY N/A 04/30/2017   Procedure: LEFT HEART CATH AND CORONARY ANGIOGRAPHY;  Surgeon: Lorretta Harp, MD;  Location: [place] [facility];  Service: Cardiovascular;  Laterality: N/A;   LIGAMENT REPAIR Right    birth defect    There were no vitals filed for this visit.  Subjective Assessment - 03/21/18 1347    Subjective  Reports that she moved her head a certain way yesterday and has been able to bend her knee better. Leaves     Pertinent History  Right knee surgery in 1974 to "relocate kneecap"; HTN.    Limitations  Walking    How long can you sit comfortably?  None    How long can you stand comfortably?  None    How long can you walk comfortably?  5-7 minutes    Patient Stated Goals  Get out of pain.      Currently in Pain?  Yes    Pain Score  2     Pain Location  Knee    Pain Orientation  Right    Pain Descriptors / Indicators  Other  (Comment);Discomfort   Stiffness   Pain Type  Surgical pain    Pain Onset  More than a month ago         [facility] PT Assessment - 03/21/18 0001      Assessment   Medical Diagnosis  Right knee arthroscopic surgery.    Onset Date/Surgical Date  01/23/18    Next MD Visit  04/04/2018      Restrictions   Weight Bearing Restrictions  No                   [facility] Adult PT Treatment/Exercise - 03/21/18 0001      Knee/Hip Exercises: Aerobic   Nustep  L5 x20 min      Modalities   Modalities  Electrical Stimulation;Vasopneumatic      Electrical Stimulation   Electrical Stimulation Location  R knee    Electrical Stimulation Action  IFC    Electrical Stimulation Parameters  80-150 hz x15 min    Electrical Stimulation Goals  Pain;Edema      Vasopneumatic   Number Minutes Vasopneumatic   15 minutes    Vasopnuematic Location   Knee    Vasopneumatic Pressure  Low    Vasopneumatic Temperature   52      Manual Therapy   Manual Therapy  Soft tissue mobilization    Soft tissue mobilization  STW to R ITB, quad, hip adductors to reduce tightness and pain                  PT Long Term Goals - 03/12/18 1455      PT LONG TERM GOAL #1   Title  Independent with a HEP.    Time  6    Period  Weeks    Status  On-going      PT LONG TERM GOAL #2   Title  Full right active knee extension in order to normalize gait.    Time  6    Period  Weeks    Status  Achieved      PT LONG TERM GOAL #3   Title  Increase right knee strength to a solid 5/5 to provide good stability for accomplishment of functional activities.    Time  6    Period  Weeks    Status  On-going      PT LONG TERM GOAL #4   Title  Perform a reciprocating stair gait with one railing with pain not > 2-3/10.    Time  6    Period  Weeks    Status  On-going      PT LONG TERM GOAL #5   Title  Walk a community distance without assistive device and pain not > 2-3/10.    Time  6    Period  Weeks    Status   Partially Met   able to walk without assistive device yet sharp pain level up to 8/10 at times 03/04/18           Plan - 03/21/18 1426    Clinical Impression Statement  Patient tolerated today's treatment better as she has had less pain overall. Patient able to tolerate Nustep without any report of increased pain. Increased tightness palpable in distal ITB, vastus lateralis as well as distal quad. Normal modalities response noted following removal of the modalities. No AD utilized today during ambulation and more knee flexion noted with ambulation.    Rehab Potential  Excellent    PT Frequency  3x / week    PT Duration  4 weeks    PT Treatment/Interventions  ADLs/Self Care Home Management;Cryotherapy;Electrical Stimulation;Therapeutic activities;Therapeutic exercise;Ultrasound;Moist Heat;Patient/family education;Manual techniques;Vasopneumatic Device    PT Next Visit Plan  cont with conservative treatment    Consulted and Agree with Plan of Care  Patient       Patient will benefit from skilled therapeutic intervention in order to improve the following deficits and impairments:  Pain, Decreased activity tolerance, Decreased strength  Visit Diagnosis: Acute pain of right knee  Stiffness of right knee, not elsewhere classified  Localized edema     Problem List Patient Active Problem List   Diagnosis Date Noted   Orthostatic hypotension 05/03/2017   Syncope due to orthostatic hypotension 05/03/2017   Symptomatic cholelithiasis 05/02/2017   Acute cholecystitis 05/02/2017   Abnormal nuclear stress test    Chest pain 04/27/2017   Hyperglycemia 04/27/2017   Anemia 04/27/2017   Essential hypertension 07/15/2014   Asthma, chronic 07/15/2014   Vitamin D deficiency 07/15/2014    Standley Brooking, PTA 03/21/2018, 2:39 PM  [facility] [address] [place], [place], [address] Phone: [phone]   Fax:  [phone]  Name:  Traci Johnson MRN: [id number] Date of Birth: 15-Dec-1950

## 2018-03-25 ENCOUNTER — Encounter: Payer: Self-pay | Admitting: Adult Health

## 2018-03-25 ENCOUNTER — Other Ambulatory Visit: Payer: Self-pay

## 2018-03-25 ENCOUNTER — Ambulatory Visit (INDEPENDENT_AMBULATORY_CARE_PROVIDER_SITE_OTHER): Payer: BLUE CROSS/BLUE SHIELD | Admitting: Adult Health

## 2018-03-25 ENCOUNTER — Ambulatory Visit: Payer: BLUE CROSS/BLUE SHIELD | Admitting: Physical Therapy

## 2018-03-25 ENCOUNTER — Encounter: Payer: Self-pay | Admitting: Physical Therapy

## 2018-03-25 VITALS — BP 142/78 | HR 100 | Ht 63.0 in | Wt 207.0 lb

## 2018-03-25 DIAGNOSIS — Z79899 Other long term (current) drug therapy: Secondary | ICD-10-CM | POA: Insufficient documentation

## 2018-03-25 DIAGNOSIS — M25661 Stiffness of right knee, not elsewhere classified: Secondary | ICD-10-CM

## 2018-03-25 DIAGNOSIS — R600 Localized edema: Secondary | ICD-10-CM

## 2018-03-25 DIAGNOSIS — Z1212 Encounter for screening for malignant neoplasm of rectum: Secondary | ICD-10-CM | POA: Diagnosis not present

## 2018-03-25 DIAGNOSIS — Z01419 Encounter for gynecological examination (general) (routine) without abnormal findings: Secondary | ICD-10-CM | POA: Diagnosis not present

## 2018-03-25 DIAGNOSIS — Z1211 Encounter for screening for malignant neoplasm of colon: Secondary | ICD-10-CM | POA: Diagnosis not present

## 2018-03-25 DIAGNOSIS — Z79818 Long term (current) use of other agents affecting estrogen receptors and estrogen levels: Secondary | ICD-10-CM

## 2018-03-25 DIAGNOSIS — M25561 Pain in right knee: Secondary | ICD-10-CM | POA: Diagnosis not present

## 2018-03-25 DIAGNOSIS — R195 Other fecal abnormalities: Secondary | ICD-10-CM

## 2018-03-25 LAB — HEMOCCULT GUIAC POC 1CARD (OFFICE): Fecal Occult Blood, POC: POSITIVE — AB

## 2018-03-25 MED ORDER — ESTRADIOL 1 MG PO TABS: 1.0000 mg | ORAL_TABLET | Freq: Every day | ORAL | 4 refills | Status: DC

## 2018-03-25 NOTE — Progress Notes (Signed)
Patient ID: Traci Johnson, female   DOB: 08-06-1950, 67 y.o.   MRN: [id number] History of Present Illness: Traci Johnson is a 67 year old white female, married, sp hysterectomy(endometriosis) in for well woman gyn exam. She is on oral estrogen and has tried to stop and just can not. She used to see Dr Edwyna Shell in [facility] till he retired.  PCP is Dr Nevada Crane.   Current Medications, Allergies, Past Medical History, Past Surgical History, Family History and Social History were reviewed in [employer] record.     Review of Systems: Patient denies any headaches, hearing loss, fatigue, blurred vision, shortness of breath, chest pain, abdominal pain, problems with bowel movements(has IBS at times), urination, or intercourse. No mood swings.Pain in right knee    Physical Exam:BP (!) 142/78 (BP Location: Right Arm, Patient Position: Sitting, Cuff Size: Normal)    Pulse 100    Ht 5\' 3"  (1.6 m)    Wt 207 lb (93.9 kg)    BMI 36.67 kg/m  General:  Well developed, well nourished, no acute distress,wlaking with cane Skin:  Warm and dry,numberous AKs Neck:  Midline trachea, normal thyroid, good ROM, no lymphadenopathy,no carotid bruits heard Lungs; Clear to auscultation bilaterally Breast:  No dominant palpable mass, retraction, or nipple discharge Cardiovascular: Regular rate and rhythm Abdomen:  Soft, non tender, no hepatosplenomegaly Pelvic:  External genitalia is normal in appearance, +angiokeratomas.  The vagina is pale with loss of moisture and rugae. Urethra has no lesions or masses. The cervix and uterus are absent.  No adnexal masses or tenderness noted.Bladder is non tender, no masses felt. Rectal: Good sphincter tone, no polyps, + hemorrhoids felt.  Hemoccult positive.(had BM this morning).  Extremities/musculoskeletal:  No clubbing or cyanosis, has spider veins and right knee swollen, had recent surgery  Psych:  No mood changes, alert and cooperative,seems happy PHQ 2 score  0. Examination chaperoned by Shela Nevin RN.  Impression: 1. Encounter for well woman exam with routine gynecological exam   2. Screening for colorectal cancer   3. Fecal occult blood test positive   4. Current use of estrogen therapy       Plan: Meds ordered this encounter  Medications   estradiol (ESTRACE) 1 MG tablet    Sig: Take 1 tablet (1 mg total) by mouth daily.    Dispense:  90 tablet    Refill:  4    Order Specific Question:   Supervising Provider    Answer:   Tania Ade H [[id number]]  3 hemoccult cards sent home, she says had normal colonoscopy in past  Physical in 1 year Mammogram yearly Labs with PCP

## 2018-03-25 NOTE — Therapy (Signed)
[facility] [place], [place], [address] Phone: [phone]   Fax:  [phone]  Physical Therapy Treatment  Patient Details  Name: Traci Johnson MRN: [id number] Date of Birth: 07-Jan-1951 Referring Provider (PT): Jaynee Eagles MD.   Encounter Date: 03/25/2018  PT End of Session - 03/25/18 1352    Visit Number  16    Number of Visits  18    Date for PT Re-Evaluation  04/09/18    PT Start Time  1350    PT Stop Time  1436    PT Time Calculation (min)  46 min    Activity Tolerance  Patient tolerated treatment well    Behavior During Therapy  [facility] for tasks assessed/performed       Past Medical History:  Diagnosis Date   Allergy    Asthma    Hypertension    Migraines    cluster    Past Surgical History:  Procedure Laterality Date   ABDOMINAL HYSTERECTOMY  1985   CHOLECYSTECTOMY N/A 05/02/2017   Procedure: LAPAROSCOPIC CHOLECYSTECTOMY;  Surgeon: Mickeal Skinner, MD;  Location: [facility];  Service: General;  Laterality: N/A;   LEFT HEART CATH AND CORONARY ANGIOGRAPHY N/A 04/30/2017   Procedure: LEFT HEART CATH AND CORONARY ANGIOGRAPHY;  Surgeon: Lorretta Harp, MD;  Location: [place] [facility];  Service: Cardiovascular;  Laterality: N/A;   LIGAMENT REPAIR Right    birth defect    There were no vitals filed for this visit.  Subjective Assessment - 03/25/18 1351    Subjective  Reports that she was going up and down stairs on her own this weekend nonreciprically. Reports that at rest is when she has the sharp pain but not as bad as they used to be. Did not have to ice as much this weekend. Has an MRI scheduled for 03/28/2018.    Pertinent History  Right knee surgery in 1974 to "relocate kneecap"; HTN.    Limitations  Walking    How long can you sit comfortably?  None    How long can you stand comfortably?  None    How long can you walk comfortably?  5-7 minutes    Patient Stated Goals  Get out of pain.       Currently in Pain?  No/denies         [facility] PT Assessment - 03/25/18 0001      Assessment   Medical Diagnosis  Right knee arthroscopic surgery.    Onset Date/Surgical Date  01/23/18    Next MD Visit  04/04/2018      Restrictions   Weight Bearing Restrictions  No                   [facility] Adult PT Treatment/Exercise - 03/25/18 0001      Exercises   Exercises  Knee/Hip      Knee/Hip Exercises: Aerobic   Stationary Bike  L1, seat 2 x15 min      Knee/Hip Exercises: Standing   Forward Lunges  Right;15 reps    Forward Step Up  Right;20 reps;Hand Hold: 2;Step Height: 6"    Rocker Board  3 minutes      Knee/Hip Exercises: Seated   Long Arc Quad  Strengthening;Right;3 sets;10 reps;Weights    Long Arc Quad Weight  4 lbs.    Long [employer] Limitations   with ball squeeze      Knee/Hip Exercises: Supine   Straight Leg Raises  AROM;Right;15 reps  Modalities   Modalities  Management consultant  IFC    Electrical Stimulation Parameters  80-150 hz x15 min    Electrical Stimulation Goals  Pain;Edema      Vasopneumatic   Number Minutes Vasopneumatic   15 minutes    Vasopnuematic Location   Knee    Vasopneumatic Pressure  Low    Vasopneumatic Temperature   34                  PT Long Term Goals - 03/12/18 1455      PT LONG TERM GOAL #1   Title  Independent with a HEP.    Time  6    Period  Weeks    Status  On-going      PT LONG TERM GOAL #2   Title  Full right active knee extension in order to normalize gait.    Time  6    Period  Weeks    Status  Achieved      PT LONG TERM GOAL #3   Title  Increase right knee strength to a solid 5/5 to provide good stability for accomplishment of functional activities.    Time  6    Period  Weeks    Status  On-going      PT LONG TERM GOAL #4   Title  Perform a reciprocating stair  gait with one railing with pain not > 2-3/10.    Time  6    Period  Weeks    Status  On-going      PT LONG TERM GOAL #5   Title  Walk a community distance without assistive device and pain not > 2-3/10.    Time  6    Period  Weeks    Status  Partially Met   able to walk without assistive device yet sharp pain level up to 8/10 at times 03/04/18           Plan - 03/25/18 1454    Clinical Impression Statement  Patient tolerated today's treatment much better today as she only reported intermittant discomfort such as during ambulation. Patient able to complete exercises as directed with discomfort reported with lunges, LAQ and SLR. No palpable tenderness or pain reported in distal ITB, vastus lateralis or quad. Normal modalities response noted following removal of the modalities.    Rehab Potential  Excellent    PT Frequency  3x / week    PT Duration  4 weeks    PT Treatment/Interventions  ADLs/Self Care Home Management;Cryotherapy;Electrical Stimulation;Therapeutic activities;Therapeutic exercise;Ultrasound;Moist Heat;Patient/family education;Manual techniques;Vasopneumatic Device    PT Next Visit Plan  Continue as symptoms dictate.    Consulted and Agree with Plan of Care  Patient       Patient will benefit from skilled therapeutic intervention in order to improve the following deficits and impairments:  Pain, Decreased activity tolerance, Decreased strength  Visit Diagnosis: Acute pain of right knee  Stiffness of right knee, not elsewhere classified  Localized edema     Problem List Patient Active Problem List   Diagnosis Date Noted   Encounter for well woman exam with routine gynecological exam 03/25/2018   Screening for colorectal cancer 03/25/2018   Fecal occult blood test positive 03/25/2018   Current use of estrogen therapy 03/25/2018   Orthostatic hypotension 05/03/2017   Syncope due to orthostatic hypotension 05/03/2017  Symptomatic cholelithiasis  05/02/2017   Acute cholecystitis 05/02/2017   Abnormal nuclear stress test    Chest pain 04/27/2017   Hyperglycemia 04/27/2017   Anemia 04/27/2017   Essential hypertension 07/15/2014   Asthma, chronic 07/15/2014   Vitamin D deficiency 07/15/2014    Standley Brooking, PTA 03/25/2018, 2:57 PM  [facility] [address] [place], [place], [address] Phone: [phone]   Fax:  [phone]  Name: Juaquina Machnik MRN: [id number] Date of Birth: 1951/03/03

## 2018-03-27 ENCOUNTER — Encounter: Payer: Self-pay | Admitting: Physical Therapy

## 2018-03-27 ENCOUNTER — Ambulatory Visit: Payer: BLUE CROSS/BLUE SHIELD | Admitting: Physical Therapy

## 2018-03-27 DIAGNOSIS — R600 Localized edema: Secondary | ICD-10-CM | POA: Diagnosis not present

## 2018-03-27 DIAGNOSIS — M25661 Stiffness of right knee, not elsewhere classified: Secondary | ICD-10-CM

## 2018-03-27 DIAGNOSIS — M25561 Pain in right knee: Secondary | ICD-10-CM

## 2018-03-27 NOTE — Therapy (Signed)
[facility] [address] [place], [place], [address] Phone: [phone]   Fax:  [phone]  Physical Therapy Treatment  Patient Details  Name: Traci Johnson MRN: [id number] Date of Birth: Sep 23, 1950 Referring Provider (PT): Darnell Level MD.   Encounter Date: 03/27/2018  PT End of Session - 03/27/18 1354    Visit Number  17    Number of Visits  18    Date for PT Re-Evaluation  04/09/18    PT Start Time  1347    PT Stop Time  1431    PT Time Calculation (min)  44 min    Activity Tolerance  Patient tolerated treatment well;Patient limited by pain    Behavior During Therapy  [facility] for tasks assessed/performed       Past Medical History:  Diagnosis Date  . Allergy   . Asthma   . Hypertension   . Migraines    cluster    Past Surgical History:  Procedure Laterality Date  . ABDOMINAL HYSTERECTOMY  1985  . CHOLECYSTECTOMY N/A 05/02/2017   Procedure: LAPAROSCOPIC CHOLECYSTECTOMY;  Surgeon: Kinsinger, De Blanch, MD;  Location: [facility];  Service: General;  Laterality: N/A;  . LEFT HEART CATH AND CORONARY ANGIOGRAPHY N/A 04/30/2017   Procedure: LEFT HEART CATH AND CORONARY ANGIOGRAPHY;  Surgeon: Runell Gess, MD;  [facility]: [facility];  Service: Cardiovascular;  Laterality: N/A;  . LIGAMENT REPAIR Right    birth defect    There were no vitals filed for this visit.  Subjective Assessment - 03/27/18 1349    Subjective  Patient reported ongoing discomfort, MRI tomorrow    Pertinent History  Right knee surgery in 1974 to "relocate kneecap"; HTN.    Limitations  Walking    How long can you sit comfortably?  None    How long can you stand comfortably?  None    How long can you walk comfortably?  5-7 minutes    Patient Stated Goals  Get out of pain.      Currently in Pain?  Yes    Pain Score  1     Pain Location  Knee    Pain Orientation  Right    Pain Descriptors / Indicators  Tightness    Pain Type  Surgical pain     Pain Onset  More than a month ago    Pain Frequency  Intermittent    Aggravating Factors   prolong walking    Pain Relieving Factors  rest                       [facility] Adult PT Treatment/Exercise - 03/27/18 0001      Knee/Hip Exercises: Aerobic   Nustep  x51min L5 UE/LE activity      Knee/Hip Exercises: Seated   Long Arc Quad  Strengthening;Right;3 sets;10 reps;Weights    Long Arc Quad Weight  4 lbs.      Knee/Hip Exercises: Supine   Short Arc Quad Sets  AROM;Right;3 sets;10 reps    Short Arc [employer] Limitations  with ball squeeze 4# 3x10    Other Supine Knee/Hip Exercises  hip abd with red t-band x30       Programme researcher, broadcasting/film/video Location  R knee    Electrical Stimulation Action  IFC    Electrical Stimulation Parameters  80-150hz  x59min    Electrical Stimulation Goals  Pain;Edema      Vasopneumatic   Number Minutes Vasopneumatic  15 minutes    Vasopnuematic Location   Knee    Vasopneumatic Pressure  Low                  PT Long Term Goals - 03/27/18 1441      PT LONG TERM GOAL #1   Title  Independent with a HEP.    Time  6    Period  Weeks    Status  On-going      PT LONG TERM GOAL #2   Title  Full right active knee extension in order to normalize gait.    Time  6    Period  Weeks    Status  Achieved      PT LONG TERM GOAL #3   Title  Increase right knee strength to a solid 5/5 to provide good stability for accomplishment of functional activities.    Time  6    Period  Weeks    Status  Not Met      PT LONG TERM GOAL #4   Title  Perform a reciprocating stair gait with one railing with pain not > 2-3/10.    Time  6    Period  Weeks    Status  On-going   3+/10 pain 03/27/18     PT LONG TERM GOAL #5   Title  Walk a community distance without assistive device and pain not > 2-3/10.    Time  6    Period  Weeks    Status  Partially Met   no assit device yet 3+/10 pain 03/27/18           Plan -  03/27/18 1424    Clinical Impression Statement  Patient tolerated treatment well today. Patient is still limited with prolong standing activities due to pain in knee. Patient is to have an MRI for her knee to determine further POC. Patient unable to meet any further goals.     Rehab Potential  Excellent    PT Frequency  3x / week    PT Duration  4 weeks    PT Treatment/Interventions  ADLs/Self Care Home Management;Cryotherapy;Electrical Stimulation;Therapeutic activities;Therapeutic exercise;Ultrasound;Moist Heat;Patient/family education;Manual techniques;Vasopneumatic Device    PT Next Visit Plan  on hold pending MRI and MD appt    Consulted and Agree with Plan of Care  Patient       Patient will benefit from skilled therapeutic intervention in order to improve the following deficits and impairments:  Pain, Decreased activity tolerance, Decreased strength  Visit Diagnosis: Acute pain of right knee  Stiffness of right knee, not elsewhere classified  Localized edema     Problem List Patient Active Problem List   Diagnosis Date Noted  . Encounter for well woman exam with routine gynecological exam 03/25/2018  . Screening for colorectal cancer 03/25/2018  . Fecal occult blood test positive 03/25/2018  . Current use of estrogen therapy 03/25/2018  . Orthostatic hypotension 05/03/2017  . Syncope due to orthostatic hypotension 05/03/2017  . Symptomatic cholelithiasis 05/02/2017  . Acute cholecystitis 05/02/2017  . Abnormal nuclear stress test   . Chest pain 04/27/2017  . Hyperglycemia 04/27/2017  . Anemia 04/27/2017  . Essential hypertension 07/15/2014  . Asthma, chronic 07/15/2014  . Vitamin D deficiency 07/15/2014    Hermelinda Dellen, PTA 03/27/2018, 2:42 PM  [facility] [address] [place], [place], [address] Phone: [phone]   Fax:  [phone]  Name: Traci Johnson MRN: [id number] Date of Birth: July 30, 1950

## 2018-03-28 DIAGNOSIS — M238X1 Other internal derangements of right knee: Secondary | ICD-10-CM | POA: Diagnosis not present

## 2018-03-28 DIAGNOSIS — M25461 Effusion, right knee: Secondary | ICD-10-CM | POA: Diagnosis not present

## 2018-03-28 DIAGNOSIS — M222X1 Patellofemoral disorders, right knee: Secondary | ICD-10-CM | POA: Diagnosis not present

## 2018-03-28 DIAGNOSIS — M65861 Other synovitis and tenosynovitis, right lower leg: Secondary | ICD-10-CM | POA: Diagnosis not present

## 2018-04-02 ENCOUNTER — Encounter: Payer: Self-pay | Admitting: Physical Therapy

## 2018-04-02 ENCOUNTER — Ambulatory Visit: Payer: BLUE CROSS/BLUE SHIELD | Admitting: Physical Therapy

## 2018-04-02 DIAGNOSIS — M25661 Stiffness of right knee, not elsewhere classified: Secondary | ICD-10-CM | POA: Diagnosis not present

## 2018-04-02 DIAGNOSIS — M25561 Pain in right knee: Secondary | ICD-10-CM | POA: Diagnosis not present

## 2018-04-02 DIAGNOSIS — R600 Localized edema: Secondary | ICD-10-CM | POA: Diagnosis not present

## 2018-04-02 NOTE — Therapy (Addendum)
[facility] [place], [place], [address] Phone: [phone]   Fax:  [phone]  Physical Therapy Treatment  Patient Details  Name: Traci Johnson MRN: [id number] Date of Birth: 06-Oct-1950 Referring Provider (PT): Jaynee Eagles MD.   Encounter Date: 04/02/2018  PT End of Session - 04/02/18 1532    Visit Number  18    Number of Visits  18    Date for PT Re-Evaluation  04/09/18    PT Start Time  0225    PT Stop Time  0305    PT Time Calculation (min)  40 min    Activity Tolerance  Patient tolerated treatment well;Patient limited by pain    Behavior During Therapy  [facility] for tasks assessed/performed       Past Medical History:  Diagnosis Date   Allergy    Asthma    Hypertension    Migraines    cluster    Past Surgical History:  Procedure Laterality Date   ABDOMINAL HYSTERECTOMY  1985   CHOLECYSTECTOMY N/A 05/02/2017   Procedure: LAPAROSCOPIC CHOLECYSTECTOMY;  Surgeon: Mickeal Skinner, MD;  Location: [facility];  Service: General;  Laterality: N/A;   LEFT HEART CATH AND CORONARY ANGIOGRAPHY N/A 04/30/2017   Procedure: LEFT HEART CATH AND CORONARY ANGIOGRAPHY;  Surgeon: Lorretta Harp, MD;  Location: [place] [facility];  Service: Cardiovascular;  Laterality: N/A;   LIGAMENT REPAIR Right    birth defect    There were no vitals filed for this visit.  Subjective Assessment - 04/02/18 1532    Subjective  Hurting a lot today.  Pain woke me up at night.  Shooting into calf.    Currently in Pain?  Yes    Pain Score  7     Pain Location  Knee    Pain Orientation  Right    Pain Descriptors / Indicators  Shooting    Pain Type  Surgical pain    Pain Onset  More than a month ago                       [facility] Adult PT Treatment/Exercise - 04/02/18 0001      Modalities   Modalities  Electrical Stimulation;Ultrasound;Vasopneumatic      Electrical Stimulation   Electrical Stimulation Location   Right medial knee.    Electrical Stimulation Action  Pre-mod.    Electrical Stimulation Parameters  80-150 Hz x 20 minutes.    Electrical Stimulation Goals  Pain      Ultrasound   Ultrasound Location  Right medial knee.    Ultrasound Parameters  Combo e'stim/U/S at 1.20 W/CM2 x 8 minutes.    Ultrasound Goals  Pain      Vasopneumatic   Number Minutes Vasopneumatic   20 minutes    Vasopnuematic Location   --   Right knee.   Vasopneumatic Pressure  Low                  PT Long Term Goals - 03/27/18 1441      PT LONG TERM GOAL #1   Title  Independent with a HEP.    Time  6    Period  Weeks    Status  On-going      PT LONG TERM GOAL #2   Title  Full right active knee extension in order to normalize gait.    Time  6    Period  Weeks    Status  Achieved  PT LONG TERM GOAL #3   Title  Increase right knee strength to a solid 5/5 to provide good stability for accomplishment of functional activities.    Time  6    Period  Weeks    Status  Not Met      PT LONG TERM GOAL #4   Title  Perform a reciprocating stair gait with one railing with pain not > 2-3/10.    Time  6    Period  Weeks    Status  On-going   3+/10 pain 03/27/18     PT LONG TERM GOAL #5   Title  Walk a community distance without assistive device and pain not > 2-3/10.    Time  6    Period  Weeks    Status  Partially Met   no assit device yet 3+/10 pain 03/27/18           Plan - 04/02/18 1535    Clinical Impression Statement  Patient did well with treatment today and felt better but still has a significnat amount of pain.  She is awaitng a call from her surgeon to for the outcome of her MRI.  Treatments will be placed on hold at this time.    PT Treatment/Interventions  ADLs/Self Care Home Management;Cryotherapy;Electrical Stimulation;Therapeutic activities;Therapeutic exercise;Ultrasound;Moist Heat;Patient/family education;Manual techniques;Vasopneumatic Device    PT Next Visit Plan  on  hold pending MRI and MD appt    Consulted and Agree with Plan of Care  Patient       Patient will benefit from skilled therapeutic intervention in order to improve the following deficits and impairments:  Pain, Decreased activity tolerance, Decreased strength  Visit Diagnosis: Acute pain of right knee  Stiffness of right knee, not elsewhere classified  Localized edema     Problem List Patient Active Problem List   Diagnosis Date Noted   Encounter for well woman exam with routine gynecological exam 03/25/2018   Screening for colorectal cancer 03/25/2018   Fecal occult blood test positive 03/25/2018   Current use of estrogen therapy 03/25/2018   Orthostatic hypotension 05/03/2017   Syncope due to orthostatic hypotension 05/03/2017   Symptomatic cholelithiasis 05/02/2017   Acute cholecystitis 05/02/2017   Abnormal nuclear stress test    Chest pain 04/27/2017   Hyperglycemia 04/27/2017   Anemia 04/27/2017   Essential hypertension 07/15/2014   Asthma, chronic 07/15/2014   Vitamin D deficiency 07/15/2014   PHYSICAL THERAPY DISCHARGE SUMMARY  Visits from Start of Care: 18.  Current functional level related to goals / functional outcomes: See above.   Remaining deficits: See goal section.   Education / Equipment: HEP. Plan: Patient agrees to discharge.  Patient goals were partially met. Patient is being discharged due to                                                     ?????      Dov Dill, [place] MPT 04/02/2018, 3:37 PM  [facility] [place], [place], [address] Phone: [phone]   Fax:  [phone]  Name: Traci Johnson MRN: [id number] Date of Birth: 12-Nov-1950

## 2018-04-05 DIAGNOSIS — M79661 Pain in right lower leg: Secondary | ICD-10-CM | POA: Diagnosis not present

## 2018-04-05 DIAGNOSIS — M62831 Muscle spasm of calf: Secondary | ICD-10-CM | POA: Diagnosis not present

## 2018-04-09 ENCOUNTER — Other Ambulatory Visit (HOSPITAL_COMMUNITY): Payer: Self-pay | Admitting: Internal Medicine

## 2018-04-09 DIAGNOSIS — Z1231 Encounter for screening mammogram for malignant neoplasm of breast: Secondary | ICD-10-CM

## 2018-04-10 DIAGNOSIS — M25561 Pain in right knee: Secondary | ICD-10-CM | POA: Diagnosis not present

## 2018-04-12 ENCOUNTER — Other Ambulatory Visit: Payer: Self-pay | Admitting: Physician Assistant

## 2018-04-12 ENCOUNTER — Other Ambulatory Visit (INDEPENDENT_AMBULATORY_CARE_PROVIDER_SITE_OTHER): Payer: Self-pay | Admitting: Physician Assistant

## 2018-04-12 DIAGNOSIS — M25561 Pain in right knee: Secondary | ICD-10-CM | POA: Diagnosis not present

## 2018-04-12 DIAGNOSIS — M25562 Pain in left knee: Principal | ICD-10-CM

## 2018-04-16 ENCOUNTER — Encounter: Payer: Self-pay | Admitting: Internal Medicine

## 2018-04-16 ENCOUNTER — Ambulatory Visit: Payer: BLUE CROSS/BLUE SHIELD | Admitting: Internal Medicine

## 2018-04-16 ENCOUNTER — Ambulatory Visit (INDEPENDENT_AMBULATORY_CARE_PROVIDER_SITE_OTHER)
Admission: RE | Admit: 2018-04-16 | Discharge: 2018-04-16 | Disposition: A | Discharge status: Home / Self Care | Payer: BLUE CROSS/BLUE SHIELD | Source: Ambulatory Visit | Attending: Internal Medicine | Admitting: Internal Medicine

## 2018-04-16 VITALS — BP 122/78 | HR 116 | Ht 63.0 in | Wt 207.0 lb

## 2018-04-16 DIAGNOSIS — J4530 Mild persistent asthma, uncomplicated: Secondary | ICD-10-CM | POA: Diagnosis not present

## 2018-04-16 DIAGNOSIS — Z825 Family history of asthma and other chronic lower respiratory diseases: Secondary | ICD-10-CM | POA: Diagnosis not present

## 2018-04-16 LAB — NITRIC OXIDE: Nitric Oxide: 32

## 2018-04-16 NOTE — Progress Notes (Signed)
Traci Johnson, female    DOB: [birth year],   MRN: [id number]   Brief patient profile:  66 yowf never smoker with severe childhood asthma and continued into adulthood and after an admit to [facility] in early 2000s  And much better since with minimal use of saba and referred to pulmonary clinic 04/16/2018 by Dr Fredonia Highland for R Knee partial replacement.  04/16/2018  Pulmonary/ 1st office eval/Marleta Lapierre  Chief Complaint  Patient presents with   Pulmonary Consult    Referred by Dr. Percell Miller for pulmonary clearance for knee replacement.  She states she was dxed with asthma at birth. She states overall her breathing is doing well. She has occ chest tightness when the weather changed. She rarely uses her albuterol inhaler.   Dyspnea:  Limited by knee pain but prior to injury walking 5 miles flat but not since July 2019/ weather changes can trigger sob/chest tight/ assoc nasal obst  Cough: no Sleep: 20-30 degrees/ rarely due to breathing or coughing  SABA use: last used 2 days prior to OV    Nasal obst better over the last occ benadryl   No obvious other  day to day or daytime variability or assoc excess/ purulent sputum or mucus plugs or hemoptysis or cp or subjective wheeze or overt sinus or hb symptoms.   Sleeps as above presently  without nocturnal  or early am exacerbation  of respiratory  c/o's or need for noct saba. Also denies any obvious fluctuation of symptoms with weather or environmental changes or other aggravating or alleviating factors except as outlined above   No unusual exposure hx or h/o childhood pna or knowledge of premature birth.  Current Allergies, Complete Past Medical History, Past Surgical History, Family History, and Social History were reviewed in [employer] record.  ROS  The following are not active complaints unless bolded Hoarseness, sore throat, dysphagia, dental problems, itching, sneezing,  nasal congestion or discharge of excess mucus or purulent  secretions, ear ache,   fever, chills, sweats, unintended wt loss or wt gain, classically pleuritic or exertional cp,  orthopnea pnd or arm/hand swelling  or leg swelling, presyncope, palpitations, abdominal pain, anorexia, nausea, vomiting, diarrhea  or change in bowel habits or change in bladder habits, change in stools or change in urine, dysuria, hematuria,  rash, arthralgias, visual complaints, headache, numbness, weakness or ataxia or problems with walking or coordination,  change in mood or  memory.            Past Medical History:  Diagnosis Date   Allergy    Asthma    Hypertension    Migraines    cluster    Outpatient Medications Prior to Visit  Medication Sig Dispense Refill   albuterol (PROVENTIL HFA;VENTOLIN HFA) 108 (90 BASE) MCG/ACT inhaler Inhale 2 puffs into the lungs every 6 (six) hours as needed for wheezing.     amLODipine (NORVASC) 5 MG tablet Take 5 mg by mouth daily.  3   aspirin 81 MG tablet Take 81 mg by mouth daily.     Cholecalciferol (VITAMIN D3) 5000 units CAPS Take 1 capsule by mouth daily.     cyclobenzaprine (FLEXERIL) 5 MG tablet TAKE ONE TABLET BY MOUTH THREE TIMES DAILY AS NEEDED FOR MUSCLE SPASM 30 tablet 0   estradiol (ESTRACE) 1 MG tablet Take 1 tablet (1 mg total) by mouth daily. 90 tablet 4   Fluticasone-Salmeterol (ADVAIR DISKUS) 100-50 MCG/DOSE AEPB INHALE ONE DOSE BY MOUTH EVERY 12 HOURS 60 each  11   hydrochlorothiazide (HYDRODIURIL) 25 MG tablet Take 25 mg by mouth daily.  0   meloxicam (MOBIC) 15 MG tablet TAKE ONE TABLET BY MOUTH ONCE DAILY 30 tablet 0   methocarbamol (ROBAXIN) 750 MG tablet Take 1 tablet by mouth 2 (two) times daily as needed.  0   Potassium 99 MG TABS Take 1 tablet by mouth daily.     SUMAtriptan (IMITREX) 6 MG/0.5ML SOLN injection Inject 6 mg into the skin every 2 (two) hours as needed for migraine or headache.      traMADol (ULTRAM) 50 MG tablet Take 1 tablet (50 mg total) by mouth every 6 (six) hours as  needed (mild pain if unrelieved by Tylenol). 30 tablet 0   vitamin B-12 (CYANOCOBALAMIN) 1000 MCG tablet Take 1,000 mcg by mouth daily.     vitamin E (VITAMIN E) 400 UNIT capsule Take 400 Units by mouth daily.     No facility-administered medications prior to visit.      Objective:     BP 122/78 (BP Location: Left Arm, Cuff Size: Normal)    Pulse (!) 116    Ht _0  (1.6 m)    Wt 207 lb (93.9 kg)    SpO2 96%    BMI 36.67 kg/m   SpO2: 96 % RA  Pleasant amb wf nad  HEENT: nl dentition, turbinates bilaterally, and oropharynx. Nl external ear canals without cough reflex   NECK :  without JVD/Nodes/TM/ nl carotid upstrokes bilaterally   LUNGS: no acc muscle use,  Nl contour chest which is clear to A and P bilaterally without cough on insp or exp maneuvers   CV:  RRR  no s3 or murmur or increase in P2, and no edema   ABD:  Obese/ soft and nontender with limited  inspiratory excursion in the supine position. No bruits or organomegaly appreciated, bowel sounds nl  MS:  Nl gait/ ext warm without deformities, calf tenderness, cyanosis or clubbing No obvious joint restrictions   SKIN: warm and dry without lesions    NEURO:  alert, approp, nl sensorium with  no motor or cerebellar deficits apparent.    CXR PA and Lateral:   04/16/2018 :    I personally reviewed images and agree with radiology impression as follows:   No active cardiopulmonary disease.       Assessment   Mild persistent asthma without complication Spirometry 73/41/9379  FEV1 2.4 (104%)  Ratio 82 s curvature on advair 100  FENO 04/16/2018  =    32 on Advair 100 04/16/2018  After extensive coaching inhaler device,  effectiveness =    90%    All goals of chronic asthma control met including optimal function and elimination of symptoms with minimal need for rescue therapy.  Contingencies discussed in full including contacting this office immediately if not controlling the symptoms using the rule of two's.       There is no pulmonary contraindication to proceeding with Knee surgery including if requires general anesthesia so I cleared her for surgery today     Total time devoted to counseling  > 50 % of initial 45  min office visit:  review case with pt/ discussion of options/alternatives/ personally creating written customized instructions  in presence of pt  then going over those specific  Instructions directly with the pt including how to use all of the meds but in particular covering each new medication in detail and the difference between the maintenance= "automatic" meds and the prns  using an action plan format for the latter (If this problem/symptom => do that organization reading Left to right).  Please see AVS from this visit for a full list of these instructions which I personally wrote for this pt and  are unique to this visit.    See device teaching which extended face to face time for this visit      Christinia Gully, MD 04/16/2018

## 2018-04-16 NOTE — Assessment & Plan Note (Addendum)
Spirometry 04/16/2018  FEV1 2.4 (104%)  Ratio 82 s curvature on advair 100  FENO 04/16/2018  =    32 on Advair 100 04/16/2018  After extensive coaching inhaler device,  effectiveness =    90%    All goals of chronic asthma control met including optimal function and elimination of symptoms with minimal need for rescue therapy.  Contingencies discussed in full including contacting this office immediately if not controlling the symptoms using the rule of two's.      There is no pulmonary contraindication to proceeding with Knee surgery including if requires general anesthesia so I cleared her for surgery today     Total time devoted to counseling  > 50 % of initial 45  min office visit:  review case with pt/ discussion of options/alternatives/ personally creating written customized instructions  in presence of pt  then going over those specific  Instructions directly with the pt including how to use all of the meds but in particular covering each new medication in detail and the difference between the maintenance= "automatic" meds and the prns using an action plan format for the latter (If this problem/symptom => do that organization reading Left to right).  Please see AVS from this visit for a full list of these instructions which I personally wrote for this pt and  are unique to this visit.    See device teaching which extended face to face time for this visit

## 2018-04-16 NOTE — Progress Notes (Signed)
Spoke with pt and notified of results per Dr. Wert. Pt verbalized understanding and denied any questions. 

## 2018-04-16 NOTE — Patient Instructions (Signed)
No change in medications needed for now   Please remember to go to the  x-ray department downstairs in the basement  for your tests - we will call you with the results when they are available.      I will clear you for surgery after I review your cxr but see no reason you can't proceed with it.

## 2018-04-23 ENCOUNTER — Institutional Professional Consult (permissible substitution): Payer: BLUE CROSS/BLUE SHIELD | Admitting: Internal Medicine

## 2018-04-29 DIAGNOSIS — M25561 Pain in right knee: Secondary | ICD-10-CM | POA: Diagnosis not present

## 2018-04-29 DIAGNOSIS — M1711 Unilateral primary osteoarthritis, right knee: Secondary | ICD-10-CM | POA: Diagnosis not present

## 2018-05-09 DIAGNOSIS — Z96651 Presence of right artificial knee joint: Secondary | ICD-10-CM | POA: Diagnosis not present

## 2018-05-09 DIAGNOSIS — M1711 Unilateral primary osteoarthritis, right knee: Secondary | ICD-10-CM | POA: Diagnosis not present

## 2018-05-13 ENCOUNTER — Ambulatory Visit (HOSPITAL_COMMUNITY): Payer: BLUE CROSS/BLUE SHIELD

## 2018-06-28 ENCOUNTER — Encounter (HOSPITAL_COMMUNITY): Payer: Self-pay

## 2018-06-28 ENCOUNTER — Ambulatory Visit (HOSPITAL_COMMUNITY)
Admission: RE | Admit: 2018-06-28 | Discharge: 2018-06-28 | Disposition: A | Discharge status: Home / Self Care | Payer: BLUE CROSS/BLUE SHIELD | Source: Ambulatory Visit | Attending: Internal Medicine | Admitting: Internal Medicine

## 2018-06-28 DIAGNOSIS — Z1231 Encounter for screening mammogram for malignant neoplasm of breast: Secondary | ICD-10-CM | POA: Diagnosis not present

## 2018-07-01 DIAGNOSIS — R7301 Impaired fasting glucose: Secondary | ICD-10-CM | POA: Diagnosis not present

## 2018-07-01 DIAGNOSIS — E782 Mixed hyperlipidemia: Secondary | ICD-10-CM | POA: Diagnosis not present

## 2018-07-01 DIAGNOSIS — I10 Essential (primary) hypertension: Secondary | ICD-10-CM | POA: Diagnosis not present

## 2018-07-08 DIAGNOSIS — E663 Overweight: Secondary | ICD-10-CM | POA: Diagnosis not present

## 2018-07-08 DIAGNOSIS — I10 Essential (primary) hypertension: Secondary | ICD-10-CM | POA: Diagnosis not present

## 2018-07-08 DIAGNOSIS — E782 Mixed hyperlipidemia: Secondary | ICD-10-CM | POA: Diagnosis not present

## 2018-07-08 DIAGNOSIS — Z0001 Encounter for general adult medical examination with abnormal findings: Secondary | ICD-10-CM | POA: Diagnosis not present

## 2018-07-08 DIAGNOSIS — Z6841 Body Mass Index (BMI) 40.0 and over, adult: Secondary | ICD-10-CM | POA: Diagnosis not present

## 2018-07-08 DIAGNOSIS — Z0000 Encounter for general adult medical examination without abnormal findings: Secondary | ICD-10-CM | POA: Diagnosis not present

## 2018-07-08 DIAGNOSIS — R7301 Impaired fasting glucose: Secondary | ICD-10-CM | POA: Diagnosis not present

## 2018-08-08 ENCOUNTER — Encounter: Payer: Self-pay | Admitting: Physical Therapy

## 2018-08-08 ENCOUNTER — Ambulatory Visit: Payer: BLUE CROSS/BLUE SHIELD | Attending: Orthopedic Surgery | Admitting: Physical Therapy

## 2018-08-08 DIAGNOSIS — R600 Localized edema: Secondary | ICD-10-CM | POA: Diagnosis not present

## 2018-08-08 DIAGNOSIS — M25661 Stiffness of right knee, not elsewhere classified: Secondary | ICD-10-CM | POA: Diagnosis not present

## 2018-08-08 DIAGNOSIS — M6281 Muscle weakness (generalized): Secondary | ICD-10-CM | POA: Diagnosis not present

## 2018-08-08 DIAGNOSIS — M25561 Pain in right knee: Secondary | ICD-10-CM

## 2018-08-08 NOTE — Therapy (Signed)
[facility] [place], [place], [address] Phone: [phone]   Fax:  [phone]  Physical Therapy Evaluation  Patient Details  Name: Traci Johnson MRN: [id number] Date of Birth: [birth year] Referring Provider (PT): Jaynee Eagles MD.   Encounter Date: 08/08/2018  PT End of Session - 08/08/18 1635    Visit Number  1    Number of Visits  12    Date for PT Re-Evaluation  09/19/18    Authorization Type  FOTO AT LEAST EVERY 5TH VISIT.  KX MODIFIER AFTER 15 VISITS.  PROGRESS NOTE AT 10TH VISIT.    PT Start Time  0317    PT Stop Time  0405    PT Time Calculation (min)  48 min    Activity Tolerance  Patient tolerated treatment well;Patient limited by pain    Behavior During Therapy  [facility] for tasks assessed/performed       Past Medical History:  Diagnosis Date   Allergy    Asthma    Hypertension    Migraines    cluster    Past Surgical History:  Procedure Laterality Date   ABDOMINAL HYSTERECTOMY  1985   CHOLECYSTECTOMY N/A 05/02/2017   Procedure: LAPAROSCOPIC CHOLECYSTECTOMY;  Surgeon: Mickeal Skinner, MD;  Location: [facility];  Service: General;  Laterality: N/A;   LEFT HEART CATH AND CORONARY ANGIOGRAPHY N/A 04/30/2017   Procedure: LEFT HEART CATH AND CORONARY ANGIOGRAPHY;  Surgeon: Lorretta Harp, MD;  Location: [facility];  Service: Cardiovascular;  Laterality: N/A;   LIGAMENT REPAIR Right    birth defect    There were no vitals filed for this visit.   Subjective Assessment - 08/08/18 1643    Subjective  The patient underwent a right knee unicompartmental knee surgery on 05/10/19. She states she is pleased with her progress but has continued pain rated at rest today at 2/10 and up to 4-4+/5 with increased walking and trying to perform stairs which she does in a non-reciprocating fashion.  Rest decreases her pain.    Pertinent History  Right knee surgery in 1974 to "relocate kneecap"; HTN, right knee  scope.    How long can you walk comfortably?  Short community distance.    Patient Stated Goals  Perform stairs without pain.    Currently in Pain?  Yes    Pain Score  2     Pain Location  Knee    Pain Orientation  Right    Pain Descriptors / Indicators  Dull;Aching    Pain Type  Chronic pain    Pain Onset  More than a month ago    Pain Frequency  Constant    Aggravating Factors   See above.    Pain Relieving Factors  See above.         [facility] PT Assessment - 08/08/18 0001      Assessment   Medical Diagnosis  S/p right knee unicompartmental arhtroplasty    Onset Date/Surgical Date  --   05/09/18(surgery date).     Precautions   Precautions  --   No ultrasound.     Restrictions   Weight Bearing Restrictions  No      Balance Screen   Has the patient fallen in the past 6 months  No    Has the patient had a decrease in activity level because of a fear of falling?   Yes    Is the patient reluctant to leave their home because of a fear of  falling?   No      Home Environment   Living Environment  Private residence      Prior Function   Level of Independence  Independent      Observation/Other Assessments   Focus on Therapeutic Outcomes (FOTO)   62% limitation.      AROM   Overall AROM Comments  Right knee -5 to 130 degrees.      Strength   Overall Strength Comments  Right hip abduction= 4+/5 and right knee 4+/5.      Palpation   Palpation comment  Very tender to palpation at right knee distal ITB/bursal region.      Ambulation/Gait   Gait Comments  Mild gait antalgia noted.                Objective measurements completed on examination: See above findings.      [facility] Adult PT Treatment/Exercise - 08/08/18 0001      Modalities   Modalities  Electrical Stimulation;Vasopneumatic      Electrical Stimulation   Electrical Stimulation Location  Right distal ITB.    Electrical Stimulation Action  Pre-mod.    Electrical Stimulation Parameters  80-150 Hz x  20 minutes.    Electrical Stimulation Goals  Pain      Vasopneumatic   Number Minutes Vasopneumatic   20 minutes    Vasopnuematic Location   --   Right knee.   Vasopneumatic Pressure  Medium               PT Short Term Goals - 08/08/18 1714      PT SHORT TERM GOAL #1   Title  STG's=LTG's        PT Long Term Goals - 08/08/18 1715      PT LONG TERM GOAL #1   Title  Independent with a HEP.    Time  6    Period  Weeks    Status  New      PT LONG TERM GOAL #2   Title  Full right active knee extension in order to normalize gait.    Time  6    Period  Weeks    Status  New      PT LONG TERM GOAL #3   Title  Increase right knee/hip strength to a solid 5/5 to provide good stability for accomplishment of functional activities.    Time  6    Period  Weeks    Status  New      PT LONG TERM GOAL #4   Title  Perform a reciprocating stair gait with one railing with pain not > 2-3/10.    Time  6    Period  Weeks    Status  New      PT LONG TERM GOAL #5   Title  Walk a community distance without assistive device and pain not > 2-3/10.    Time  6    Period  Weeks    Status  New             Plan - 08/08/18 1703    Clinical Impression Statement  The u Over, is nderwent a right knee unicompartmental surgery on 05/09/18.  Her FOTO limitation is 62%.  Overall, she is very pleased but does have continued pain and difficulty with stairs.  She was very tender to palption over the distal ITB/bursal region.    Patient will benefit from skilled physical therapy intervention to address deficits and pain.  Personal Factors and Comorbidities  Past/Current Experience    Stability/Clinical Decision Making  Stable/Uncomplicated    Clinical Decision Making  Low    Rehab Potential  Excellent    PT Frequency  2x / week    PT Duration  6 weeks    PT Treatment/Interventions  ADLs/Self Care Home Management;Cryotherapy;Electrical Stimulation;Iontophoresis 4mg /ml  Dexamethasone;Therapeutic exercise;Therapeutic activities;Functional mobility training;Patient/family education;Manual techniques;Passive range of motion;Vasopneumatic Device    PT Next Visit Plan  Please perform IASTM to patient right distal ITB area, stationary bike.  PRE's.  Vaso/E'stim    Consulted and Agree with Plan of Care  Patient       Patient will benefit from skilled therapeutic intervention in order to improve the following deficits and impairments:  Pain, Decreased activity tolerance, Decreased strength, Decreased range of motion  Visit Diagnosis: Acute pain of right knee - Plan: PT plan of care cert/re-cert  Stiffness of right knee, not elsewhere classified - Plan: PT plan of care cert/re-cert  Muscle weakness (generalized) - Plan: PT plan of care cert/re-cert     Problem List Patient Active Problem List   Diagnosis Date Noted   Encounter for well woman exam with routine gynecological exam 03/25/2018   Screening for colorectal cancer 03/25/2018   Fecal occult blood test positive 03/25/2018   Current use of estrogen therapy 03/25/2018   Orthostatic hypotension 05/03/2017   Syncope due to orthostatic hypotension 05/03/2017   Symptomatic cholelithiasis 05/02/2017   Acute cholecystitis 05/02/2017   Abnormal nuclear stress test    Chest pain 04/27/2017   Hyperglycemia 04/27/2017   Anemia 04/27/2017   Essential hypertension 07/15/2014   Mild persistent asthma without complication 05/39/7673   Vitamin D deficiency 07/15/2014    Rashad Obeid, [place] MPT 08/08/2018, 5:19 PM  [facility] [address] [place], [place], [address] Phone: [phone]   Fax:  [phone]  Name: Traci Johnson MRN: [id number] Date of Birth: 07/31/1950

## 2018-08-12 ENCOUNTER — Ambulatory Visit: Payer: BLUE CROSS/BLUE SHIELD | Admitting: Physical Therapy

## 2018-08-12 ENCOUNTER — Encounter: Payer: Self-pay | Admitting: Physical Therapy

## 2018-08-12 DIAGNOSIS — M6281 Muscle weakness (generalized): Secondary | ICD-10-CM

## 2018-08-12 DIAGNOSIS — R600 Localized edema: Secondary | ICD-10-CM | POA: Diagnosis not present

## 2018-08-12 DIAGNOSIS — M25661 Stiffness of right knee, not elsewhere classified: Secondary | ICD-10-CM | POA: Diagnosis not present

## 2018-08-12 DIAGNOSIS — M25561 Pain in right knee: Secondary | ICD-10-CM

## 2018-08-12 NOTE — Therapy (Signed)
[facility] [place], [place], [address] Phone: [phone]   Fax:  [phone]  Physical Therapy Treatment  Patient Details  Name: Traci Johnson MRN: [id number] Date of Birth: 1950/11/20 Referring Provider (PT): Jaynee Eagles MD.   Encounter Date: 08/12/2018  PT End of Session - 08/12/18 0936    Visit Number  2    Number of Visits  12    Date for PT Re-Evaluation  09/19/18    Authorization Type  FOTO AT LEAST EVERY 5TH VISIT.  KX MODIFIER AFTER 15 VISITS.  PROGRESS NOTE AT 10TH VISIT.    PT Start Time  0900    PT Stop Time  0949    PT Time Calculation (min)  49 min    Activity Tolerance  Patient tolerated treatment well    Behavior During Therapy  WFL for tasks assessed/performed       Past Medical History:  Diagnosis Date   Allergy    Asthma    Hypertension    Migraines    cluster    Past Surgical History:  Procedure Laterality Date   ABDOMINAL HYSTERECTOMY  1985   CHOLECYSTECTOMY N/A 05/02/2017   Procedure: LAPAROSCOPIC CHOLECYSTECTOMY;  Surgeon: Mickeal Skinner, MD;  Location: [facility];  Service: General;  Laterality: N/A;   LEFT HEART CATH AND CORONARY ANGIOGRAPHY N/A 04/30/2017   Procedure: LEFT HEART CATH AND CORONARY ANGIOGRAPHY;  Surgeon: Lorretta Harp, MD;  Location: [facility];  Service: Cardiovascular;  Laterality: N/A;   LIGAMENT REPAIR Right    birth defect    There were no vitals filed for this visit.  Subjective Assessment - 08/12/18 0902    Subjective  Patient arrived with some discomfort yet doing well since surgery    Pertinent History  Right knee surgery in 1974 to "relocate kneecap"; HTN, right knee scope.    Limitations  Walking    How long can you sit comfortably?  None    How long can you stand comfortably?  None    How long can you walk comfortably?  Short community distance.    Patient Stated Goals  Perform stairs without pain.    Currently in Pain?  Yes     Pain Score  2     Pain Location  Knee    Pain Orientation  Right    Pain Descriptors / Indicators  Discomfort;Dull    Pain Type  Chronic pain    Pain Onset  More than a month ago    Pain Frequency  Constant    Aggravating Factors   prolong activity    Pain Relieving Factors  at rest                       [facility] Adult PT Treatment/Exercise - 08/12/18 0001      Knee/Hip Exercises: Aerobic   Nustep  68min L5 UE/LE      Knee/Hip Exercises: Standing   Forward Step Up  Right;2 sets;10 reps;Step Height: 4";Hand Hold: 1    Rocker Board  3 minutes      Electrical Stimulation   Electrical Stimulation Location  Right distal ITB.    Child psychotherapist Parameters  80-150hz  x59min    Electrical Stimulation Goals  Pain      Vasopneumatic   Number Minutes Vasopneumatic   15 minutes    Vasopnuematic Location   Knee    Vasopneumatic Pressure  Medium  Manual Therapy   Manual Therapy  Soft tissue mobilization    Manual therapy comments  manual and IASTM to right distal ITB to reduce pain and tone               PT Short Term Goals - 08/08/18 1714      PT SHORT TERM GOAL #1   Title  STG's=LTG's        PT Long Term Goals - 08/12/18 3419      PT LONG TERM GOAL #1   Title  Independent with a HEP.    Time  6    Period  Weeks    Status  On-going      PT LONG TERM GOAL #2   Title  Full right active knee extension in order to normalize gait.    Time  6    Period  Weeks    Status  On-going      PT LONG TERM GOAL #3   Title  Increase right knee/hip strength to a solid 5/5 to provide good stability for accomplishment of functional activities.    Time  6    Period  Weeks    Status  On-going      PT LONG TERM GOAL #4   Title  Perform a reciprocating stair gait with one railing with pain not > 2-3/10.    Time  6    Period  Weeks    Status  On-going      PT LONG TERM GOAL #5   Title  Walk a community distance  without assistive device and pain not > 2-3/10.    Time  6    Period  Weeks    Status  On-going            Plan - 08/12/18 6222    Clinical Impression Statement  Patient tolerated treatment well today. Patient reported walking in her yard yesterday for the first time in a very long time and she did use a walking stick. Today focused on gentle low level progression exercises followed by manual/IASTM to right distal ITB and modalities to reduce pain and tone. Patient very palpable tender spot on right distal ITB. Patient progressing with goals.     Personal Factors and Comorbidities  Past/Current Experience    Stability/Clinical Decision Making  Stable/Uncomplicated    Rehab Potential  Excellent    PT Frequency  2x / week    PT Duration  6 weeks    PT Treatment/Interventions  ADLs/Self Care Home Management;Cryotherapy;Electrical Stimulation;Iontophoresis 4mg /ml Dexamethasone;Therapeutic exercise;Therapeutic activities;Functional mobility training;Patient/family education;Manual techniques;Passive range of motion;Vasopneumatic Device    PT Next Visit Plan  cont with POC for  IASTM to patient right distal ITB area, stationary bike.  PRE's.  Vaso/E'stim    Consulted and Agree with Plan of Care  Patient       Patient will benefit from skilled therapeutic intervention in order to improve the following deficits and impairments:  Pain, Decreased activity tolerance, Decreased strength, Decreased range of motion  Visit Diagnosis: Acute pain of right knee  Stiffness of right knee, not elsewhere classified  Muscle weakness (generalized)     Problem List Patient Active Problem List   Diagnosis Date Noted   Encounter for well woman exam with routine gynecological exam 03/25/2018   Screening for colorectal cancer 03/25/2018   Fecal occult blood test positive 03/25/2018   Current use of estrogen therapy 03/25/2018   Orthostatic hypotension 05/03/2017   Syncope due to orthostatic  hypotension 05/03/2017   Symptomatic cholelithiasis 05/02/2017   Acute cholecystitis 05/02/2017   Abnormal nuclear stress test    Chest pain 04/27/2017   Hyperglycemia 04/27/2017   Anemia 04/27/2017   Essential hypertension 07/15/2014   Mild persistent asthma without complication 84/41/7127   Vitamin D deficiency 07/15/2014    Tarvaris Puglia P, PTA 08/12/2018, 9:50 AM  [facility] [place], [place], [address] Phone: [phone]   Fax:  [phone]  Name: Traci Johnson MRN: [id number] Date of Birth: 1950-11-01

## 2018-08-15 ENCOUNTER — Ambulatory Visit: Payer: BLUE CROSS/BLUE SHIELD | Admitting: Physical Therapy

## 2018-08-15 ENCOUNTER — Other Ambulatory Visit: Payer: Self-pay

## 2018-08-15 DIAGNOSIS — M25661 Stiffness of right knee, not elsewhere classified: Secondary | ICD-10-CM | POA: Diagnosis not present

## 2018-08-15 DIAGNOSIS — R600 Localized edema: Secondary | ICD-10-CM | POA: Diagnosis not present

## 2018-08-15 DIAGNOSIS — M6281 Muscle weakness (generalized): Secondary | ICD-10-CM | POA: Diagnosis not present

## 2018-08-15 DIAGNOSIS — M25561 Pain in right knee: Secondary | ICD-10-CM

## 2018-08-15 NOTE — Therapy (Signed)
[facility] [place], [place], [address] Phone: [phone]   Fax:  [phone]  Physical Therapy Treatment  Patient Details  Name: Traci Johnson MRN: [id number] Date of Birth: [birth year] Referring Provider (PT): Jaynee Eagles MD.   Encounter Date: 08/15/2018  PT End of Session - 08/15/18 1429    Visit Number  3    Number of Visits  12    Date for PT Re-Evaluation  09/19/18    Authorization Type  FOTO AT LEAST EVERY 5TH VISIT.  KX MODIFIER AFTER 15 VISITS.  PROGRESS NOTE AT 10TH VISIT.    PT Start Time  1430    PT Stop Time  1524    PT Time Calculation (min)  54 min    Activity Tolerance  Patient tolerated treatment well    Behavior During Therapy  WFL for tasks assessed/performed       Past Medical History:  Diagnosis Date   Allergy    Asthma    Hypertension    Migraines    cluster    Past Surgical History:  Procedure Laterality Date   ABDOMINAL HYSTERECTOMY  1985   CHOLECYSTECTOMY N/A 05/02/2017   Procedure: LAPAROSCOPIC CHOLECYSTECTOMY;  Surgeon: Mickeal Skinner, MD;  Location: [facility];  Service: General;  Laterality: N/A;   LEFT HEART CATH AND CORONARY ANGIOGRAPHY N/A 04/30/2017   Procedure: LEFT HEART CATH AND CORONARY ANGIOGRAPHY;  Surgeon: Lorretta Harp, MD;  Location: [facility];  Service: Cardiovascular;  Laterality: N/A;   LIGAMENT REPAIR Right    birth defect    There were no vitals filed for this visit.  Subjective Assessment - 08/15/18 1430    Subjective  Patient reports no pain today.    Patient Stated Goals  Perform stairs without pain.    Currently in Pain?  No/denies                       [facility] Adult PT Treatment/Exercise - 08/15/18 0001      Knee/Hip Exercises: Aerobic   Recumbent Bike  L5 x 2 min too painful    Nustep  78min L5 UE/LE      Knee/Hip Exercises: Standing   Hip Abduction  Stengthening;Both;2 sets;10 reps    Abduction Limitations   red band    Forward Step Up  Right;2 sets;10 reps;Hand Hold: 1    Forward Step Up Limitations  on foam    Step Down  Right;2 sets;10 reps;Hand Hold: 2;Step Height: 2";Step Height: 4";Step Height: 6"    Functional Squat  10 reps;3 sets    Functional Squat Limitations  feet on decline; pod between knees       Knee/Hip Exercises: Supine   Other Supine Knee/Hip Exercises   (green next time) clams RLE only red band x 30       Knee/Hip Exercises: Sidelying   Clams  x 30 with red band      Modalities   Modalities  Cryotherapy;Electrical Stimulation      Cryotherapy   Number Minutes Cryotherapy  15 Minutes    Cryotherapy Location  Knee    Type of Cryotherapy  Ice pack      Electrical Stimulation   Electrical Stimulation Location  right knee    Electrical Stimulation Action  premod    Electrical Stimulation Parameters  80-150 Hz x 15 min    Electrical Stimulation Goals  Pain  PT Short Term Goals - 08/08/18 1714      PT SHORT TERM GOAL #1   Title  STG's=LTG's        PT Long Term Goals - 08/12/18 1761      PT LONG TERM GOAL #1   Title  Independent with a HEP.    Time  6    Period  Weeks    Status  On-going      PT LONG TERM GOAL #2   Title  Full right active knee extension in order to normalize gait.    Time  6    Period  Weeks    Status  On-going      PT LONG TERM GOAL #3   Title  Increase right knee/hip strength to a solid 5/5 to provide good stability for accomplishment of functional activities.    Time  6    Period  Weeks    Status  On-going      PT LONG TERM GOAL #4   Title  Perform a reciprocating stair gait with one railing with pain not > 2-3/10.    Time  6    Period  Weeks    Status  On-going      PT LONG TERM GOAL #5   Title  Walk a community distance without assistive device and pain not > 2-3/10.    Time  6    Period  Weeks    Status  On-going            Plan - 08/15/18 1512    Clinical Impression Statement  Patient  did well with TE today. She is fearful of step ups/downs with right but did better with encouragement. Eccentric quad weakness and hip ER weakness evident with squats and step downs.  Some irritation to knee at end of treatment so modalities administered.   PT Treatment/Interventions  ADLs/Self Care Home Management;Cryotherapy;Electrical Stimulation;Iontophoresis 4mg /ml Dexamethasone;Therapeutic exercise;Therapeutic activities;Functional mobility training;Patient/family education;Manual techniques;Passive range of motion;Vasopneumatic Device    PT Next Visit Plan  Focus on step ups/downs, eccentric quads and hip weakness; cont with POC for  IASTM to patient right distal ITB area, stationary bike.  PRE's.  Vaso/E'stim       Patient will benefit from skilled therapeutic intervention in order to improve the following deficits and impairments:  Pain, Decreased activity tolerance, Decreased strength, Decreased range of motion  Visit Diagnosis: Acute pain of right knee  Stiffness of right knee, not elsewhere classified  Muscle weakness (generalized)  Localized edema     Problem List Patient Active Problem List   Diagnosis Date Noted   Encounter for well woman exam with routine gynecological exam 03/25/2018   Screening for colorectal cancer 03/25/2018   Fecal occult blood test positive 03/25/2018   Current use of estrogen therapy 03/25/2018   Orthostatic hypotension 05/03/2017   Syncope due to orthostatic hypotension 05/03/2017   Symptomatic cholelithiasis 05/02/2017   Acute cholecystitis 05/02/2017   Abnormal nuclear stress test    Chest pain 04/27/2017   Hyperglycemia 04/27/2017   Anemia 04/27/2017   Essential hypertension 07/15/2014   Mild persistent asthma without complication 60/73/7106   Vitamin D deficiency 07/15/2014   Cayci Mcnabb PT 08/15/2018, 3:16 PM  [facility] [address] [place], [place], [address] Phone:  [phone]   Fax:  [phone]  Name: Traci Johnson MRN: [id number] Date of Birth: 1950/09/30

## 2018-08-19 ENCOUNTER — Encounter: Payer: Self-pay | Admitting: Physical Therapy

## 2018-08-19 ENCOUNTER — Ambulatory Visit: Payer: BLUE CROSS/BLUE SHIELD | Admitting: Physical Therapy

## 2018-08-19 ENCOUNTER — Other Ambulatory Visit: Payer: Self-pay

## 2018-08-19 DIAGNOSIS — R600 Localized edema: Secondary | ICD-10-CM | POA: Diagnosis not present

## 2018-08-19 DIAGNOSIS — M25661 Stiffness of right knee, not elsewhere classified: Secondary | ICD-10-CM

## 2018-08-19 DIAGNOSIS — M6281 Muscle weakness (generalized): Secondary | ICD-10-CM | POA: Diagnosis not present

## 2018-08-19 DIAGNOSIS — M25561 Pain in right knee: Secondary | ICD-10-CM

## 2018-08-19 NOTE — Therapy (Signed)
[facility] [place], [place], [address] Phone: [phone]   Fax:  [phone]  Physical Therapy Treatment  Patient Details  Name: Traci Johnson MRN: [id number] Date of Birth: [birth year] Referring Provider (PT): Jaynee Eagles MD.   Encounter Date: 08/19/2018  PT End of Session - 08/19/18 1222    Visit Number  4    Number of Visits  12    Date for PT Re-Evaluation  09/19/18    Authorization Type  FOTO AT LEAST EVERY 5TH VISIT.  KX MODIFIER AFTER 15 VISITS.  PROGRESS NOTE AT 10TH VISIT.    PT Start Time  0900    PT Stop Time  0946    PT Time Calculation (min)  46 min       Past Medical History:  Diagnosis Date   Allergy    Asthma    Hypertension    Migraines    cluster    Past Surgical History:  Procedure Laterality Date   ABDOMINAL HYSTERECTOMY  1985   CHOLECYSTECTOMY N/A 05/02/2017   Procedure: LAPAROSCOPIC CHOLECYSTECTOMY;  Surgeon: Mickeal Skinner, MD;  Location: [facility];  Service: General;  Laterality: N/A;   LEFT HEART CATH AND CORONARY ANGIOGRAPHY N/A 04/30/2017   Procedure: LEFT HEART CATH AND CORONARY ANGIOGRAPHY;  Surgeon: Lorretta Harp, MD;  Location: [facility];  Service: Cardiovascular;  Laterality: N/A;   LIGAMENT REPAIR Right    birth defect    There were no vitals filed for this visit.  Subjective Assessment - 08/19/18 1223    Subjective  That last session was too hard.  Sore.    Currently in Pain?  Yes    Pain Score  3     Pain Location  Knee    Pain Orientation  Right    Pain Descriptors / Indicators  Sore    Pain Type  Chronic pain    Pain Onset  More than a month ago                       [facility] Adult PT Treatment/Exercise - 08/19/18 0001      Exercises   Exercises  Knee/Hip      Knee/Hip Exercises: Aerobic   Nustep  20 minutes at level 5.      Modalities   Modalities  Iontophoresis;Moist Heat      Moist Heat Therapy   Number Minutes  Moist Heat  10 Minutes    Moist Heat Location  --   Right knee.     Acupuncturist Location  Right lateral knee.    Electrical Stimulation Action  Pre-mod.    Electrical Stimulation Parameters  80-150 Hz x 10 minutes.    Electrical Stimulation Goals  Pain      Iontophoresis   Type of Iontophoresis  Dexamethasone    Location  Right lateral knee.    Dose  --   80 mA-Min.   Time  8               PT Short Term Goals - 08/08/18 1714      PT SHORT TERM GOAL #1   Title  STG's=LTG's        PT Long Term Goals - 08/12/18 3299      PT LONG TERM GOAL #1   Title  Independent with a HEP.    Time  6    Period  Weeks    Status  On-going      PT LONG TERM GOAL #2   Title  Full right active knee extension in order to normalize gait.    Time  6    Period  Weeks    Status  On-going      PT LONG TERM GOAL #3   Title  Increase right knee/hip strength to a solid 5/5 to provide good stability for accomplishment of functional activities.    Time  6    Period  Weeks    Status  On-going      PT LONG TERM GOAL #4   Title  Perform a reciprocating stair gait with one railing with pain not > 2-3/10.    Time  6    Period  Weeks    Status  On-going      PT LONG TERM GOAL #5   Title  Walk a community distance without assistive device and pain not > 2-3/10.    Time  6    Period  Weeks    Status  On-going            Plan - 08/19/18 1225    Clinical Impression Statement  Patient sore from last treatment.  Conservative treatment today.  Adding iontophoresis per signed MD cert.    PT Treatment/Interventions  ADLs/Self Care Home Management;Cryotherapy;Electrical Stimulation;Iontophoresis 4mg /ml Dexamethasone;Therapeutic exercise;Therapeutic activities;Functional mobility training;Patient/family education;Manual techniques;Passive range of motion;Vasopneumatic Device    Consulted and Agree with Plan of Care  Patient       Patient will benefit  from skilled therapeutic intervention in order to improve the following deficits and impairments:  Pain, Decreased activity tolerance, Decreased strength, Decreased range of motion  Visit Diagnosis: Acute pain of right knee  Stiffness of right knee, not elsewhere classified     Problem List Patient Active Problem List   Diagnosis Date Noted   Encounter for well woman exam with routine gynecological exam 03/25/2018   Screening for colorectal cancer 03/25/2018   Fecal occult blood test positive 03/25/2018   Current use of estrogen therapy 03/25/2018   Orthostatic hypotension 05/03/2017   Syncope due to orthostatic hypotension 05/03/2017   Symptomatic cholelithiasis 05/02/2017   Acute cholecystitis 05/02/2017   Abnormal nuclear stress test    Chest pain 04/27/2017   Hyperglycemia 04/27/2017   Anemia 04/27/2017   Essential hypertension 07/15/2014   Mild persistent asthma without complication 37/85/8850   Vitamin D deficiency 07/15/2014    Shemekia Patane, [place] MPT 08/19/2018, 12:27 PM  [facility] [address] [place], [place], [address] Phone: [phone]   Fax:  [phone]  Name: Traci Johnson MRN: [id number] Date of Birth: [birth year]

## 2018-08-21 DIAGNOSIS — L650 Telogen effluvium: Secondary | ICD-10-CM | POA: Diagnosis not present

## 2018-08-21 DIAGNOSIS — D225 Melanocytic nevi of trunk: Secondary | ICD-10-CM | POA: Diagnosis not present

## 2018-08-22 ENCOUNTER — Ambulatory Visit: Payer: BLUE CROSS/BLUE SHIELD | Admitting: Physical Therapy

## 2018-08-22 ENCOUNTER — Encounter: Payer: Self-pay | Admitting: Physical Therapy

## 2018-08-22 ENCOUNTER — Other Ambulatory Visit: Payer: Self-pay

## 2018-08-22 DIAGNOSIS — M6281 Muscle weakness (generalized): Secondary | ICD-10-CM | POA: Diagnosis not present

## 2018-08-22 DIAGNOSIS — M25661 Stiffness of right knee, not elsewhere classified: Secondary | ICD-10-CM | POA: Diagnosis not present

## 2018-08-22 DIAGNOSIS — M25561 Pain in right knee: Secondary | ICD-10-CM

## 2018-08-22 DIAGNOSIS — R600 Localized edema: Secondary | ICD-10-CM | POA: Diagnosis not present

## 2018-08-22 NOTE — Therapy (Signed)
[facility] [place], [place], [address] Phone: [phone]   Fax:  [phone]  Physical Therapy Treatment  Patient Details  Name: Traci Johnson MRN: [id number] Date of Birth: 04/01/1951 Referring Provider (PT): Jaynee Eagles MD.   Encounter Date: 08/22/2018  PT End of Session - 08/22/18 1421    Visit Number  5    Number of Visits  12    Date for PT Re-Evaluation  09/19/18    Authorization Type  FOTO AT LEAST EVERY 5TH VISIT.  KX MODIFIER AFTER 15 VISITS.  PROGRESS NOTE AT 10TH VISIT.    PT Start Time  1345    PT Stop Time  1436    PT Time Calculation (min)  51 min    Activity Tolerance  Patient tolerated treatment well    Behavior During Therapy  WFL for tasks assessed/performed       Past Medical History:  Diagnosis Date   Allergy    Asthma    Hypertension    Migraines    cluster    Past Surgical History:  Procedure Laterality Date   ABDOMINAL HYSTERECTOMY  1985   CHOLECYSTECTOMY N/A 05/02/2017   Procedure: LAPAROSCOPIC CHOLECYSTECTOMY;  Surgeon: Mickeal Skinner, MD;  Location: [facility];  Service: General;  Laterality: N/A;   LEFT HEART CATH AND CORONARY ANGIOGRAPHY N/A 04/30/2017   Procedure: LEFT HEART CATH AND CORONARY ANGIOGRAPHY;  Surgeon: Lorretta Harp, MD;  Location: [facility];  Service: Cardiovascular;  Laterality: N/A;   LIGAMENT REPAIR Right    birth defect    There were no vitals filed for this visit.  Subjective Assessment - 08/22/18 1347    Subjective  Patient arrived "feeling good today" did ok after ionto patch per patient yet noticed small red patch from it    Pertinent History  Right knee surgery in 1974 to "relocate kneecap"; HTN, right knee scope.    Limitations  Walking    How long can you sit comfortably?  None    How long can you stand comfortably?  None    How long can you walk comfortably?  Short community distance.    Patient Stated Goals  Perform stairs  without pain.    Currently in Pain?  Yes    Pain Score  2     Pain Location  Knee    Pain Orientation  Right    Pain Descriptors / Indicators  Sore    Pain Type  Chronic pain    Pain Onset  More than a month ago    Pain Frequency  Intermittent    Aggravating Factors   any prolong activity    Pain Relieving Factors  at rest         [facility] PT Assessment - 08/22/18 0001      AROM   AROM Assessment Site  Knee    Right/Left Knee  Right    Right Knee Extension  -2    Right Knee Flexion  120                   [facility] Adult PT Treatment/Exercise - 08/22/18 0001      Knee/Hip Exercises: Aerobic   Nustep  49min UE/LE L5      Knee/Hip Exercises: Standing   Forward Step Up  Right;3 sets;10 reps;Step Height: 6"    Step Down  Right;2 sets;10 reps;Step Height: 6"    Rocker Board  2 minutes      Knee/Hip  Exercises: Supine   Other Supine Knee/Hip Exercises  clam with red t-band      Moist Heat Therapy   Number Minutes Moist Heat  15 Minutes    Moist Heat Location  Knee      Electrical Stimulation   Electrical Stimulation Location  Right lateral knee.    Electrical Stimulation Action  premod    Electrical Stimulation Parameters  80-150hz  x40min    Electrical Stimulation Goals  Pain      Iontophoresis   Type of Iontophoresis  Dexamethasone    Location  Right lateral knee.    Dose  80 mA-min 2 of 6    Time  8               PT Short Term Goals - 08/08/18 1714      PT SHORT TERM GOAL #1   Title  STG's=LTG's        PT Long Term Goals - 08/22/18 1410      PT LONG TERM GOAL #1   Title  Independent with a HEP.    Time  6    Period  Weeks    Status  On-going      PT LONG TERM GOAL #2   Title  Full right active knee extension in order to normalize gait.    Time  6    Period  Weeks    Status  On-going   AROM -2 degrees 08/22/18     PT LONG TERM GOAL #3   Title  Increase right knee/hip strength to a solid 5/5 to provide good stability for  accomplishment of functional activities.    Time  6    Period  Weeks    Status  On-going   NT 08/22/18     PT LONG TERM GOAL #4   Title  Perform a reciprocating stair gait with one railing with pain not > 2-3/10.    Time  6    Period  Weeks    Status  On-going   NT 08/22/18     PT LONG TERM GOAL #5   Title  Walk a community distance without assistive device and pain not > 2-3/10.    Time  6    Period  Weeks    Status  On-going            Plan - 08/22/18 1423    Clinical Impression Statement  Patient tolerated treatment well today and able to progress with exercises. Patient reported able to perform light ADL's with some ongoing discomfort and weakness. Patient responded well from ionto with small dime size light red patch yet PT said ok to try again today and move slightly. Patient progressing with ROM yet goal ongoing.     Personal Factors and Comorbidities  Past/Current Experience    Stability/Clinical Decision Making  Stable/Uncomplicated    Rehab Potential  Excellent    PT Frequency  2x / week    PT Duration  6 weeks    PT Treatment/Interventions  ADLs/Self Care Home Management;Cryotherapy;Electrical Stimulation;Iontophoresis 4mg /ml Dexamethasone;Therapeutic exercise;Therapeutic activities;Functional mobility training;Patient/family education;Manual techniques;Passive range of motion;Vasopneumatic Device    PT Next Visit Plan  aseess ionto then Focus on step ups/downs, eccentric quads and hip weakness    Consulted and Agree with Plan of Care  Patient       Patient will benefit from skilled therapeutic intervention in order to improve the following deficits and impairments:  Pain, Decreased activity tolerance, Decreased strength, Decreased range of  motion  Visit Diagnosis: Acute pain of right knee  Stiffness of right knee, not elsewhere classified  Muscle weakness (generalized)     Problem List Patient Active Problem List   Diagnosis Date Noted   Encounter for  well woman exam with routine gynecological exam 03/25/2018   Screening for colorectal cancer 03/25/2018   Fecal occult blood test positive 03/25/2018   Current use of estrogen therapy 03/25/2018   Orthostatic hypotension 05/03/2017   Syncope due to orthostatic hypotension 05/03/2017   Symptomatic cholelithiasis 05/02/2017   Acute cholecystitis 05/02/2017   Abnormal nuclear stress test    Chest pain 04/27/2017   Hyperglycemia 04/27/2017   Anemia 04/27/2017   Essential hypertension 07/15/2014   Mild persistent asthma without complication 12/14/1973   Vitamin D deficiency 07/15/2014    Rudine Rieger P, PTA 08/22/2018, 2:38 PM  [facility] [address] [place], [place], [address] Phone: [phone]   Fax:  [phone]  Name: Traci Johnson MRN: [id number] Date of Birth: 1951-05-16

## 2018-08-26 ENCOUNTER — Ambulatory Visit: Payer: BLUE CROSS/BLUE SHIELD | Admitting: Physical Therapy

## 2018-08-28 ENCOUNTER — Telehealth: Payer: Self-pay | Admitting: Physical Therapy

## 2018-08-29 ENCOUNTER — Encounter: Payer: BLUE CROSS/BLUE SHIELD | Admitting: Physical Therapy

## 2018-09-02 ENCOUNTER — Ambulatory Visit: Payer: BLUE CROSS/BLUE SHIELD | Admitting: Physical Therapy

## 2018-09-02 ENCOUNTER — Other Ambulatory Visit: Payer: Self-pay

## 2018-09-02 DIAGNOSIS — R600 Localized edema: Secondary | ICD-10-CM | POA: Diagnosis not present

## 2018-09-02 DIAGNOSIS — M25661 Stiffness of right knee, not elsewhere classified: Secondary | ICD-10-CM

## 2018-09-02 DIAGNOSIS — M25561 Pain in right knee: Secondary | ICD-10-CM

## 2018-09-02 DIAGNOSIS — M6281 Muscle weakness (generalized): Secondary | ICD-10-CM | POA: Diagnosis not present

## 2018-09-02 NOTE — Therapy (Signed)
[facility] [place], [place], [address] Phone: [phone]   Fax:  [phone]  Physical Therapy Treatment  Patient Details  Name: Traci Johnson MRN: [id number] Date of Birth: April 17, 1951 Referring Provider (PT): Jaynee Eagles MD.   Encounter Date: 09/02/2018  PT End of Session - 09/02/18 1003    Visit Number  6    Number of Visits  12    Date for PT Re-Evaluation  09/19/18    Authorization Type  FOTO AT LEAST EVERY 5TH VISIT.  KX MODIFIER AFTER 15 VISITS.  PROGRESS NOTE AT 10TH VISIT.    PT Start Time  0945    PT Stop Time  1045    PT Time Calculation (min)  60 min    Activity Tolerance  Patient tolerated treatment well    Behavior During Therapy  WFL for tasks assessed/performed       Past Medical History:  Diagnosis Date   Allergy    Asthma    Hypertension    Migraines    cluster    Past Surgical History:  Procedure Laterality Date   ABDOMINAL HYSTERECTOMY  1985   CHOLECYSTECTOMY N/A 05/02/2017   Procedure: LAPAROSCOPIC CHOLECYSTECTOMY;  Surgeon: Mickeal Skinner, MD;  Location: [facility];  Service: General;  Laterality: N/A;   LEFT HEART CATH AND CORONARY ANGIOGRAPHY N/A 04/30/2017   Procedure: LEFT HEART CATH AND CORONARY ANGIOGRAPHY;  Surgeon: Lorretta Harp, MD;  Location: [facility];  Service: Cardiovascular;  Laterality: N/A;   LIGAMENT REPAIR Right    birth defect    There were no vitals filed for this visit.  Subjective Assessment - 09/02/18 1000    Subjective  COVID-19  screen performed prior to patient entering clinic.  Patient CC today is not being able to perform stairs like she would like to.    Pertinent History  Right knee surgery in 1974 to "relocate kneecap"; HTN, right knee scope.    Limitations  Walking    How long can you sit comfortably?  None    How long can you stand comfortably?  None    How long can you walk comfortably?  Short community distance.    Patient  Stated Goals  Perform stairs without pain.    Currently in Pain?  Yes    Pain Score  2     Pain Location  Knee    Pain Orientation  Right    Pain Descriptors / Indicators  Sore    Pain Type  Chronic pain    Pain Onset  More than a month ago                       [facility] Adult PT Treatment/Exercise - 09/02/18 0001      Exercises   Exercises  Knee/Hip      Knee/Hip Exercises: Aerobic   Nustep  Level 5 x 15 minutes with a cadenece at > 70 steps per minutes.      Knee/Hip Exercises: Machines for Strengthening   Cybex Leg Press  2 plates (pain-free range of motion) x 4 minutes.      Knee/Hip Exercises: Standing   Other Standing Knee Exercises  6 inch steps-ups/down performing with right and left LE leading (4 minutes).      Modalities   Modalities  Electrical Stimulation;Moist Heat      Moist Heat Therapy   Number Minutes Moist Heat  20 Minutes    Moist Heat Location  --  Right knee.     Acupuncturist Location  Right lateral knee.    Electrical Stimulation Action  Pre-mod.    Electrical Stimulation Parameters  80-150 Hz x 20 minutes.    Electrical Stimulation Goals  Pain      Iontophoresis   Type of Iontophoresis  Dexamethasone    Location  Right lateral knee.    Dose  80 mA-min 3 of 6.    Time  --   8.              PT Short Term Goals - 08/08/18 1714      PT SHORT TERM GOAL #1   Title  STG's=LTG's        PT Long Term Goals - 08/22/18 1410      PT LONG TERM GOAL #1   Title  Independent with a HEP.    Time  6    Period  Weeks    Status  On-going      PT LONG TERM GOAL #2   Title  Full right active knee extension in order to normalize gait.    Time  6    Period  Weeks    Status  On-going   AROM -2 degrees 08/22/18     PT LONG TERM GOAL #3   Title  Increase right knee/hip strength to a solid 5/5 to provide good stability for accomplishment of functional activities.    Time  6    Period  Weeks     Status  On-going   NT 08/22/18     PT LONG TERM GOAL #4   Title  Perform a reciprocating stair gait with one railing with pain not > 2-3/10.    Time  6    Period  Weeks    Status  On-going   NT 08/22/18     PT LONG TERM GOAL #5   Title  Walk a community distance without assistive device and pain not > 2-3/10.    Time  6    Period  Weeks    Status  On-going            Plan - 09/02/18 1054    Clinical Impression Statement  Patient did well today.  She did well with a 6 inch step in parallel bars but lacks confidence without rail support.  Not able to perform a reciprocating stair gait at this time.  She continues to be palpably tender at her right distal/lateral knee.    Personal Factors and Comorbidities  Past/Current Experience    Stability/Clinical Decision Making  Stable/Uncomplicated    PT Treatment/Interventions  ADLs/Self Care Home Management;Cryotherapy;Electrical Stimulation;Iontophoresis 4mg /ml Dexamethasone;Therapeutic exercise;Therapeutic activities;Functional mobility training;Patient/family education;Manual techniques;Passive range of motion;Vasopneumatic Device    PT Next Visit Plan  aseess ionto then Focus on step ups/downs, eccentric quads and hip weakness    Consulted and Agree with Plan of Care  Patient       Patient will benefit from skilled therapeutic intervention in order to improve the following deficits and impairments:  Pain, Decreased activity tolerance, Decreased strength, Decreased range of motion  Visit Diagnosis: Acute pain of right knee  Stiffness of right knee, not elsewhere classified     Problem List Patient Active Problem List   Diagnosis Date Noted   Encounter for well woman exam with routine gynecological exam 03/25/2018   Screening for colorectal cancer 03/25/2018   Fecal occult blood test positive 03/25/2018   Current use of estrogen  therapy 03/25/2018   Orthostatic hypotension 05/03/2017   Syncope due to orthostatic  hypotension 05/03/2017   Symptomatic cholelithiasis 05/02/2017   Acute cholecystitis 05/02/2017   Abnormal nuclear stress test    Chest pain 04/27/2017   Hyperglycemia 04/27/2017   Anemia 04/27/2017   Essential hypertension 07/15/2014   Mild persistent asthma without complication 83/25/4982   Vitamin D deficiency 07/15/2014    Aryan Bello, [place] MPT 09/02/2018, 10:57 AM  [facility] [address] [place], [place], [address] Phone: [phone]   Fax:  [phone]  Name: Alaysiah Browder MRN: [id number] Date of Birth: Oct 29, 1950

## 2018-09-04 NOTE — Telephone Encounter (Signed)
No follow up required.

## 2018-09-05 ENCOUNTER — Ambulatory Visit: Payer: BLUE CROSS/BLUE SHIELD | Attending: Orthopedic Surgery | Admitting: Physical Therapy

## 2018-09-05 ENCOUNTER — Encounter: Payer: Self-pay | Admitting: Physical Therapy

## 2018-09-05 ENCOUNTER — Other Ambulatory Visit: Payer: Self-pay

## 2018-09-05 DIAGNOSIS — M6281 Muscle weakness (generalized): Secondary | ICD-10-CM | POA: Diagnosis not present

## 2018-09-05 DIAGNOSIS — R600 Localized edema: Secondary | ICD-10-CM

## 2018-09-05 DIAGNOSIS — M25661 Stiffness of right knee, not elsewhere classified: Secondary | ICD-10-CM

## 2018-09-05 DIAGNOSIS — M25561 Pain in right knee: Secondary | ICD-10-CM | POA: Diagnosis not present

## 2018-09-05 NOTE — Therapy (Signed)
[facility] [place], [place], [address] Phone: [phone]   Fax:  [phone]  Physical Therapy Treatment  Patient Details  Name: Traci Johnson MRN: [id number] Date of Birth: 08/23/1950 Referring Provider (PT): Jaynee Eagles MD.   Encounter Date: 09/05/2018  PT End of Session - 09/05/18 1312    Visit Number  7    Number of Visits  12    Date for PT Re-Evaluation  09/19/18    Authorization Type  FOTO AT LEAST EVERY 5TH VISIT.  KX MODIFIER AFTER 15 VISITS.  PROGRESS NOTE AT 10TH VISIT.    PT Start Time  1300    PT Stop Time  1354    PT Time Calculation (min)  54 min    Activity Tolerance  Patient tolerated treatment well    Behavior During Therapy  WFL for tasks assessed/performed       Past Medical History:  Diagnosis Date   Allergy    Asthma    Hypertension    Migraines    cluster    Past Surgical History:  Procedure Laterality Date   ABDOMINAL HYSTERECTOMY  1985   CHOLECYSTECTOMY N/A 05/02/2017   Procedure: LAPAROSCOPIC CHOLECYSTECTOMY;  Surgeon: Mickeal Skinner, MD;  Location: [facility];  Service: General;  Laterality: N/A;   LEFT HEART CATH AND CORONARY ANGIOGRAPHY N/A 04/30/2017   Procedure: LEFT HEART CATH AND CORONARY ANGIOGRAPHY;  Surgeon: Lorretta Harp, MD;  Location: [facility];  Service: Cardiovascular;  Laterality: N/A;   LIGAMENT REPAIR Right    birth defect    There were no vitals filed for this visit.  Subjective Assessment - 09/05/18 1300    Subjective  Reports that she has been walking this morning and has aggravated this morning. COVID 19 screening performed on patient prior to entering building.    Pertinent History  Right knee surgery in 1974 to "relocate kneecap"; HTN, right knee scope.    Limitations  Walking    How long can you sit comfortably?  None    How long can you stand comfortably?  None    How long can you walk comfortably?  Short community distance.     Patient Stated Goals  Perform stairs without pain.    Currently in Pain?  No/denies         [facility] PT Assessment - 09/05/18 0001      Assessment   Medical Diagnosis  S/p right knee unicompartmental arhtroplasty    Onset Date/Surgical Date  05/09/18    Next MD Visit  09/2018      Restrictions   Weight Bearing Restrictions  No                   [facility] Adult PT Treatment/Exercise - 09/05/18 0001      Ambulation/Gait   Stairs  Yes    Stairs Assistance  6: Modified independent (Device/Increase time)    Stair Management Technique  One rail Right;Alternating pattern;Step to pattern;Forwards    Number of Stairs  4   x3 RT   Height of Stairs  6.5      Knee/Hip Exercises: Aerobic   Nustep  Level 5 x 15 minutes with a cadenece at > 70 steps per minutes.      Knee/Hip Exercises: Machines for Strengthening   Cybex Knee Extension  10# x15 reps stopped due to pain    Cybex Knee Flexion  30# x30 reps    Cybex Leg Press  2 plates (  pain-free range of motion) x30 reps      Knee/Hip Exercises: Standing   Hip Abduction  AROM;Right;20 reps;Knee straight    Forward Step Up  Right;2 sets;10 reps;Hand Hold: 2;Step Height: 6"    Step Down  Right;20 reps;Hand Hold: 2;Step Height: 4"   heel dot   Functional Squat  2 sets;10 reps      Modalities   Modalities  Electrical Stimulation;Vasopneumatic      Electrical Stimulation   Electrical Stimulation Location  R knee    Electrical Stimulation Action  Pre-mod    Electrical Stimulation Parameters  80-150 hz x15 min    Electrical Stimulation Goals  Pain      Vasopneumatic   Number Minutes Vasopneumatic   15 minutes    Vasopnuematic Location   Knee    Vasopneumatic Pressure  Medium    Vasopneumatic Temperature   34               PT Short Term Goals - 08/08/18 1714      PT SHORT TERM GOAL #1   Title  STG's=LTG's        PT Long Term Goals - 08/22/18 1410      PT LONG TERM GOAL #1   Title  Independent with a HEP.     Time  6    Period  Weeks    Status  On-going      PT LONG TERM GOAL #2   Title  Full right active knee extension in order to normalize gait.    Time  6    Period  Weeks    Status  On-going   AROM -2 degrees 08/22/18     PT LONG TERM GOAL #3   Title  Increase right knee/hip strength to a solid 5/5 to provide good stability for accomplishment of functional activities.    Time  6    Period  Weeks    Status  On-going   NT 08/22/18     PT LONG TERM GOAL #4   Title  Perform a reciprocating stair gait with one railing with pain not > 2-3/10.    Time  6    Period  Weeks    Status  On-going   NT 08/22/18     PT LONG TERM GOAL #5   Title  Walk a community distance without assistive device and pain not > 2-3/10.    Time  6    Period  Weeks    Status  On-going            Plan - 09/05/18 1343    Clinical Impression Statement  Patient presented in clinic with no current pain but reported irritation of R lateral knee due to increased ambulation today. Patient limited with stair ambulation secondary to mixture of fear/habit, strength and control deficits in outdoor stair setting. More control and less deficits noted with practice of step ups while in clinc setting but BUE support utilized in parallel bars. VCs required for initial directions of therex and further VCs and demo required for squat technique. Patient does report intermittant discomfort in R lateral knee due to bursa discomfort per patient report. Normal modalities response noted following removal of the modalities.    Personal Factors and Comorbidities  Past/Current Experience    Stability/Clinical Decision Making  Stable/Uncomplicated    Rehab Potential  Excellent    PT Frequency  2x / week    PT Duration  6 weeks    PT Treatment/Interventions  ADLs/Self Care Home Management;Cryotherapy;Electrical Stimulation;Iontophoresis 4mg /ml Dexamethasone;Therapeutic exercise;Therapeutic activities;Functional mobility  training;Patient/family education;Manual techniques;Passive range of motion;Vasopneumatic Device    PT Next Visit Plan  Focus on concentric and eccentric strengthening and control for steps per POC.    Consulted and Agree with Plan of Care  Patient       Patient will benefit from skilled therapeutic intervention in order to improve the following deficits and impairments:  Pain, Decreased activity tolerance, Decreased strength, Decreased range of motion  Visit Diagnosis: Acute pain of right knee  Stiffness of right knee, not elsewhere classified  Muscle weakness (generalized)  Localized edema     Problem List Patient Active Problem List   Diagnosis Date Noted   Encounter for well woman exam with routine gynecological exam 03/25/2018   Screening for colorectal cancer 03/25/2018   Fecal occult blood test positive 03/25/2018   Current use of estrogen therapy 03/25/2018   Orthostatic hypotension 05/03/2017   Syncope due to orthostatic hypotension 05/03/2017   Symptomatic cholelithiasis 05/02/2017   Acute cholecystitis 05/02/2017   Abnormal nuclear stress test    Chest pain 04/27/2017   Hyperglycemia 04/27/2017   Anemia 04/27/2017   Essential hypertension 07/15/2014   Mild persistent asthma without complication 68/01/8109   Vitamin D deficiency 07/15/2014    Standley Brooking, PTA 09/05/2018, 2:14 PM  [facility] [address] [place], [place], [address] Phone: [phone]   Fax:  [phone]  Name: Traci Johnson MRN: [id number] Date of Birth: [birth year]

## 2018-09-09 ENCOUNTER — Ambulatory Visit: Payer: BLUE CROSS/BLUE SHIELD | Admitting: Physical Therapy

## 2018-09-09 ENCOUNTER — Encounter: Payer: Self-pay | Admitting: Physical Therapy

## 2018-09-09 ENCOUNTER — Other Ambulatory Visit: Payer: Self-pay

## 2018-09-09 DIAGNOSIS — M6281 Muscle weakness (generalized): Secondary | ICD-10-CM | POA: Diagnosis not present

## 2018-09-09 DIAGNOSIS — R600 Localized edema: Secondary | ICD-10-CM

## 2018-09-09 DIAGNOSIS — M25661 Stiffness of right knee, not elsewhere classified: Secondary | ICD-10-CM

## 2018-09-09 DIAGNOSIS — M25561 Pain in right knee: Secondary | ICD-10-CM | POA: Diagnosis not present

## 2018-09-09 NOTE — Therapy (Signed)
[facility] [address] [place], [place], [address] Phone: [phone]   Fax:  [phone]  Physical Therapy Treatment  Patient Details  Name: Traci Johnson MRN: [id number] Date of Birth: 1951-04-21 Referring Provider (PT): Darnell Level MD.   Encounter Date: 09/09/2018  PT End of Session - 09/09/18 1128    Visit Number  8    Number of Visits  12    Date for PT Re-Evaluation  09/19/18    Authorization Type  FOTO AT LEAST EVERY 5TH VISIT.  KX MODIFIER AFTER 15 VISITS.  PROGRESS NOTE AT 10TH VISIT.    PT Start Time  1120    PT Stop Time  1215    PT Time Calculation (min)  55 min    Activity Tolerance  Patient tolerated treatment well    Behavior During Therapy  WFL for tasks assessed/performed       Past Medical History:  Diagnosis Date  . Allergy   . Asthma   . Hypertension   . Migraines    cluster    Past Surgical History:  Procedure Laterality Date  . ABDOMINAL HYSTERECTOMY  1985  . CHOLECYSTECTOMY N/A 05/02/2017   Procedure: LAPAROSCOPIC CHOLECYSTECTOMY;  Surgeon: Kinsinger, De Blanch, MD;  Location: [facility];  Service: General;  Laterality: N/A;  . LEFT HEART CATH AND CORONARY ANGIOGRAPHY N/A 04/30/2017   Procedure: LEFT HEART CATH AND CORONARY ANGIOGRAPHY;  Surgeon: Runell Gess, MD;  Location: [facility];  Service: Cardiovascular;  Laterality: N/A;  . LIGAMENT REPAIR Right    birth defect    There were no vitals filed for this visit.  Subjective Assessment - 09/09/18 1121    Subjective  Patient reports that she goes to the MD next monday. Has been using her hot tub at home. Reports that lateral bursa is still very tender but not during ambulation. COVID 19 screening performed prior to entering building.    Pertinent History  Right knee surgery in 1974 to "relocate kneecap"; HTN, right knee scope.    Limitations  Walking    How long can you sit comfortably?  None    How long can you stand comfortably?   None    How long can you walk comfortably?  Short community distance.    Patient Stated Goals  Perform stairs without pain.    Currently in Pain?  Yes    Pain Score  7     Pain Location  Knee    Pain Orientation  Right;Lateral    Pain Descriptors / Indicators  Tender    Pain Type  Chronic pain    Pain Onset  More than a month ago    Pain Frequency  Intermittent         [facility] PT Assessment - 09/09/18 0001      Assessment   Medical Diagnosis  S/p right knee unicompartmental arhtroplasty    Onset Date/Surgical Date  05/09/18    Next MD Visit  09/16/2018      Restrictions   Weight Bearing Restrictions  No      ROM / Strength   AROM / PROM / Strength  AROM      AROM   Overall AROM   Within functional limits for tasks performed    AROM Assessment Site  Knee    Right/Left Knee  Right    Right Knee Extension  2    Right Knee Flexion  127  [facility] Adult PT Treatment/Exercise - 09/09/18 0001      Knee/Hip Exercises: Aerobic   Nustep  Level 5 x 15 minutes with a cadenece at > 70 steps per minutes.      Knee/Hip Exercises: Standing   Terminal Knee Extension  Strengthening;Right;20 reps;Theraband    Theraband Level (Terminal Knee Extension)  Level 3 (Green)    Lateral Step Up  Right;2 sets;10 reps;Hand Hold: 2;Step Height: 6"    Forward Step Up  Right;2 sets;10 reps;Hand Hold: 2;Step Height: 6"    Step Down  Right;20 reps;Hand Hold: 2;Step Height: 4"      Knee/Hip Exercises: Supine   Straight Leg Raises  AROM;Right;3 sets;10 reps    Other Supine Knee/Hip Exercises  B hip clam green theraband x30 reps      Modalities   Modalities  Electrical Stimulation;Vasopneumatic      Electrical Stimulation   Electrical Stimulation Location  R lateral knee    Electrical Stimulation Action  Pre-Mod    Electrical Stimulation Parameters  80-150 hz x15 min    Electrical Stimulation Goals  Pain      Vasopneumatic   Number Minutes Vasopneumatic   15 minutes     Vasopnuematic Location   Knee    Vasopneumatic Pressure  Medium    Vasopneumatic Temperature   34               PT Short Term Goals - 08/08/18 1714      PT SHORT TERM GOAL #1   Title  STG's=LTG's        PT Long Term Goals - 08/22/18 1410      PT LONG TERM GOAL #1   Title  Independent with a HEP.    Time  6    Period  Weeks    Status  On-going      PT LONG TERM GOAL #2   Title  Full right active knee extension in order to normalize gait.    Time  6    Period  Weeks    Status  On-going   AROM -2 degrees 08/22/18     PT LONG TERM GOAL #3   Title  Increase right knee/hip strength to a solid 5/5 to provide good stability for accomplishment of functional activities.    Time  6    Period  Weeks    Status  On-going   NT 08/22/18     PT LONG TERM GOAL #4   Title  Perform a reciprocating stair gait with one railing with pain not > 2-3/10.    Time  6    Period  Weeks    Status  On-going   NT 08/22/18     PT LONG TERM GOAL #5   Title  Walk a community distance without assistive device and pain not > 2-3/10.    Time  6    Period  Weeks    Status  On-going            Plan - 09/09/18 1221    Clinical Impression Statement  Patient presented in clinic with reports of intermittant lateral R knee pain from bursitis. Patient being active at home per patient report. More strengthening and control exercises completed today with increased VCs required for step ups and TKE. AROM of R knee measured as 2-127 deg with discomfort at end range flexion. Normal modalities response noted following removal of the modalities.    Personal Factors and Comorbidities  Past/Current Experience  Stability/Clinical Decision Making  Stable/Uncomplicated    Rehab Potential  Excellent    PT Frequency  2x / week    PT Duration  6 weeks    PT Treatment/Interventions  ADLs/Self Care Home Management;Cryotherapy;Electrical Stimulation;Iontophoresis 4mg /ml Dexamethasone;Therapeutic  exercise;Therapeutic activities;Functional mobility training;Patient/family education;Manual techniques;Passive range of motion;Vasopneumatic Device    PT Next Visit Plan  Focus on concentric and eccentric strengthening and control for steps per POC. MD note required for Monday. Make HEP of SLR, clam, TKE.    Consulted and Agree with Plan of Care  Patient       Patient will benefit from skilled therapeutic intervention in order to improve the following deficits and impairments:  Pain, Decreased activity tolerance, Decreased strength, Decreased range of motion  Visit Diagnosis: Acute pain of right knee  Stiffness of right knee, not elsewhere classified  Muscle weakness (generalized)  Localized edema     Problem List Patient Active Problem List   Diagnosis Date Noted  . Encounter for well woman exam with routine gynecological exam 03/25/2018  . Screening for colorectal cancer 03/25/2018  . Fecal occult blood test positive 03/25/2018  . Current use of estrogen therapy 03/25/2018  . Orthostatic hypotension 05/03/2017  . Syncope due to orthostatic hypotension 05/03/2017  . Symptomatic cholelithiasis 05/02/2017  . Acute cholecystitis 05/02/2017  . Abnormal nuclear stress test   . Chest pain 04/27/2017  . Hyperglycemia 04/27/2017  . Anemia 04/27/2017  . Essential hypertension 07/15/2014  . Mild persistent asthma without complication 07/15/2014  . Vitamin D deficiency 07/15/2014    Marvell Fuller, PTA 09/09/2018, 12:23 PM  [facility] [address] [place], [place], [address] Phone: [phone]   Fax:  [phone]  Name: Traci Johnson MRN: [id number] Date of Birth: 05/17/1951

## 2018-09-12 ENCOUNTER — Other Ambulatory Visit: Payer: Self-pay

## 2018-09-12 ENCOUNTER — Encounter: Payer: Self-pay | Admitting: Physical Therapy

## 2018-09-12 ENCOUNTER — Ambulatory Visit: Payer: BLUE CROSS/BLUE SHIELD | Admitting: Physical Therapy

## 2018-09-12 DIAGNOSIS — M25661 Stiffness of right knee, not elsewhere classified: Secondary | ICD-10-CM

## 2018-09-12 DIAGNOSIS — R600 Localized edema: Secondary | ICD-10-CM | POA: Diagnosis not present

## 2018-09-12 DIAGNOSIS — M6281 Muscle weakness (generalized): Secondary | ICD-10-CM | POA: Diagnosis not present

## 2018-09-12 DIAGNOSIS — M25561 Pain in right knee: Secondary | ICD-10-CM

## 2018-09-12 NOTE — Therapy (Addendum)
[facility] [place], [place], [address] Phone: [phone]   Fax:  [phone]  Physical Therapy Treatment  Patient Details  Name: Traci Johnson MRN: [id number] Date of Birth: [birth year] Referring Provider (PT): Jaynee Eagles MD.   Encounter Date: 09/12/2018  PT End of Session - 09/12/18 1312    Visit Number  9    Number of Visits  12    Date for PT Re-Evaluation  09/19/18    Authorization Type  FOTO AT LEAST EVERY 5TH VISIT.  KX MODIFIER AFTER 15 VISITS.  PROGRESS NOTE AT 10TH VISIT.    PT Start Time  1305    PT Stop Time  1400    PT Time Calculation (min)  55 min    Activity Tolerance  Patient tolerated treatment well    Behavior During Therapy  WFL for tasks assessed/performed       Past Medical History:  Diagnosis Date   Allergy    Asthma    Hypertension    Migraines    cluster    Past Surgical History:  Procedure Laterality Date   ABDOMINAL HYSTERECTOMY  1985   CHOLECYSTECTOMY N/A 05/02/2017   Procedure: LAPAROSCOPIC CHOLECYSTECTOMY;  Surgeon: Mickeal Skinner, MD;  Location: [facility];  Service: General;  Laterality: N/A;   LEFT HEART CATH AND CORONARY ANGIOGRAPHY N/A 04/30/2017   Procedure: LEFT HEART CATH AND CORONARY ANGIOGRAPHY;  Surgeon: Lorretta Harp, MD;  Location: [facility];  Service: Cardiovascular;  Laterality: N/A;   LIGAMENT REPAIR Right    birth defect    There were no vitals filed for this visit.  Subjective Assessment - 09/12/18 1306    Subjective  Reports that she goes to MD on Monday. COVID 19 screening performed on patient prior to entering building.    Pertinent History  Right knee surgery in 1974 to "relocate kneecap"; HTN, right knee scope.    Limitations  Walking    How long can you sit comfortably?  None    How long can you stand comfortably?  None    How long can you walk comfortably?  Short community distance.    Patient Stated Goals  Perform stairs  without pain.    Currently in Pain?  No/denies         [facility] PT Assessment - 09/12/18 0001      Assessment   Medical Diagnosis  S/p right knee unicompartmental arhtroplasty    Onset Date/Surgical Date  05/09/18    Next MD Visit  09/16/2018      Restrictions   Weight Bearing Restrictions  No      ROM / Strength   AROM / PROM / Strength  AROM;Strength      AROM   Overall AROM   Within functional limits for tasks performed    AROM Assessment Site  Knee    Right/Left Knee  Right    Right Knee Extension  0    Right Knee Flexion  130      Strength   Overall Strength  Within functional limits for tasks performed    Strength Assessment Site  Knee    Right/Left Knee  Right    Right Knee Flexion  4+/5    Right Knee Extension  5/5                   [facility] Adult PT Treatment/Exercise - 09/12/18 0001      Knee/Hip Exercises: Aerobic   Nustep  L6,  seat 7 x16 min      Knee/Hip Exercises: Standing   Terminal Knee Extension  Strengthening;Right;20 reps;Theraband    Theraband Level (Terminal Knee Extension)  Level 3 (Green)    Lateral Step Up  Right;2 sets;10 reps;Hand Hold: 2;Step Height: 6"    Forward Step Up  Right;2 sets;10 reps;Hand Hold: 2;Step Height: 6"    Step Down  Right;20 reps;Hand Hold: 2;Step Height: 4"      Modalities   Modalities  Health visitor Stimulation Location  R lateral knee    Electrical Stimulation Action  Pre-Mod    Electrical Stimulation Parameters  80-150 hz x15 min    Electrical Stimulation Goals  Pain      Vasopneumatic   Number Minutes Vasopneumatic   15 minutes    Vasopnuematic Location   Knee    Vasopneumatic Pressure  Medium    Vasopneumatic Temperature   34               PT Short Term Goals - 08/08/18 1714      PT SHORT TERM GOAL #1   Title  STG's=LTG's        PT Long Term Goals - 09/12/18 1340      PT LONG TERM GOAL #1   Title  Independent with a  HEP.    Time  6    Period  Weeks    Status  Unable to assess      PT LONG TERM GOAL #2   Title  Full right active knee extension in order to normalize gait.    Time  6    Period  Weeks    Status  Achieved      PT LONG TERM GOAL #3   Title  Increase right knee/hip strength to a solid 5/5 to provide good stability for accomplishment of functional activities.    Time  6    Period  Weeks    Status  Partially Met   NT 08/22/18     PT LONG TERM GOAL #4   Title  Perform a reciprocating stair gait with one railing with pain not > 2-3/10.    Time  6    Period  Weeks    Status  Not Met   NT 08/22/18     PT LONG TERM GOAL #5   Title  Walk a community distance without assistive device and pain not > 2-3/10.    Time  6    Period  Weeks    Status  Achieved            Plan - 09/12/18 1345    Clinical Impression Statement  Patient presented in clinic with no current R knee pain. Patient still habitual with stair gait as she had to change her step pattern years ago and also fear. MMT of R knee measured as 4+/5-5/5 and AROM of R knee measured as 0-130 deg. Patient able to achieve all goals except for stair goal and R MMT goal secondary to HS strength. Normal modalities response noted following removal of the modalities.    Personal Factors and Comorbidities  Past/Current Experience    Stability/Clinical Decision Making  Stable/Uncomplicated    Rehab Potential  Excellent    PT Frequency  2x / week    PT Duration  6 weeks    PT Treatment/Interventions  ADLs/Self Care Home Management;Cryotherapy;Electrical Stimulation;Iontophoresis 41m/ml Dexamethasone;Therapeutic exercise;Therapeutic activities;Functional mobility training;Patient/family education;Manual techniques;Passive range of motion;Vasopneumatic  Device    PT Next Visit Plan  MD note for appointment on 09/16/2018.    Consulted and Agree with Plan of Care  Patient       Patient will benefit from skilled therapeutic intervention in  order to improve the following deficits and impairments:  Pain, Decreased activity tolerance, Decreased strength, Decreased range of motion  Visit Diagnosis: Acute pain of right knee  Stiffness of right knee, not elsewhere classified  Muscle weakness (generalized)  Localized edema     Problem List Patient Active Problem List   Diagnosis Date Noted   Encounter for well woman exam with routine gynecological exam 03/25/2018   Screening for colorectal cancer 03/25/2018   Fecal occult blood test positive 03/25/2018   Current use of estrogen therapy 03/25/2018   Orthostatic hypotension 05/03/2017   Syncope due to orthostatic hypotension 05/03/2017   Symptomatic cholelithiasis 05/02/2017   Acute cholecystitis 05/02/2017   Abnormal nuclear stress test    Chest pain 04/27/2017   Hyperglycemia 04/27/2017   Anemia 04/27/2017   Essential hypertension 07/15/2014   Mild persistent asthma without complication 35/68/6168   Vitamin D deficiency 07/15/2014    Standley Brooking, PTA 09/12/18 2:13 PM   [facility] [address] [place], [place], [address] Phone: [phone]   Fax:  [phone]  Name: Traci Johnson MRN: [id number] Date of Birth: [birth year]  PHYSICAL THERAPY DISCHARGE SUMMARY  Visits from Start of Care: 9.  Current functional level related to goals / functional outcomes: See above.   Remaining deficits: See goal section.   Education / Equipment: HEP. Plan: Patient agrees to discharge.  Patient goals were partially met. Patient is being discharged due to being pleased with the current functional level.  ?????         Mali Applegate MPT

## 2018-09-16 DIAGNOSIS — M1711 Unilateral primary osteoarthritis, right knee: Secondary | ICD-10-CM | POA: Diagnosis not present

## 2018-09-20 ENCOUNTER — Telehealth: Payer: Self-pay | Admitting: Physical Therapy

## 2018-09-20 NOTE — Telephone Encounter (Signed)
Traci Johnson contacted regarding her recent MD appointment in which she notified PTA that she had been discharged from the MD and from PT.

## 2018-10-15 DIAGNOSIS — Z719 Counseling, unspecified: Secondary | ICD-10-CM | POA: Diagnosis not present

## 2018-12-10 DIAGNOSIS — M545 Low back pain: Secondary | ICD-10-CM | POA: Diagnosis not present

## 2018-12-16 ENCOUNTER — Other Ambulatory Visit: Payer: Self-pay | Admitting: Adult Health Nurse Practitioner

## 2018-12-16 ENCOUNTER — Other Ambulatory Visit (HOSPITAL_COMMUNITY): Payer: Self-pay | Admitting: Adult Health Nurse Practitioner

## 2018-12-16 DIAGNOSIS — R110 Nausea: Secondary | ICD-10-CM

## 2018-12-16 DIAGNOSIS — R319 Hematuria, unspecified: Secondary | ICD-10-CM | POA: Diagnosis not present

## 2018-12-16 DIAGNOSIS — M545 Low back pain: Secondary | ICD-10-CM | POA: Diagnosis not present

## 2018-12-20 ENCOUNTER — Ambulatory Visit (HOSPITAL_COMMUNITY)
Admission: RE | Admit: 2018-12-20 | Discharge: 2018-12-20 | Disposition: A | Discharge status: Home / Self Care | Payer: BC Managed Care – PPO | Source: Ambulatory Visit | Attending: Adult Health Nurse Practitioner | Admitting: Adult Health Nurse Practitioner

## 2018-12-20 ENCOUNTER — Other Ambulatory Visit: Payer: Self-pay

## 2018-12-20 DIAGNOSIS — R319 Hematuria, unspecified: Secondary | ICD-10-CM | POA: Insufficient documentation

## 2018-12-20 DIAGNOSIS — R110 Nausea: Secondary | ICD-10-CM | POA: Diagnosis not present

## 2018-12-24 ENCOUNTER — Other Ambulatory Visit: Payer: Self-pay | Admitting: Internal Medicine

## 2018-12-24 ENCOUNTER — Other Ambulatory Visit: Payer: Self-pay

## 2018-12-24 ENCOUNTER — Other Ambulatory Visit (HOSPITAL_COMMUNITY): Payer: Self-pay | Admitting: Internal Medicine

## 2018-12-24 ENCOUNTER — Ambulatory Visit (HOSPITAL_COMMUNITY)
Admission: RE | Admit: 2018-12-24 | Discharge: 2018-12-24 | Disposition: A | Discharge status: Home / Self Care | Payer: BC Managed Care – PPO | Source: Ambulatory Visit | Attending: Internal Medicine | Admitting: Internal Medicine

## 2018-12-24 DIAGNOSIS — R319 Hematuria, unspecified: Secondary | ICD-10-CM | POA: Diagnosis not present

## 2019-02-05 ENCOUNTER — Telehealth: Payer: Self-pay | Admitting: *Deleted

## 2019-02-05 ENCOUNTER — Other Ambulatory Visit: Payer: Self-pay | Admitting: Women's Health

## 2019-02-05 MED ORDER — ESTRADIOL 1 MG PO TABS: 1.0000 mg | ORAL_TABLET | Freq: Every day | ORAL | 0 refills | Status: DC

## 2019-02-05 NOTE — Telephone Encounter (Signed)
Patient called stating she has changed insurance and is requesting a 90 supply of Estradiol be sent to "[email or web address]" at [phone] as it is cheaper.

## 2019-02-21 DIAGNOSIS — E782 Mixed hyperlipidemia: Secondary | ICD-10-CM | POA: Diagnosis not present

## 2019-02-21 DIAGNOSIS — R7303 Prediabetes: Secondary | ICD-10-CM | POA: Diagnosis not present

## 2019-02-21 DIAGNOSIS — I10 Essential (primary) hypertension: Secondary | ICD-10-CM | POA: Diagnosis not present

## 2019-02-21 DIAGNOSIS — Z23 Encounter for immunization: Secondary | ICD-10-CM | POA: Diagnosis not present

## 2019-02-21 DIAGNOSIS — J302 Other seasonal allergic rhinitis: Secondary | ICD-10-CM | POA: Diagnosis not present

## 2019-04-25 DIAGNOSIS — R05 Cough: Secondary | ICD-10-CM | POA: Diagnosis not present

## 2019-04-25 DIAGNOSIS — R0981 Nasal congestion: Secondary | ICD-10-CM | POA: Diagnosis not present

## 2019-05-05 DIAGNOSIS — I10 Essential (primary) hypertension: Secondary | ICD-10-CM | POA: Diagnosis not present

## 2019-05-05 DIAGNOSIS — J4520 Mild intermittent asthma, uncomplicated: Secondary | ICD-10-CM | POA: Diagnosis not present

## 2019-05-06 ENCOUNTER — Encounter: Payer: Self-pay | Admitting: Adult Health

## 2019-05-06 ENCOUNTER — Ambulatory Visit (INDEPENDENT_AMBULATORY_CARE_PROVIDER_SITE_OTHER): Payer: Medicare Other | Admitting: Adult Health

## 2019-05-06 ENCOUNTER — Other Ambulatory Visit: Payer: Self-pay

## 2019-05-06 VITALS — BP 147/90 | HR 101 | Ht 64.0 in | Wt 198.0 lb

## 2019-05-06 DIAGNOSIS — Z1211 Encounter for screening for malignant neoplasm of colon: Secondary | ICD-10-CM | POA: Diagnosis not present

## 2019-05-06 DIAGNOSIS — Z01419 Encounter for gynecological examination (general) (routine) without abnormal findings: Secondary | ICD-10-CM | POA: Diagnosis not present

## 2019-05-06 DIAGNOSIS — Z1212 Encounter for screening for malignant neoplasm of rectum: Secondary | ICD-10-CM | POA: Diagnosis not present

## 2019-05-06 DIAGNOSIS — Z79899 Other long term (current) drug therapy: Secondary | ICD-10-CM

## 2019-05-06 LAB — HEMOCCULT GUIAC POC 1CARD (OFFICE): Fecal Occult Blood, POC: NEGATIVE

## 2019-05-06 MED ORDER — ESTRADIOL 1 MG PO TABS: 1.0000 mg | ORAL_TABLET | Freq: Every day | ORAL | 4 refills | Status: DC

## 2019-05-06 NOTE — Progress Notes (Signed)
Patient ID: Traci Johnson, female   DOB: 08/19/1950, 68 y.o.   MRN: [id number] History of Present Illness: Traci Johnson is a 68 year old white female, married, sp hysterectomy in for a well woman gyn exam and get estrace refilled. She has had partial knee replacement.  PCP is Dr Margo Aye.   Current Medications, Allergies, Past Medical History, Past Surgical History, Family History and Social History were reviewed in [employer] record.     Review of Systems: Patient denies any headaches, hearing loss, fatigue, blurred vision, shortness of breath, chest pain, abdominal pain, problems with bowel movements,(has IBS) urination, or intercourse(has infrequent). No joint pain or mood swings.    Physical Exam:BP (!) 147/90 (BP Location: Left Arm, Patient Position: Sitting, Cuff Size: Normal)   Pulse (!) 101   Ht 5\' 4"  (1.626 m)   Wt 198 lb (89.8 kg)   BMI 33.99 kg/m  General:  Well developed, well nourished, no acute distress Skin:  Warm and dry Neck:  Midline trachea, normal thyroid, good ROM, no lymphadenopathy,no carotid bruits heard. Lungs; Clear to auscultation bilaterally Breast:  No dominant palpable mass, retraction, or nipple discharge Cardiovascular: Regular rate and rhythm Abdomen:  Soft, non tender, no hepatosplenomegaly Pelvic:  External genitalia is normal in appearance, has multiple angiokeratomas.  The vagina is normal in appearance. Urethra has no lesions or masses. The cervix and uterus are absent.  No adnexal masses or tenderness noted.Bladder is non tender, no masses felt. Rectal: Good sphincter tone, no polyps, or hemorrhoids felt.  Hemoccult negative. Extremities/musculoskeletal:  No swelling or varicosities noted, no clubbing or cyanosis Psych:  No mood changes, alert and cooperative,seems happy Fall risk is low PHQ 2 score is 0. Examination chaperoned by Stoney Bang LPN.  Impression and Plan: 1. Encounter for well woman exam with routine  gynecological exam Physical in 1 year Mammogram yearly Labs with PCP  Get DEXA   2. Screening for colorectal cancer Colonoscopy per GI  3. Current use of estrogen therapy Will refill estrace Meds ordered this encounter  Medications  . estradiol (ESTRACE) 1 MG tablet    Sig: Take 1 tablet (1 mg total) by mouth daily.    Dispense:  90 tablet    Refill:  4    Order Specific Question:   Supervising Provider    Answer:   Duane Lope H [[id number]]

## 2019-05-21 ENCOUNTER — Other Ambulatory Visit (HOSPITAL_COMMUNITY): Payer: Self-pay | Admitting: Internal Medicine

## 2019-05-21 DIAGNOSIS — Z1231 Encounter for screening mammogram for malignant neoplasm of breast: Secondary | ICD-10-CM

## 2019-05-31 DIAGNOSIS — M79605 Pain in left leg: Secondary | ICD-10-CM | POA: Diagnosis not present

## 2019-06-01 DIAGNOSIS — I82412 Acute embolism and thrombosis of left femoral vein: Secondary | ICD-10-CM | POA: Diagnosis not present

## 2019-06-02 ENCOUNTER — Other Ambulatory Visit: Payer: Self-pay | Admitting: Internal Medicine

## 2019-06-02 ENCOUNTER — Other Ambulatory Visit: Payer: Self-pay

## 2019-06-02 ENCOUNTER — Ambulatory Visit (HOSPITAL_COMMUNITY)
Admission: RE | Admit: 2019-06-02 | Discharge: 2019-06-02 | Disposition: A | Discharge status: Home / Self Care | Payer: Medicare Other | Source: Ambulatory Visit | Attending: Internal Medicine | Admitting: Internal Medicine

## 2019-06-02 DIAGNOSIS — I824Y2 Acute embolism and thrombosis of unspecified deep veins of left proximal lower extremity: Secondary | ICD-10-CM

## 2019-06-02 DIAGNOSIS — M79662 Pain in left lower leg: Secondary | ICD-10-CM | POA: Diagnosis not present

## 2019-06-02 DIAGNOSIS — R600 Localized edema: Secondary | ICD-10-CM | POA: Diagnosis not present

## 2019-06-04 DIAGNOSIS — M1712 Unilateral primary osteoarthritis, left knee: Secondary | ICD-10-CM | POA: Diagnosis not present

## 2019-06-11 ENCOUNTER — Other Ambulatory Visit: Payer: Self-pay | Admitting: Orthopedic Surgery

## 2019-06-11 DIAGNOSIS — M1712 Unilateral primary osteoarthritis, left knee: Secondary | ICD-10-CM | POA: Diagnosis not present

## 2019-06-11 DIAGNOSIS — M25562 Pain in left knee: Secondary | ICD-10-CM

## 2019-06-27 ENCOUNTER — Other Ambulatory Visit: Payer: Self-pay

## 2019-06-27 ENCOUNTER — Ambulatory Visit
Admission: RE | Admit: 2019-06-27 | Discharge: 2019-06-27 | Disposition: A | Discharge status: Home / Self Care | Payer: Medicare Other | Source: Ambulatory Visit | Attending: Orthopedic Surgery | Admitting: Orthopedic Surgery

## 2019-06-27 DIAGNOSIS — M25562 Pain in left knee: Secondary | ICD-10-CM | POA: Diagnosis not present

## 2019-07-02 ENCOUNTER — Other Ambulatory Visit: Payer: Medicare Other

## 2019-07-02 DIAGNOSIS — M1712 Unilateral primary osteoarthritis, left knee: Secondary | ICD-10-CM | POA: Diagnosis not present

## 2019-07-08 DIAGNOSIS — M25562 Pain in left knee: Secondary | ICD-10-CM | POA: Diagnosis not present

## 2019-07-08 DIAGNOSIS — I10 Essential (primary) hypertension: Secondary | ICD-10-CM | POA: Diagnosis not present

## 2019-07-14 DIAGNOSIS — M25562 Pain in left knee: Secondary | ICD-10-CM | POA: Diagnosis not present

## 2019-07-14 DIAGNOSIS — R791 Abnormal coagulation profile: Secondary | ICD-10-CM | POA: Diagnosis not present

## 2019-07-14 DIAGNOSIS — M1712 Unilateral primary osteoarthritis, left knee: Secondary | ICD-10-CM | POA: Diagnosis not present

## 2019-07-21 ENCOUNTER — Ambulatory Visit (HOSPITAL_COMMUNITY): Payer: Medicare Other

## 2019-07-28 ENCOUNTER — Ambulatory Visit: Payer: Medicare Other | Admitting: Physical Therapy

## 2019-07-31 DIAGNOSIS — G8918 Other acute postprocedural pain: Secondary | ICD-10-CM | POA: Diagnosis not present

## 2019-07-31 DIAGNOSIS — M1712 Unilateral primary osteoarthritis, left knee: Secondary | ICD-10-CM | POA: Diagnosis not present

## 2019-08-01 DIAGNOSIS — M1712 Unilateral primary osteoarthritis, left knee: Secondary | ICD-10-CM | POA: Diagnosis not present

## 2019-08-01 DIAGNOSIS — Z96652 Presence of left artificial knee joint: Secondary | ICD-10-CM | POA: Diagnosis not present

## 2019-08-02 DIAGNOSIS — I10 Essential (primary) hypertension: Secondary | ICD-10-CM | POA: Diagnosis not present

## 2019-08-02 DIAGNOSIS — J45909 Unspecified asthma, uncomplicated: Secondary | ICD-10-CM | POA: Diagnosis not present

## 2019-08-02 DIAGNOSIS — G44009 Cluster headache syndrome, unspecified, not intractable: Secondary | ICD-10-CM | POA: Diagnosis not present

## 2019-08-02 DIAGNOSIS — Z96652 Presence of left artificial knee joint: Secondary | ICD-10-CM | POA: Diagnosis not present

## 2019-08-02 DIAGNOSIS — Z471 Aftercare following joint replacement surgery: Secondary | ICD-10-CM | POA: Diagnosis not present

## 2019-08-05 ENCOUNTER — Ambulatory Visit: Payer: Medicare Other | Admitting: Physical Therapy

## 2019-08-05 DIAGNOSIS — I10 Essential (primary) hypertension: Secondary | ICD-10-CM | POA: Diagnosis not present

## 2019-08-05 DIAGNOSIS — G44009 Cluster headache syndrome, unspecified, not intractable: Secondary | ICD-10-CM | POA: Diagnosis not present

## 2019-08-05 DIAGNOSIS — J45909 Unspecified asthma, uncomplicated: Secondary | ICD-10-CM | POA: Diagnosis not present

## 2019-08-05 DIAGNOSIS — Z96652 Presence of left artificial knee joint: Secondary | ICD-10-CM | POA: Diagnosis not present

## 2019-08-05 DIAGNOSIS — Z471 Aftercare following joint replacement surgery: Secondary | ICD-10-CM | POA: Diagnosis not present

## 2019-08-06 DIAGNOSIS — Z471 Aftercare following joint replacement surgery: Secondary | ICD-10-CM | POA: Diagnosis not present

## 2019-08-06 DIAGNOSIS — Z96652 Presence of left artificial knee joint: Secondary | ICD-10-CM | POA: Diagnosis not present

## 2019-08-06 DIAGNOSIS — G44009 Cluster headache syndrome, unspecified, not intractable: Secondary | ICD-10-CM | POA: Diagnosis not present

## 2019-08-06 DIAGNOSIS — J45909 Unspecified asthma, uncomplicated: Secondary | ICD-10-CM | POA: Diagnosis not present

## 2019-08-06 DIAGNOSIS — I10 Essential (primary) hypertension: Secondary | ICD-10-CM | POA: Diagnosis not present

## 2019-08-08 DIAGNOSIS — G44009 Cluster headache syndrome, unspecified, not intractable: Secondary | ICD-10-CM | POA: Diagnosis not present

## 2019-08-08 DIAGNOSIS — Z96652 Presence of left artificial knee joint: Secondary | ICD-10-CM | POA: Diagnosis not present

## 2019-08-08 DIAGNOSIS — Z471 Aftercare following joint replacement surgery: Secondary | ICD-10-CM | POA: Diagnosis not present

## 2019-08-08 DIAGNOSIS — I10 Essential (primary) hypertension: Secondary | ICD-10-CM | POA: Diagnosis not present

## 2019-08-08 DIAGNOSIS — J45909 Unspecified asthma, uncomplicated: Secondary | ICD-10-CM | POA: Diagnosis not present

## 2019-08-11 DIAGNOSIS — Z471 Aftercare following joint replacement surgery: Secondary | ICD-10-CM | POA: Diagnosis not present

## 2019-08-11 DIAGNOSIS — J45909 Unspecified asthma, uncomplicated: Secondary | ICD-10-CM | POA: Diagnosis not present

## 2019-08-11 DIAGNOSIS — Z96652 Presence of left artificial knee joint: Secondary | ICD-10-CM | POA: Diagnosis not present

## 2019-08-11 DIAGNOSIS — I10 Essential (primary) hypertension: Secondary | ICD-10-CM | POA: Diagnosis not present

## 2019-08-11 DIAGNOSIS — G44009 Cluster headache syndrome, unspecified, not intractable: Secondary | ICD-10-CM | POA: Diagnosis not present

## 2019-08-13 DIAGNOSIS — M1712 Unilateral primary osteoarthritis, left knee: Secondary | ICD-10-CM | POA: Diagnosis not present

## 2019-08-14 ENCOUNTER — Ambulatory Visit: Payer: Medicare Other | Attending: Orthopedic Surgery | Admitting: Physical Therapy

## 2019-08-14 ENCOUNTER — Other Ambulatory Visit: Payer: Self-pay

## 2019-08-14 ENCOUNTER — Ambulatory Visit (HOSPITAL_COMMUNITY)
Admission: RE | Admit: 2019-08-14 | Discharge: 2019-08-14 | Disposition: A | Discharge status: Home / Self Care | Payer: Medicare Other | Source: Ambulatory Visit | Attending: Orthopedic Surgery | Admitting: Orthopedic Surgery

## 2019-08-14 ENCOUNTER — Other Ambulatory Visit (HOSPITAL_COMMUNITY): Payer: Self-pay | Admitting: Orthopedic Surgery

## 2019-08-14 ENCOUNTER — Encounter: Payer: Self-pay | Admitting: Physical Therapy

## 2019-08-14 DIAGNOSIS — M25462 Effusion, left knee: Secondary | ICD-10-CM | POA: Insufficient documentation

## 2019-08-14 DIAGNOSIS — M25562 Pain in left knee: Secondary | ICD-10-CM

## 2019-08-14 DIAGNOSIS — M6281 Muscle weakness (generalized): Secondary | ICD-10-CM

## 2019-08-14 DIAGNOSIS — R600 Localized edema: Secondary | ICD-10-CM | POA: Diagnosis not present

## 2019-08-14 DIAGNOSIS — M25662 Stiffness of left knee, not elsewhere classified: Secondary | ICD-10-CM

## 2019-08-14 NOTE — Therapy (Signed)
[facility] [place], [place], [address] Phone: [phone]   Fax:  [phone]  Physical Therapy Evaluation  Patient Details  Name: Traci Johnson MRN: [id number] Date of Birth: 12/20/1950 Referring Provider (PT): Edmonia Lynch, MD   Encounter Date: 08/14/2019  PT End of Session - 08/14/19 2052    Visit Number  1    Number of Visits  18    Date for PT Re-Evaluation  10/02/19    Authorization Type  FOTO, Progress note every 10th visit, KX modifier at 15th visit    PT Start Time  0945    PT Stop Time  1028    PT Time Calculation (min)  43 min    Equipment Utilized During Treatment  Other (comment)   rolling walker   Activity Tolerance  Patient limited by pain    Behavior During Therapy  Anxious       Past Medical History:  Diagnosis Date  . Allergy   . Asthma   . Hypertension   . Migraines    cluster    Past Surgical History:  Procedure Laterality Date  . ABDOMINAL HYSTERECTOMY  1985  . CHOLECYSTECTOMY N/A 05/02/2017   Procedure: LAPAROSCOPIC CHOLECYSTECTOMY;  Surgeon: Kinsinger, Arta Bruce, MD;  Location: [facility];  Service: General;  Laterality: N/A;  . LEFT HEART CATH AND CORONARY ANGIOGRAPHY N/A 04/30/2017   Procedure: LEFT HEART CATH AND CORONARY ANGIOGRAPHY;  Surgeon: Lorretta Harp, MD;  Location: [facility];  Service: Cardiovascular;  Laterality: N/A;  . LIGAMENT REPAIR Right    birth defect    There were no vitals filed for this visit.   Subjective Assessment - 08/14/19 2049    Subjective  COVID-19 screening performed upon arrival.Patient arrives to physical therapy with left knee pain and reports of difficulty walking and pain with sitting secondary to a left TKA on 07/31/2019. Patient reports having home health PT but has not been compliant with HEP secondary to pain. Patient reports severe levels of pain limits her ability to perform ADLs and exercises. Patient reports pain at worst as  "30/10" and pain at best is 0/10 when resting in a certain position. Patient's goals are to decrease pain, improve movement, improve strength, and return to recreational activities and enjoying life.    Pertinent History  left knee TKA 07/31/2019, partial R TKA 05/2018, HTN, Asthma    Limitations  Sitting;Standing;Walking;House hold activities    How long can you sit comfortably?  unable without pain    How long can you stand comfortably?  short periods    How long can you walk comfortably?  household    Patient Stated Goals  decrease pain, return to recreational activities    Currently in Pain?  Yes    Pain Score  10-Worst pain ever    Pain Location  Knee    Pain Orientation  Left    Pain Descriptors / Indicators  Aching;Sharp;Throbbing    Pain Type  Surgical pain    Pain Onset  1 to 4 weeks ago    Pain Frequency  Constant    Aggravating Factors   "anything"    Pain Relieving Factors  "rest"    Effect of Pain on Daily Activities  everything has pain         [facility] PT Assessment - 08/14/19 0001      Assessment   Medical Diagnosis  S/P left total knee replacement    Referring Provider (PT)  Edmonia Lynch,  MD    Onset Date/Surgical Date  07/31/19    Next MD Visit  08/31/2019    Prior Therapy  home health PT      Precautions   Precautions  None      Restrictions   Weight Bearing Restrictions  No      Balance Screen   Has the patient fallen in the past 6 months  No    Has the patient had a decrease in activity level because of a fear of falling?   Yes    Is the patient reluctant to leave their home because of a fear of falling?   Yes      [place]  Private residence    Living Arrangements  Spouse/significant other      Prior Function   Level of Independence  Needs assistance with ADLs;Needs assistance with homemaking;Needs assistance with transfers    Comments  Assistance from husband and support from church friends      Observation/Other  Assessments   Focus on Therapeutic Outcomes (FOTO)   82% limitation      Observation/Other Assessments-Edema    Edema  Circumferential      Circumferential Edema   Circumferential - Right  42.5 cm at mid patella    Circumferential - Left   39.0 cm at mid patella      ROM / Strength   AROM / PROM / Strength  PROM;AROM      AROM   Overall AROM   Deficits;Due to pain    AROM Assessment Site  Knee    Right/Left Knee  Left    Left Knee Extension  11    Left Knee Flexion  82      PROM   Overall PROM   Deficits;Due to pain    PROM Assessment Site  Knee    Right/Left Knee  Left    Left Knee Extension  8    Left Knee Flexion  95      Palpation   Patella mobility  decreased patella mobility possibly secondary to muscular guarding    Palpation comment  very tender to palpation throughout left anterior knee      Transfers   Comments  requires A to raise LE on/off plinth      Ambulation/Gait   Gait Pattern  Step-to pattern;Decreased stance time - left;Decreased step length - right;Decreased stride length;Decreased hip/knee flexion - left;Decreased weight shift to left;Left flexed knee in stance;Left foot flat;Antalgic                Objective measurements completed on examination: See above findings.      [facility] Adult PT Treatment/Exercise - 08/14/19 0001      Modalities   Modalities  Electrical Stimulation;Vasopneumatic      Acupuncturist Location  left knee    Electrical Stimulation Action  IFC    Electrical Stimulation Parameters  80-150 hz x15 mins    Electrical Stimulation Goals  Edema;Pain      Vasopneumatic   Number Minutes Vasopneumatic   15 minutes    Vasopnuematic Location   Knee    Vasopneumatic Pressure  Low    Vasopneumatic Temperature   34             PT Education - 08/14/19 2051    Education Details  Continue HEP provided by home health    Person(s) Educated  Patient    Methods  Explanation  Comprehension  Verbalized understanding          PT Long Term Goals - 08/14/19 2058      PT LONG TERM GOAL #1   Title  Patient will be independent with HEP and its progression    Time  6    Period  Weeks    Status  New      PT LONG TERM GOAL #2   Title  Patient will demonstrate 115+ degrees of left knee flexion AROM to improve functional tasks.    Time  6    Period  Weeks    Status  New      PT LONG TERM GOAL #3   Title  Patient will demonstrate 3 degrees or less of left knee extension AROM to improve gait mechanics.    Time  6    Period  Weeks    Status  New      PT LONG TERM GOAL #4   Title  Patient will demonstrate 4+/5 or greater left knee and hip strength to improve stability during functional tasks.    Time  6    Period  Weeks    Status  New      PT LONG TERM GOAL #5   Title  Patient will report ability to ambulate a community distance without assistive device and left knee pain less than or equal to 3/10.    Time  6    Period  Weeks    Status  Achieved             Plan - 08/14/19 2053    Clinical Impression Statement  Patient is a 69 year old female who presents to physical therapy with left knee pain, decreased left knee ROM, decreased left knee MMT upon gross assessment secondary to a left TKA on 07/31/2019. Patient requires assistance to lift left LE on/off plinth. Patient noted with increased edema measured at mid patella. Patient ambulates with a rolling walker with an antalgic gait pattern. Patient and PT discussed the importance of early mobility to prevent contracture and educated the importance of HEP and walking program to attain goals. Patient reported understanding. Patient would benefit from skilled physical therapy to address deficits and address patient's goals.    Personal Factors and Comorbidities  Age;Comorbidity 2    Comorbidities  left knee TKA 07/31/2019, partial R TKA 05/2018, HTN, Asthma    Examination-Activity Limitations  Bathing;Bed  Mobility;Locomotion Level;Transfers;Stand;Stairs;Carry;Sit    Stability/Clinical Decision Making  Stable/Uncomplicated    Clinical Decision Making  Low    Rehab Potential  Good    PT Frequency  3x / week    PT Duration  6 weeks    PT Treatment/Interventions  ADLs/Self Care Home Management;Cryotherapy;Electrical Stimulation;Moist Heat;Iontophoresis 4mg /ml Dexamethasone;Gait training;Stair training;Functional mobility training;Therapeutic activities;Therapeutic exercise;Balance training;Neuromuscular re-education;Manual techniques;Passive range of motion;Patient/family education;Scar mobilization;Vasopneumatic Device;Taping    PT Next Visit Plan  nustep, AROM and PROM to left knee, modalities PRN for pain relief    PT Home Exercise Plan  continue HEP provided by HHPT, educated on importance of early mobility.    Consulted and Agree with Plan of Care  Patient       Patient will benefit from skilled therapeutic intervention in order to improve the following deficits and impairments:  Abnormal gait, Decreased activity tolerance, Decreased balance, Increased edema, Decreased strength, Decreased range of motion, Difficulty walking, Pain  Visit Diagnosis: Acute pain of left knee - Plan: PT plan of care cert/re-cert  Stiffness of left  knee, not elsewhere classified - Plan: PT plan of care cert/re-cert  Muscle weakness (generalized) - Plan: PT plan of care cert/re-cert  Localized edema - Plan: PT plan of care cert/re-cert     Problem List Patient Active Problem List   Diagnosis Date Noted  . Encounter for well woman exam with routine gynecological exam 03/25/2018  . Screening for colorectal cancer 03/25/2018  . Fecal occult blood test positive 03/25/2018  . Current use of estrogen therapy 03/25/2018  . Orthostatic hypotension 05/03/2017  . Syncope due to orthostatic hypotension 05/03/2017  . Symptomatic cholelithiasis 05/02/2017  . Acute cholecystitis 05/02/2017  . Abnormal nuclear stress  test   . Chest pain 04/27/2017  . Hyperglycemia 04/27/2017  . Anemia 04/27/2017  . Essential hypertension 07/15/2014  . Mild persistent asthma without complication [id number]  . Vitamin D deficiency 07/15/2014    Gabriela Eves, PT, DPT 08/14/2019, 9:30 PM  [facility] [place], [place], [address] Phone: [phone]   Fax:  [phone]  Name: Traci Johnson MRN: [id number] Date of Birth: [birth year]

## 2019-08-18 ENCOUNTER — Other Ambulatory Visit: Payer: Self-pay

## 2019-08-18 ENCOUNTER — Ambulatory Visit: Payer: Medicare Other | Admitting: Physical Therapy

## 2019-08-18 ENCOUNTER — Encounter: Payer: Self-pay | Admitting: Physical Therapy

## 2019-08-18 DIAGNOSIS — M6281 Muscle weakness (generalized): Secondary | ICD-10-CM | POA: Diagnosis not present

## 2019-08-18 DIAGNOSIS — R600 Localized edema: Secondary | ICD-10-CM

## 2019-08-18 DIAGNOSIS — M25562 Pain in left knee: Secondary | ICD-10-CM | POA: Diagnosis not present

## 2019-08-18 DIAGNOSIS — M25662 Stiffness of left knee, not elsewhere classified: Secondary | ICD-10-CM

## 2019-08-18 NOTE — Therapy (Signed)
[facility] [place], [place], [address] Phone: [phone]   Fax:  [phone]  Physical Therapy Treatment  Patient Details  Name: Traci Johnson MRN: [id number] Date of Birth: Oct 13, 1950 Referring Provider (PT): Edmonia Lynch, MD   Encounter Date: 08/18/2019  PT End of Session - 08/18/19 0808    Visit Number  2    Number of Visits  18    Date for PT Re-Evaluation  10/02/19    PT Start Time  0734    PT Stop Time  0821    PT Time Calculation (min)  47 min    Activity Tolerance  Patient limited by pain;Patient tolerated treatment well    Behavior During Therapy  [facility] for tasks assessed/performed       Past Medical History:  Diagnosis Date  . Allergy   . Asthma   . Hypertension   . Migraines    cluster    Past Surgical History:  Procedure Laterality Date  . ABDOMINAL HYSTERECTOMY  1985  . CHOLECYSTECTOMY N/A 05/02/2017   Procedure: LAPAROSCOPIC CHOLECYSTECTOMY;  Surgeon: Kinsinger, Arta Bruce, MD;  Location: [facility];  Service: General;  Laterality: N/A;  . LEFT HEART CATH AND CORONARY ANGIOGRAPHY N/A 04/30/2017   Procedure: LEFT HEART CATH AND CORONARY ANGIOGRAPHY;  Surgeon: Lorretta Harp, MD;  Location: [facility];  Service: Cardiovascular;  Laterality: N/A;  . LIGAMENT REPAIR Right    birth defect    There were no vitals filed for this visit.  Subjective Assessment - 08/18/19 0740    Subjective  COVID-19 screening performed upon arrival. Patient arrived in a lot of pain today    Pertinent History  left knee TKA 07/31/2019, partial R TKA 05/2018, HTN, Asthma    Limitations  Sitting;Standing;Walking;House hold activities    How long can you sit comfortably?  unable without pain    How long can you stand comfortably?  short periods    How long can you walk comfortably?  household    Patient Stated Goals  decrease pain, return to recreational activities    Currently in Pain?  Yes    Pain Score   10-Worst pain ever    Pain Location  Knee    Pain Orientation  Left    Pain Descriptors / Indicators  Aching;Sore    Pain Type  Surgical pain    Pain Onset  1 to 4 weeks ago    Pain Frequency  Constant    Aggravating Factors   all movement and pressure on LE    Pain Relieving Factors  rest         [facility] PT Assessment - 08/18/19 0001      AROM   AROM Assessment Site  Knee    Right/Left Knee  Left    Left Knee Extension  -12    Left Knee Flexion  90      PROM   PROM Assessment Site  Knee    Right/Left Knee  Left    Left Knee Extension  -9    Left Knee Flexion  99                   [facility] Adult PT Treatment/Exercise - 08/18/19 0001      Exercises   Exercises  Knee/Hip      Knee/Hip Exercises: Aerobic   Nustep  L1 x10 adjusted for ROM UE/LE      Knee/Hip Exercises: Supine   Short [employer]  Strengthening;Left;2 sets;10 reps      Acupuncturist Location  left knee    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C    Electrical Stimulation Parameters  80-150hz  x59min    Electrical Stimulation Goals  Edema;Pain      Vasopneumatic   Number Minutes Vasopneumatic   15 minutes    Vasopnuematic Location   Knee    Vasopneumatic Pressure  Low    Vasopneumatic Temperature   34 for edema      Manual Therapy   Manual Therapy  Passive ROM    Manual therapy comments  patella mobs in all directions    Passive ROM  manual PROM for left knee flexion / ext with low load holds to improve mobility                  PT Long Term Goals - 08/14/19 2058      PT LONG TERM GOAL #1   Title  Patient will be independent with HEP and its progression    Time  6    Period  Weeks    Status  New      PT LONG TERM GOAL #2   Title  Patient will demonstrate 115+ degrees of left knee flexion AROM to improve functional tasks.    Time  6    Period  Weeks    Status  New      PT LONG TERM GOAL #3   Title  Patient will demonstrate 3 degrees or  less of left knee extension AROM to improve gait mechanics.    Time  6    Period  Weeks    Status  New      PT LONG TERM GOAL #4   Title  Patient will demonstrate 4+/5 or greater left knee and hip strength to improve stability during functional tasks.    Time  6    Period  Weeks    Status  New      PT LONG TERM GOAL #5   Title  Patient will report ability to ambulate a community distance without assistive device and left knee pain less than or equal to 3/10.    Time  6    Period  Weeks    Status  Achieved            Plan - 08/18/19 0809    Clinical Impression Statement  Patient tolerated treatment fair due to pain level reported. Today focused on ROM to left knee to improve mobility. Patient improved with flexion range yet ext was limited. Patient was educated on self stretches daily to improve range and functional independence. Goals ongoing at this time.    Personal Factors and Comorbidities  Age;Comorbidity 2    Comorbidities  left knee TKA 07/31/2019, partial R TKA 05/2018, HTN, Asthma    Examination-Activity Limitations  Bathing;Bed Mobility;Locomotion Level;Transfers;Stand;Stairs;Carry;Sit    Stability/Clinical Decision Making  Stable/Uncomplicated    Rehab Potential  Good    PT Frequency  3x / week    PT Duration  6 weeks    PT Treatment/Interventions  ADLs/Self Care Home Management;Cryotherapy;Electrical Stimulation;Moist Heat;Iontophoresis 4mg /ml Dexamethasone;Gait training;Stair training;Functional mobility training;Therapeutic activities;Therapeutic exercise;Balance training;Neuromuscular re-education;Manual techniques;Passive range of motion;Patient/family education;Scar mobilization;Vasopneumatic Device;Taping    PT Next Visit Plan  cont with POC for ROM and progress to bike when able and standing activity, modalities PRN for pain relief    Consulted and Agree with Plan of Care  Patient  Patient will benefit from skilled therapeutic intervention in order to  improve the following deficits and impairments:  Abnormal gait, Decreased activity tolerance, Decreased balance, Increased edema, Decreased strength, Decreased range of motion, Difficulty walking, Pain  Visit Diagnosis: Acute pain of left knee  Stiffness of left knee, not elsewhere classified  Muscle weakness (generalized)  Localized edema     Problem List Patient Active Problem List   Diagnosis Date Noted  . Encounter for well woman exam with routine gynecological exam 03/25/2018  . Screening for colorectal cancer 03/25/2018  . Fecal occult blood test positive 03/25/2018  . Current use of estrogen therapy 03/25/2018  . Orthostatic hypotension 05/03/2017  . Syncope due to orthostatic hypotension 05/03/2017  . Symptomatic cholelithiasis 05/02/2017  . Acute cholecystitis 05/02/2017  . Abnormal nuclear stress test   . Chest pain 04/27/2017  . Hyperglycemia 04/27/2017  . Anemia 04/27/2017  . Essential hypertension 07/15/2014  . Mild persistent asthma without complication [id number]  . Vitamin D deficiency 07/15/2014    Phillips Climes, PTA 08/18/2019, 8:27 AM  [facility] [place], [place], [address] Phone: [phone]   Fax:  [phone]  Name: Traci Johnson MRN: [id number] Date of Birth: April 24, 1951

## 2019-08-20 ENCOUNTER — Encounter: Payer: Self-pay | Admitting: Physical Therapy

## 2019-08-20 ENCOUNTER — Ambulatory Visit: Payer: Medicare Other | Admitting: Physical Therapy

## 2019-08-20 ENCOUNTER — Other Ambulatory Visit: Payer: Self-pay

## 2019-08-20 DIAGNOSIS — M6281 Muscle weakness (generalized): Secondary | ICD-10-CM

## 2019-08-20 DIAGNOSIS — M25562 Pain in left knee: Secondary | ICD-10-CM

## 2019-08-20 DIAGNOSIS — R600 Localized edema: Secondary | ICD-10-CM

## 2019-08-20 DIAGNOSIS — M25662 Stiffness of left knee, not elsewhere classified: Secondary | ICD-10-CM

## 2019-08-20 NOTE — Therapy (Signed)
[facility] [place], [place], [address] Phone: [phone]   Fax:  [phone]  Physical Therapy Treatment  Patient Details  Name: Traci Johnson MRN: [id number] Date of Birth: [birth year] Referring Provider (PT): Edmonia Lynch, MD   Encounter Date: 08/20/2019  PT End of Session - 08/20/19 0857    Visit Number  3    Number of Visits  18    Date for PT Re-Evaluation  10/02/19    Authorization Type  FOTO, Progress note every 10th visit, KX modifier at 15th visit    PT Start Time  0817    PT Stop Time  0905    PT Time Calculation (min)  48 min    Activity Tolerance  Patient tolerated treatment well    Behavior During Therapy  [facility] for tasks assessed/performed       Past Medical History:  Diagnosis Date  . Allergy   . Asthma   . Hypertension   . Migraines    cluster    Past Surgical History:  Procedure Laterality Date  . ABDOMINAL HYSTERECTOMY  1985  . CHOLECYSTECTOMY N/A 05/02/2017   Procedure: LAPAROSCOPIC CHOLECYSTECTOMY;  Surgeon: Kinsinger, Arta Bruce, MD;  Location: [facility];  Service: General;  Laterality: N/A;  . LEFT HEART CATH AND CORONARY ANGIOGRAPHY N/A 04/30/2017   Procedure: LEFT HEART CATH AND CORONARY ANGIOGRAPHY;  Surgeon: Lorretta Harp, MD;  Location: [facility];  Service: Cardiovascular;  Laterality: N/A;  . LIGAMENT REPAIR Right    birth defect    There were no vitals filed for this visit.  Subjective Assessment - 08/20/19 0826    Subjective  COVID-19 screening performed upon arrival. Patient arrived in less discomfort    Pertinent History  left knee TKA 07/31/2019, partial R TKA 05/2018, HTN, Asthma    Limitations  Sitting;Standing;Walking;House hold activities    How long can you sit comfortably?  unable without pain    How long can you stand comfortably?  short periods    How long can you walk comfortably?  household    Patient Stated Goals  decrease pain, return to recreational  activities    Currently in Pain?  Yes    Pain Score  8     Pain Location  Knee    Pain Orientation  Left    Pain Descriptors / Indicators  Sore;Discomfort    Pain Type  Surgical pain    Pain Onset  1 to 4 weeks ago    Pain Frequency  Constant    Aggravating Factors   increased activity    Pain Relieving Factors  at rest                       [facility] Adult PT Treatment/Exercise - 08/20/19 0001      Knee/Hip Exercises: Stretches   Knee: Self-Stretch to increase Flexion  Left;3 reps;20 seconds      Knee/Hip Exercises: Aerobic   Nustep  L1 x15 adjusted for ROM UE/LE      Knee/Hip Exercises: Standing   Rocker Board  3 minutes      Electrical Stimulation   Electrical Stimulation Location  left knee    Electrical Stimulation Action  IFC    Electrical Stimulation Parameters  80-150hz  x20min    Electrical Stimulation Goals  Edema;Pain      Vasopneumatic   Number Minutes Vasopneumatic   15 minutes    Vasopnuematic Location   Knee    Vasopneumatic  Pressure  Low    Vasopneumatic Temperature   34 for edema      Manual Therapy   Manual Therapy  Passive ROM    Manual therapy comments  patella mobs in all directions    Passive ROM  manual PROM for left knee flexion / ext with low load holds to improve mobility                  PT Long Term Goals - 08/20/19 0911      PT LONG TERM GOAL #1   Title  Patient will be independent with HEP and its progression    Time  6    Period  Weeks    Status  On-going      PT LONG TERM GOAL #2   Title  Patient will demonstrate 115+ degrees of left knee flexion AROM to improve functional tasks.    Time  6    Period  Weeks    Status  On-going      PT LONG TERM GOAL #3   Title  Patient will demonstrate 3 degrees or less of left knee extension AROM to improve gait mechanics.    Time  6    Period  Weeks    Status  On-going      PT LONG TERM GOAL #4   Title  Patient will demonstrate 4+/5 or greater left knee and hip  strength to improve stability during functional tasks.    Time  6    Period  Weeks    Status  On-going      PT LONG TERM GOAL #5   Title  Patient will report ability to ambulate a community distance without assistive device and left knee pain less than or equal to 3/10.    Time  6    Period  Weeks    Status  On-going            Plan - 08/20/19 [id number]    Clinical Impression Statement  Patient tolerated treatment with less pain and able to progress with light standing activity. Patient able to tolerate PROM in left knee with less guarding and required less ossilations. Patient improving with ROM and less pain. Goals ongoing.    Personal Factors and Comorbidities  Age;Comorbidity 2    Comorbidities  left knee TKA 07/31/2019, partial R TKA 05/2018, HTN, Asthma    Examination-Activity Limitations  Bathing;Bed Mobility;Locomotion Level;Transfers;Stand;Stairs;Carry;Sit    Stability/Clinical Decision Making  Stable/Uncomplicated    Rehab Potential  Good    PT Frequency  3x / week    PT Duration  6 weeks    PT Treatment/Interventions  ADLs/Self Care Home Management;Cryotherapy;Electrical Stimulation;Moist Heat;Iontophoresis 4mg /ml Dexamethasone;Gait training;Stair training;Functional mobility training;Therapeutic activities;Therapeutic exercise;Balance training;Neuromuscular re-education;Manual techniques;Passive range of motion;Patient/family education;Scar mobilization;Vasopneumatic Device;Taping    PT Next Visit Plan  cont with POC for ROM and progress to bike when able and standing activity, modalities PRN for pain relief    Consulted and Agree with Plan of Care  Patient       Patient will benefit from skilled therapeutic intervention in order to improve the following deficits and impairments:  Abnormal gait, Decreased activity tolerance, Decreased balance, Increased edema, Decreased strength, Decreased range of motion, Difficulty walking, Pain  Visit Diagnosis: Acute pain of left  knee  Stiffness of left knee, not elsewhere classified  Muscle weakness (generalized)  Localized edema     Problem List Patient Active Problem List   Diagnosis Date Noted  . Encounter  for well woman exam with routine gynecological exam 03/25/2018  . Screening for colorectal cancer 03/25/2018  . Fecal occult blood test positive 03/25/2018  . Current use of estrogen therapy 03/25/2018  . Orthostatic hypotension 05/03/2017  . Syncope due to orthostatic hypotension 05/03/2017  . Symptomatic cholelithiasis 05/02/2017  . Acute cholecystitis 05/02/2017  . Abnormal nuclear stress test   . Chest pain 04/27/2017  . Hyperglycemia 04/27/2017  . Anemia 04/27/2017  . Essential hypertension 07/15/2014  . Mild persistent asthma without complication [id number]  . Vitamin D deficiency 07/15/2014    Phillips Climes, PTA 08/20/2019, 9:13 AM  [facility] [place], [place], [address] Phone: [phone]   Fax:  [phone]  Name: Traci Johnson MRN: [id number] Date of Birth: 05-17-1951

## 2019-08-22 ENCOUNTER — Other Ambulatory Visit: Payer: Self-pay

## 2019-08-22 ENCOUNTER — Ambulatory Visit: Payer: Medicare Other | Admitting: Physical Therapy

## 2019-08-22 DIAGNOSIS — M25662 Stiffness of left knee, not elsewhere classified: Secondary | ICD-10-CM

## 2019-08-22 DIAGNOSIS — R600 Localized edema: Secondary | ICD-10-CM | POA: Diagnosis not present

## 2019-08-22 DIAGNOSIS — M25562 Pain in left knee: Secondary | ICD-10-CM | POA: Diagnosis not present

## 2019-08-22 DIAGNOSIS — M6281 Muscle weakness (generalized): Secondary | ICD-10-CM | POA: Diagnosis not present

## 2019-08-22 NOTE — Therapy (Signed)
[facility] [address] [place], [place], [address] Phone: [phone]   Fax:  [phone]  Physical Therapy Treatment  Patient Details  Name: Traci Johnson MRN: [id number] Date of Birth: 1951-01-04 Referring Provider (PT): Margarita Rana, MD   Encounter Date: 08/22/2019  PT End of Session - 08/22/19 1113    Visit Number  4    Number of Visits  18    Date for PT Re-Evaluation  10/02/19    Authorization Type  FOTO, Progress note every 10th visit, KX modifier at 15th visit    PT Start Time  0815    PT Stop Time  0909    PT Time Calculation (min)  54 min    Activity Tolerance  Patient tolerated treatment well    Behavior During Therapy  [facility] for tasks assessed/performed       Past Medical History:  Diagnosis Date  . Allergy   . Asthma   . Hypertension   . Migraines    cluster    Past Surgical History:  Procedure Laterality Date  . ABDOMINAL HYSTERECTOMY  1985  . CHOLECYSTECTOMY N/A 05/02/2017   Procedure: LAPAROSCOPIC CHOLECYSTECTOMY;  Surgeon: Kinsinger, De Blanch, MD;  Location: [facility];  Service: General;  Laterality: N/A;  . LEFT HEART CATH AND CORONARY ANGIOGRAPHY N/A 04/30/2017   Procedure: LEFT HEART CATH AND CORONARY ANGIOGRAPHY;  Surgeon: Runell Gess, MD;  Location: [facility];  Service: Cardiovascular;  Laterality: N/A;  . LIGAMENT REPAIR Right    birth defect    There were no vitals filed for this visit.  Subjective Assessment - 08/22/19 0827    Subjective  COVID-19 screen performed prior to patient entering clinic.  Doing better.    Pertinent History  left knee TKA 07/31/2019, partial R TKA 05/2018, HTN, Asthma    Limitations  Sitting;Standing;Walking;House hold activities    How long can you sit comfortably?  unable without pain    How long can you stand comfortably?  short periods    How long can you walk comfortably?  household    Patient Stated Goals  decrease pain, return to recreational  activities    Currently in Pain?  Yes    Pain Score  8     Pain Location  Knee    Pain Orientation  Left    Pain Descriptors / Indicators  Sore;Discomfort    Pain Type  Surgical pain    Pain Onset  1 to 4 weeks ago                       [facility] Adult PT Treatment/Exercise - 08/22/19 0001      Exercises   Exercises  Knee/Hip      Knee/Hip Exercises: Aerobic   Nustep  Level 1 x 17 minutes moving forward x 2 to increase knee flexion.      Modalities   Modalities  Estate agent Stimulation Location  Left knee.    Electrical Stimulation Action  IFC    Electrical Stimulation Parameters  80-150 Hz x 20 minutes.    Electrical Stimulation Goals  Edema;Pain      Vasopneumatic   Number Minutes Vasopneumatic   20 minutes    Vasopnuematic Location   --   Left knee.   Vasopneumatic Pressure  Low      Manual Therapy   Manual Therapy  Passive ROM    Passive ROM  Left patellar mobs and PROM to patient's left knee x 8 minutes.                  PT Long Term Goals - 08/20/19 0911      PT LONG TERM GOAL #1   Title  Patient will be independent with HEP and its progression    Time  6    Period  Weeks    Status  On-going      PT LONG TERM GOAL #2   Title  Patient will demonstrate 115+ degrees of left knee flexion AROM to improve functional tasks.    Time  6    Period  Weeks    Status  On-going      PT LONG TERM GOAL #3   Title  Patient will demonstrate 3 degrees or less of left knee extension AROM to improve gait mechanics.    Time  6    Period  Weeks    Status  On-going      PT LONG TERM GOAL #4   Title  Patient will demonstrate 4+/5 or greater left knee and hip strength to improve stability during functional tasks.    Time  6    Period  Weeks    Status  On-going      PT LONG TERM GOAL #5   Title  Patient will report ability to ambulate a community distance without assistive device and  left knee pain less than or equal to 3/10.    Time  6    Period  Weeks    Status  On-going            Plan - 08/22/19 1027    Clinical Impression Statement  The patient did well today.  She needs continued PROM into both left knee extension and flexion.    Personal Factors and Comorbidities  Age;Comorbidity 2    Comorbidities  left knee TKA 07/31/2019, partial R TKA 05/2018, HTN, Asthma    Examination-Activity Limitations  Bathing;Bed Mobility;Locomotion Level;Transfers;Stand;Stairs;Carry;Sit    Stability/Clinical Decision Making  Stable/Uncomplicated    Rehab Potential  Good    PT Frequency  3x / week    PT Duration  6 weeks    PT Treatment/Interventions  ADLs/Self Care Home Management;Cryotherapy;Electrical Stimulation;Moist Heat;Iontophoresis 4mg /ml Dexamethasone;Gait training;Stair training;Functional mobility training;Therapeutic activities;Therapeutic exercise;Balance training;Neuromuscular re-education;Manual techniques;Passive range of motion;Patient/family education;Scar mobilization;Vasopneumatic Device;Taping    PT Next Visit Plan  cont with POC for ROM and progress to bike when able and standing activity, modalities PRN for pain relief    PT Home Exercise Plan  continue HEP provided by HHPT, educated on importance of early mobility.    Consulted and Agree with Plan of Care  Patient       Patient will benefit from skilled therapeutic intervention in order to improve the following deficits and impairments:  Abnormal gait, Decreased activity tolerance, Decreased balance, Increased edema, Decreased strength, Decreased range of motion, Difficulty walking, Pain  Visit Diagnosis: Acute pain of left knee  Stiffness of left knee, not elsewhere classified     Problem List Patient Active Problem List   Diagnosis Date Noted  . Encounter for well woman exam with routine gynecological exam 03/25/2018  . Screening for colorectal cancer 03/25/2018  . Fecal occult blood test  positive 03/25/2018  . Current use of estrogen therapy 03/25/2018  . Orthostatic hypotension 05/03/2017  . Syncope due to orthostatic hypotension 05/03/2017  . Symptomatic cholelithiasis 05/02/2017  . Acute cholecystitis 05/02/2017  .  Abnormal nuclear stress test   . Chest pain 04/27/2017  . Hyperglycemia 04/27/2017  . Anemia 04/27/2017  . Essential hypertension 07/15/2014  . Mild persistent asthma without complication 07/15/2014  . Vitamin D deficiency 07/15/2014    Traci Johnson, [place] MPT 08/22/2019, 11:15 AM  [facility] [address] [place], [place], [address] Phone: [phone]   Fax:  [phone]  Name: Traci Johnson MRN: [id number] Date of Birth: Mar 27, 1951

## 2019-08-25 ENCOUNTER — Encounter: Payer: Self-pay | Admitting: Physical Therapy

## 2019-08-25 ENCOUNTER — Other Ambulatory Visit: Payer: Self-pay

## 2019-08-25 ENCOUNTER — Ambulatory Visit: Payer: Medicare Other | Admitting: Physical Therapy

## 2019-08-25 DIAGNOSIS — M6281 Muscle weakness (generalized): Secondary | ICD-10-CM | POA: Diagnosis not present

## 2019-08-25 DIAGNOSIS — R600 Localized edema: Secondary | ICD-10-CM | POA: Diagnosis not present

## 2019-08-25 DIAGNOSIS — M25562 Pain in left knee: Secondary | ICD-10-CM

## 2019-08-25 DIAGNOSIS — M25662 Stiffness of left knee, not elsewhere classified: Secondary | ICD-10-CM

## 2019-08-25 NOTE — Therapy (Signed)
[facility] [place], [place], [address] Phone: [phone]   Fax:  [phone]  Physical Therapy Treatment  Patient Details  Name: Traci Johnson MRN: [id number] Date of Birth: 11-06-1950 Referring Provider (PT): Edmonia Lynch, MD   Encounter Date: 08/25/2019  PT End of Session - 08/25/19 0854    Visit Number  5    Number of Visits  18    Date for PT Re-Evaluation  10/02/19    Authorization Type  FOTO, Progress note every 10th visit, KX modifier at 15th visit    PT Start Time  0814    PT Stop Time  0909    PT Time Calculation (min)  55 min    Activity Tolerance  Patient tolerated treatment well    Behavior During Therapy  [facility] for tasks assessed/performed       Past Medical History:  Diagnosis Date  . Allergy   . Asthma   . Hypertension   . Migraines    cluster    Past Surgical History:  Procedure Laterality Date  . ABDOMINAL HYSTERECTOMY  1985  . CHOLECYSTECTOMY N/A 05/02/2017   Procedure: LAPAROSCOPIC CHOLECYSTECTOMY;  Surgeon: Kinsinger, Arta Bruce, MD;  Location: [facility];  Service: General;  Laterality: N/A;  . LEFT HEART CATH AND CORONARY ANGIOGRAPHY N/A 04/30/2017   Procedure: LEFT HEART CATH AND CORONARY ANGIOGRAPHY;  Surgeon: Lorretta Harp, MD;  Location: [facility];  Service: Cardiovascular;  Laterality: N/A;  . LIGAMENT REPAIR Right    birth defect    There were no vitals filed for this visit.  Subjective Assessment - 08/25/19 0818    Subjective  COVID-19 screen performed prior to patient entering clinic.  Patient arrived feeling better than last week and was able to perform light ADL's    Pertinent History  left knee TKA 07/31/2019, partial R TKA 05/2018, HTN, Asthma    Limitations  Sitting;Standing;Walking;House hold activities    How long can you sit comfortably?  unable without pain    How long can you stand comfortably?  short periods    How long can you walk comfortably?  household     Patient Stated Goals  decrease pain, return to recreational activities    Currently in Pain?  Yes    Pain Score  6     Pain Location  Knee    Pain Orientation  Left    Pain Descriptors / Indicators  Discomfort    Pain Type  Surgical pain    Pain Onset  1 to 4 weeks ago    Pain Frequency  Constant    Aggravating Factors   increased activity, bending knee    Pain Relieving Factors  at rest, meds         [facility] PT Assessment - 08/25/19 0001      AROM   AROM Assessment Site  Knee    Right/Left Knee  Left    Left Knee Extension  -13    Left Knee Flexion  96      PROM   PROM Assessment Site  Knee    Right/Left Knee  Left    Left Knee Extension  -9    Left Knee Flexion  105                   [facility] Adult PT Treatment/Exercise - 08/25/19 0001      Knee/Hip Exercises: Stretches   Knee: Self-Stretch to increase Flexion  Left;3 reps;20 seconds  Knee/Hip Exercises: Aerobic   Recumbent Bike  attempted bike and was able to make 2 revolotions yet reported too painful    Nustep  L3 x11min adjusted for ROM      Knee/Hip Exercises: Standing   Forward Step Up  Left;2 sets;10 reps;Step Height: 4"    Rocker Board  3 minutes      Electrical Stimulation   Electrical Stimulation Location  Left knee.    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C    Electrical Stimulation Parameters  80-150hz  x58min    Electrical Stimulation Goals  Edema;Pain      Vasopneumatic   Number Minutes Vasopneumatic   15 minutes    Vasopnuematic Location   Knee    Vasopneumatic Pressure  Low    Vasopneumatic Temperature   34 for edema      Manual Therapy   Manual Therapy  Passive ROM    Manual therapy comments  patella mobs in all directions    Passive ROM  manual PROM for left knee flexion and ext with low load holds to improve mobility                  PT Long Term Goals - 08/25/19 0834      PT LONG TERM GOAL #1   Title  Patient will be independent with HEP and its progression     Time  6    Period  Weeks    Status  On-going      PT LONG TERM GOAL #2   Title  Patient will demonstrate 115+ degrees of left knee flexion AROM to improve functional tasks.    Time  6    Period  Weeks    Status  On-going      PT LONG TERM GOAL #3   Title  Patient will demonstrate 3 degrees or less of left knee extension AROM to improve gait mechanics.    Time  6    Period  Weeks    Status  On-going      PT LONG TERM GOAL #4   Title  Patient will demonstrate 4+/5 or greater left knee and hip strength to improve stability during functional tasks.    Time  6    Period  Weeks    Status  On-going      PT LONG TERM GOAL #5   Title  Patient will report ability to ambulate a community distance without assistive device and left knee pain less than or equal to 3/10.    Time  6    Period  Weeks    Status  On-going            Plan - 08/25/19 [id number]    Clinical Impression Statement  Patient tolerated treatment well today. Patient attemted bike yet did not want to stay on it after 2 revolutions due to discomfort. Patient able to progress with standing activities and tolerate with no increased difficulty. Today patient improved with flexion ROM yet no improvement with ext. Today focused more on ext stretching to improve mobility. Current goals ongoing.    Personal Factors and Comorbidities  Age;Comorbidity 2    Comorbidities  left knee TKA 07/31/2019, partial R TKA 05/2018, HTN, Asthma    Examination-Activity Limitations  Bathing;Bed Mobility;Locomotion Level;Transfers;Stand;Stairs;Carry;Sit    Stability/Clinical Decision Making  Stable/Uncomplicated    Rehab Potential  Good    PT Frequency  3x / week    PT Duration  6 weeks    PT Treatment/Interventions  ADLs/Self Care Home Management;Cryotherapy;Electrical Stimulation;Moist Heat;Iontophoresis 4mg /ml Dexamethasone;Gait training;Stair training;Functional mobility training;Therapeutic activities;Therapeutic exercise;Balance  training;Neuromuscular re-education;Manual techniques;Passive range of motion;Patient/family education;Scar mobilization;Vasopneumatic Device;Taping    PT Next Visit Plan  cont with POC for ROM and progress to bike when able and standing activity, modalities PRN for pain relief    Consulted and Agree with Plan of Care  Patient       Patient will benefit from skilled therapeutic intervention in order to improve the following deficits and impairments:  Abnormal gait, Decreased activity tolerance, Decreased balance, Increased edema, Decreased strength, Decreased range of motion, Difficulty walking, Pain  Visit Diagnosis: Acute pain of left knee  Stiffness of left knee, not elsewhere classified  Muscle weakness (generalized)  Localized edema     Problem List Patient Active Problem List   Diagnosis Date Noted  . Encounter for well woman exam with routine gynecological exam 03/25/2018  . Screening for colorectal cancer 03/25/2018  . Fecal occult blood test positive 03/25/2018  . Current use of estrogen therapy 03/25/2018  . Orthostatic hypotension 05/03/2017  . Syncope due to orthostatic hypotension 05/03/2017  . Symptomatic cholelithiasis 05/02/2017  . Acute cholecystitis 05/02/2017  . Abnormal nuclear stress test   . Chest pain 04/27/2017  . Hyperglycemia 04/27/2017  . Anemia 04/27/2017  . Essential hypertension 07/15/2014  . Mild persistent asthma without complication [id number]  . Vitamin D deficiency 07/15/2014    Phillips Climes, PTA 08/25/2019, 9:35 AM  [facility] [place], [place], [address] Phone: [phone]   Fax:  [phone]  Name: Mazell Lujan MRN: [id number] Date of Birth: Sep 29, 1950

## 2019-08-27 ENCOUNTER — Other Ambulatory Visit: Payer: Self-pay

## 2019-08-27 ENCOUNTER — Ambulatory Visit: Payer: Medicare Other | Admitting: Physical Therapy

## 2019-08-27 ENCOUNTER — Encounter: Payer: Self-pay | Admitting: Physical Therapy

## 2019-08-27 DIAGNOSIS — R600 Localized edema: Secondary | ICD-10-CM

## 2019-08-27 DIAGNOSIS — M25562 Pain in left knee: Secondary | ICD-10-CM | POA: Diagnosis not present

## 2019-08-27 DIAGNOSIS — M6281 Muscle weakness (generalized): Secondary | ICD-10-CM

## 2019-08-27 DIAGNOSIS — M25662 Stiffness of left knee, not elsewhere classified: Secondary | ICD-10-CM | POA: Diagnosis not present

## 2019-08-27 NOTE — Therapy (Signed)
[facility] [place], [place], [address] Phone: [phone]   Fax:  [phone]  Physical Therapy Treatment  Patient Details  Name: Traci Johnson MRN: [id number] Date of Birth: 1950-08-12 Referring Provider (PT): Edmonia Lynch, MD   Encounter Date: 08/27/2019  PT End of Session - 08/27/19 0854    Visit Number  6    Number of Visits  18    Date for PT Re-Evaluation  10/02/19    Authorization Type  FOTO, Progress note every 10th visit, KX modifier at 15th visit    PT Start Time  0819    PT Stop Time  0911    PT Time Calculation (min)  52 min    Equipment Utilized During Treatment  Other (comment)   FWW   Activity Tolerance  Patient tolerated treatment well    Behavior During Therapy  [facility] for tasks assessed/performed       Past Medical History:  Diagnosis Date  . Allergy   . Asthma   . Hypertension   . Migraines    cluster    Past Surgical History:  Procedure Laterality Date  . ABDOMINAL HYSTERECTOMY  1985  . CHOLECYSTECTOMY N/A 05/02/2017   Procedure: LAPAROSCOPIC CHOLECYSTECTOMY;  Surgeon: Kinsinger, Arta Bruce, MD;  Location: [facility];  Service: General;  Laterality: N/A;  . LEFT HEART CATH AND CORONARY ANGIOGRAPHY N/A 04/30/2017   Procedure: LEFT HEART CATH AND CORONARY ANGIOGRAPHY;  Surgeon: Lorretta Harp, MD;  Location: [place] [facility];  Service: Cardiovascular;  Laterality: N/A;  . LIGAMENT REPAIR Right    birth defect    There were no vitals filed for this visit.  Subjective Assessment - 08/27/19 0838    Subjective  COVID-19 screen performed prior to patient entering clinic.  Patient reports she is very stiff in the mornings and changed her appointments for next week to later in the mornings.    Pertinent History  left knee TKA 07/31/2019, partial R TKA 05/2018, HTN, Asthma    Limitations  Sitting;Standing;Walking;House hold activities    How long can you sit comfortably?  unable without pain     How long can you stand comfortably?  short periods    How long can you walk comfortably?  household    Patient Stated Goals  decrease pain, return to recreational activities    Currently in Pain?  Yes    Pain Score  6     Pain Location  Knee    Pain Orientation  Left    Pain Descriptors / Indicators  Burning    Pain Type  Surgical pain    Pain Onset  1 to 4 weeks ago    Pain Frequency  Constant         [facility] PT Assessment - 08/27/19 0001      Assessment   Medical Diagnosis  S/P left total knee replacement    Referring Provider (PT)  Edmonia Lynch, MD    Onset Date/Surgical Date  07/31/19    Next MD Visit  08/31/2019    Prior Therapy  home health PT      Precautions   Precautions  None      Restrictions   Weight Bearing Restrictions  No                   [facility] Adult PT Treatment/Exercise - 08/27/19 0001      Knee/Hip Exercises: Aerobic   Nustep  L3, seat 6 x18 min  Knee/Hip Exercises: Standing   Forward Lunges  Left;20 reps    Forward Lunges Limitations  off 6 "step     Forward Step Up  Left;2 sets;10 reps;Step Height: 4"    Rocker Board  3 minutes      Knee/Hip Exercises: Supine   Short Arc Quad Sets  AROM;Left;20 reps    Heel Slides  AAROM;Left;15 reps      Modalities   Modalities  Psychologist, educational Location  L knee    Electrical Stimulation Action  IFC    Electrical Stimulation Parameters  80-150 hz x10 min    Electrical Stimulation Goals  Edema;Pain      Vasopneumatic   Number Minutes Vasopneumatic   10 minutes    Vasopnuematic Location   Knee    Vasopneumatic Pressure  Low    Vasopneumatic Temperature   71 for edema                  PT Long Term Goals - 08/25/19 0834      PT LONG TERM GOAL #1   Title  Patient will be independent with HEP and its progression    Time  6    Period  Weeks    Status  On-going      PT LONG TERM GOAL #2   Title   Patient will demonstrate 115+ degrees of left knee flexion AROM to improve functional tasks.    Time  6    Period  Weeks    Status  On-going      PT LONG TERM GOAL #3   Title  Patient will demonstrate 3 degrees or less of left knee extension AROM to improve gait mechanics.    Time  6    Period  Weeks    Status  On-going      PT LONG TERM GOAL #4   Title  Patient will demonstrate 4+/5 or greater left knee and hip strength to improve stability during functional tasks.    Time  6    Period  Weeks    Status  On-going      PT LONG TERM GOAL #5   Title  Patient will report ability to ambulate a community distance without assistive device and left knee pain less than or equal to 3/10.    Time  6    Period  Weeks    Status  On-going            Plan - 08/27/19 0919    Clinical Impression Statement  Patient presented in clinic with reports of more stiffness initially in the morning time. Patient able to complete therex well with no complaints of any increased pain. Weakness noted of L quad during AROM SAQ. Small areas of scabbing noted along superior L knee incision. Normal modalities response noted following removal of the modalities.    Personal Factors and Comorbidities  Age;Comorbidity 2    Comorbidities  left knee TKA 07/31/2019, partial R TKA 05/2018, HTN, Asthma    Examination-Activity Limitations  Bathing;Bed Mobility;Locomotion Level;Transfers;Stand;Stairs;Carry;Sit    Stability/Clinical Decision Making  Stable/Uncomplicated    Rehab Potential  Good    PT Frequency  3x / week    PT Duration  6 weeks    PT Treatment/Interventions  ADLs/Self Care Home Management;Cryotherapy;Electrical Stimulation;Moist Heat;Iontophoresis 4mg /ml Dexamethasone;Gait training;Stair training;Functional mobility training;Therapeutic activities;Therapeutic exercise;Balance training;Neuromuscular re-education;Manual techniques;Passive range of motion;Patient/family education;Scar mobilization;Vasopneumatic  Device;Taping    PT Next  Visit Plan  cont with POC for ROM and progress to bike when able and standing activity, modalities PRN for pain relief    PT Home Exercise Plan  continue HEP provided by HHPT, educated on importance of early mobility.    Consulted and Agree with Plan of Care  Patient       Patient will benefit from skilled therapeutic intervention in order to improve the following deficits and impairments:  Abnormal gait, Decreased activity tolerance, Decreased balance, Increased edema, Decreased strength, Decreased range of motion, Difficulty walking, Pain  Visit Diagnosis: Acute pain of left knee  Stiffness of left knee, not elsewhere classified  Muscle weakness (generalized)  Localized edema     Problem List Patient Active Problem List   Diagnosis Date Noted  . Encounter for well woman exam with routine gynecological exam 03/25/2018  . Screening for colorectal cancer 03/25/2018  . Fecal occult blood test positive 03/25/2018  . Current use of estrogen therapy 03/25/2018  . Orthostatic hypotension 05/03/2017  . Syncope due to orthostatic hypotension 05/03/2017  . Symptomatic cholelithiasis 05/02/2017  . Acute cholecystitis 05/02/2017  . Abnormal nuclear stress test   . Chest pain 04/27/2017  . Hyperglycemia 04/27/2017  . Anemia 04/27/2017  . Essential hypertension 07/15/2014  . Mild persistent asthma without complication [id number]  . Vitamin D deficiency 07/15/2014    Standley Brooking, PTA 08/27/2019, 9:26 AM  [facility] [address] [place], [place], [address] Phone: [phone]   Fax:  [phone]  Name: Traci Johnson MRN: [id number] Date of Birth: Oct 08, 1950

## 2019-08-29 ENCOUNTER — Ambulatory Visit: Payer: Medicare Other | Admitting: Physical Therapy

## 2019-08-29 ENCOUNTER — Other Ambulatory Visit: Payer: Self-pay

## 2019-08-29 ENCOUNTER — Encounter: Payer: Self-pay | Admitting: Physical Therapy

## 2019-08-29 DIAGNOSIS — M25662 Stiffness of left knee, not elsewhere classified: Secondary | ICD-10-CM | POA: Diagnosis not present

## 2019-08-29 DIAGNOSIS — M25562 Pain in left knee: Secondary | ICD-10-CM

## 2019-08-29 DIAGNOSIS — M6281 Muscle weakness (generalized): Secondary | ICD-10-CM | POA: Diagnosis not present

## 2019-08-29 DIAGNOSIS — R600 Localized edema: Secondary | ICD-10-CM | POA: Diagnosis not present

## 2019-08-29 NOTE — Therapy (Signed)
[facility] [place], [place], [address] Phone: [phone]   Fax:  [phone]  Physical Therapy Treatment  Patient Details  Name: Traci Johnson MRN: [id number] Date of Birth: 05-27-1951 Referring Provider (PT): Edmonia Lynch, MD   Encounter Date: 08/29/2019  PT End of Session - 08/29/19 0915    Visit Number  7    Number of Visits  18    Date for PT Re-Evaluation  10/02/19    Authorization Type  FOTO, Progress note every 10th visit, KX modifier at 15th visit    PT Start Time  0816    PT Stop Time  0910    PT Time Calculation (min)  54 min    Equipment Utilized During Treatment  Other (comment)   rolling walker   Activity Tolerance  Patient tolerated treatment well    Behavior During Therapy  [facility] for tasks assessed/performed       Past Medical History:  Diagnosis Date  . Allergy   . Asthma   . Hypertension   . Migraines    cluster    Past Surgical History:  Procedure Laterality Date  . ABDOMINAL HYSTERECTOMY  1985  . CHOLECYSTECTOMY N/A 05/02/2017   Procedure: LAPAROSCOPIC CHOLECYSTECTOMY;  Surgeon: Kinsinger, Arta Bruce, MD;  Location: [facility];  Service: General;  Laterality: N/A;  . LEFT HEART CATH AND CORONARY ANGIOGRAPHY N/A 04/30/2017   Procedure: LEFT HEART CATH AND CORONARY ANGIOGRAPHY;  Surgeon: Lorretta Harp, MD;  Location: [place] [facility];  Service: Cardiovascular;  Laterality: N/A;  . LIGAMENT REPAIR Right    birth defect    There were no vitals filed for this visit.  Subjective Assessment - 08/29/19 0826    Subjective  COVID-19 screen performed prior to patient entering clinic.  Patient reports 2/10 pain today.    Pertinent History  left knee TKA 07/31/2019, partial R TKA 05/2018, HTN, Asthma    Limitations  Sitting;Standing;Walking;House hold activities    How long can you sit comfortably?  unable without pain    How long can you stand comfortably?  short periods    How long can you  walk comfortably?  household    Patient Stated Goals  decrease pain, return to recreational activities    Currently in Pain?  Yes    Pain Score  2     Pain Location  Knee    Pain Orientation  Left    Pain Descriptors / Indicators  Tightness;Aching;Sharp    Pain Type  Surgical pain    Pain Onset  More than a month ago    Pain Frequency  Constant         [facility] PT Assessment - 08/29/19 0001      Assessment   Medical Diagnosis  S/P left total knee replacement    Referring Provider (PT)  Edmonia Lynch, MD    Onset Date/Surgical Date  07/31/19    Next MD Visit  08/31/2019    Prior Therapy  home health PT      Precautions   Precautions  None                   [facility] Adult PT Treatment/Exercise - 08/29/19 0001      Knee/Hip Exercises: Stretches   Passive Hamstring Stretch  Left;3 reps;30 seconds      Knee/Hip Exercises: Aerobic   Recumbent Bike  Seat 4 Partial revolutions x10 mins    Nustep  L3, seat 6 x5 min  Knee/Hip Exercises: Standing   Forward Lunges  Left;20 reps    Forward Lunges Limitations  5" hold on 10" step     Rocker Board  3 minutes      Knee/Hip Exercises: Supine   Heel Slides  AAROM;Left;20 reps      Modalities   Modalities  Psychologist, educational Location  L knee    Electrical Stimulation Action  IFC    Electrical Stimulation Parameters  80-150 hz x10 mins    Electrical Stimulation Goals  Edema;Pain      Vasopneumatic   Number Minutes Vasopneumatic   10 minutes    Vasopnuematic Location   Knee    Vasopneumatic Pressure  Low      Manual Therapy   Manual Therapy  Passive ROM    Passive ROM  manual PROM for left knee flexion and ext with low load holds to improve mobility                  PT Long Term Goals - 08/25/19 0834      PT LONG TERM GOAL #1   Title  Patient will be independent with HEP and its progression    Time  6    Period  Weeks    Status   On-going      PT LONG TERM GOAL #2   Title  Patient will demonstrate 115+ degrees of left knee flexion AROM to improve functional tasks.    Time  6    Period  Weeks    Status  On-going      PT LONG TERM GOAL #3   Title  Patient will demonstrate 3 degrees or less of left knee extension AROM to improve gait mechanics.    Time  6    Period  Weeks    Status  On-going      PT LONG TERM GOAL #4   Title  Patient will demonstrate 4+/5 or greater left knee and hip strength to improve stability during functional tasks.    Time  6    Period  Weeks    Status  On-going      PT LONG TERM GOAL #5   Title  Patient will report ability to ambulate a community distance without assistive device and left knee pain less than or equal to 3/10.    Time  6    Period  Weeks    Status  On-going            Plan - 08/29/19 0916    Clinical Impression Statement  Patient arrives doing alright. Patient was able to perform bike at seat 4 but unable to perform full revolutions. Patient demonstrated good form after explanation. Smooth ROM into flexion with minimal guarding. No adverse affects upon removal of modalities.    Personal Factors and Comorbidities  Age;Comorbidity 2    Comorbidities  left knee TKA 07/31/2019, partial R TKA 05/2018, HTN, Asthma    Examination-Activity Limitations  Bathing;Bed Mobility;Locomotion Level;Transfers;Stand;Stairs;Carry;Sit    Stability/Clinical Decision Making  Stable/Uncomplicated    Clinical Decision Making  Low    Rehab Potential  Good    PT Frequency  3x / week    PT Duration  6 weeks    PT Treatment/Interventions  ADLs/Self Care Home Management;Cryotherapy;Electrical Stimulation;Moist Heat;Iontophoresis 4mg /ml Dexamethasone;Gait training;Stair training;Functional mobility training;Therapeutic activities;Therapeutic exercise;Balance training;Neuromuscular re-education;Manual techniques;Passive range of motion;Patient/family education;Scar mobilization;Vasopneumatic  Device;Taping    PT Next Visit Plan  cont with POC for ROM and progress to bike when able and standing activity, modalities PRN for pain relief    PT Home Exercise Plan  continue HEP provided by HHPT, educated on importance of early mobility.       Patient will benefit from skilled therapeutic intervention in order to improve the following deficits and impairments:  Abnormal gait, Decreased activity tolerance, Decreased balance, Increased edema, Decreased strength, Decreased range of motion, Difficulty walking, Pain  Visit Diagnosis: Acute pain of left knee  Stiffness of left knee, not elsewhere classified  Muscle weakness (generalized)  Localized edema     Problem List Patient Active Problem List   Diagnosis Date Noted  . Encounter for well woman exam with routine gynecological exam 03/25/2018  . Screening for colorectal cancer 03/25/2018  . Fecal occult blood test positive 03/25/2018  . Current use of estrogen therapy 03/25/2018  . Orthostatic hypotension 05/03/2017  . Syncope due to orthostatic hypotension 05/03/2017  . Symptomatic cholelithiasis 05/02/2017  . Acute cholecystitis 05/02/2017  . Abnormal nuclear stress test   . Chest pain 04/27/2017  . Hyperglycemia 04/27/2017  . Anemia 04/27/2017  . Essential hypertension 07/15/2014  . Mild persistent asthma without complication [id number]  . Vitamin D deficiency 07/15/2014    Gabriela Eves, PT, DPT 08/29/2019, 12:13 PM  [facility] [address] [place], [place], [address] Phone: [phone]   Fax:  [phone]  Name: Traci Johnson MRN: [id number] Date of Birth: 16-Mar-1951

## 2019-09-01 ENCOUNTER — Other Ambulatory Visit: Payer: Self-pay

## 2019-09-01 ENCOUNTER — Ambulatory Visit: Payer: Medicare Other | Admitting: Physical Therapy

## 2019-09-01 ENCOUNTER — Encounter: Payer: Self-pay | Admitting: Physical Therapy

## 2019-09-01 DIAGNOSIS — M25562 Pain in left knee: Secondary | ICD-10-CM

## 2019-09-01 DIAGNOSIS — R600 Localized edema: Secondary | ICD-10-CM

## 2019-09-01 DIAGNOSIS — M6281 Muscle weakness (generalized): Secondary | ICD-10-CM

## 2019-09-01 DIAGNOSIS — M25662 Stiffness of left knee, not elsewhere classified: Secondary | ICD-10-CM

## 2019-09-01 NOTE — Therapy (Signed)
[facility] [place], [place], [address] Phone: [phone]   Fax:  [phone]  Physical Therapy Treatment  Patient Details  Name: Traci Johnson MRN: [id number] Date of Birth: 10/19/1950 Referring Provider (PT): Edmonia Lynch, MD   Encounter Date: 09/01/2019  PT End of Session - 09/01/19 1155    Visit Number  8    Number of Visits  18    Date for PT Re-Evaluation  10/02/19    Authorization Type  FOTO, Progress note every 10th visit, KX modifier at 15th visit    PT Start Time  1115    PT Stop Time  1201    PT Time Calculation (min)  46 min    Activity Tolerance  Patient tolerated treatment well    Behavior During Therapy  [facility] for tasks assessed/performed       Past Medical History:  Diagnosis Date  . Allergy   . Asthma   . Hypertension   . Migraines    cluster    Past Surgical History:  Procedure Laterality Date  . ABDOMINAL HYSTERECTOMY  1985  . CHOLECYSTECTOMY N/A 05/02/2017   Procedure: LAPAROSCOPIC CHOLECYSTECTOMY;  Surgeon: Kinsinger, Arta Bruce, MD;  Location: [facility];  Service: General;  Laterality: N/A;  . LEFT HEART CATH AND CORONARY ANGIOGRAPHY N/A 04/30/2017   Procedure: LEFT HEART CATH AND CORONARY ANGIOGRAPHY;  Surgeon: Lorretta Harp, MD;  Location: [place] [facility];  Service: Cardiovascular;  Laterality: N/A;  . LIGAMENT REPAIR Right    birth defect    There were no vitals filed for this visit.  Subjective Assessment - 09/01/19 1119    Subjective  COVID-19 screen performed prior to patient entering clinic.  Patient reported doing good after last treatment. Patient had a good weekend and able to perform some ADL's and walk without walker a bit, then some swelling sunday evening.    Pertinent History  left knee TKA 07/31/2019, partial R TKA 05/2018, HTN, Asthma    Limitations  Sitting;Standing;Walking;House hold activities    How long can you sit comfortably?  unable without pain    How long  can you stand comfortably?  short periods    How long can you walk comfortably?  household    Patient Stated Goals  decrease pain, return to recreational activities    Currently in Pain?  Yes    Pain Score  2     Pain Location  Knee    Pain Orientation  Left    Pain Descriptors / Indicators  Tightness    Pain Type  Surgical pain    Pain Onset  More than a month ago    Pain Frequency  Constant    Aggravating Factors   bening knee, increased activity and prolong standing    Pain Relieving Factors  ice, medication and rest         [facility] PT Assessment - 09/01/19 0001      AROM   AROM Assessment Site  Knee    Right/Left Knee  Left    Left Knee Extension  -11    Left Knee Flexion  104      PROM   PROM Assessment Site  Knee    Right/Left Knee  Left    Left Knee Extension  -7    Left Knee Flexion  112                   [facility] Adult PT Treatment/Exercise - 09/01/19 0001  Knee/Hip Exercises: Aerobic   Recumbent Bike  75min able to perform forward and backward revolutions    Nustep  17min L3, adjusted for ROM      Knee/Hip Exercises: Standing   Rocker Board  3 minutes      Electrical Stimulation   Electrical Stimulation Location  L knee    Electrical Stimulation Action  IFC    Electrical Stimulation Parameters  80-150hz  x91min    Electrical Stimulation Goals  Edema;Pain      Vasopneumatic   Number Minutes Vasopneumatic   10 minutes    Vasopnuematic Location   Knee    Vasopneumatic Pressure  Low    Vasopneumatic Temperature   34 for edema      Manual Therapy   Manual Therapy  Passive ROM    Passive ROM  manual PROM for left knee flexion and ext with low load holds to improve mobility                  PT Long Term Goals - 09/01/19 1156      PT LONG TERM GOAL #1   Title  Patient will be independent with HEP and its progression    Time  6    Period  Weeks    Status  On-going      PT LONG TERM GOAL #2   Title  Patient will demonstrate 115+  degrees of left knee flexion AROM to improve functional tasks.    Time  6    Period  Weeks    Status  On-going   AROM 104 degrees 09/01/19     PT LONG TERM GOAL #3   Title  Patient will demonstrate 3 degrees or less of left knee extension AROM to improve gait mechanics.    Time  6    Period  Weeks    Status  On-going   AROM -11 degrees 09/01/19     PT LONG TERM GOAL #4   Title  Patient will demonstrate 4+/5 or greater left knee and hip strength to improve stability during functional tasks.    Time  6    Period  Weeks    Status  On-going   NT 09/01/19     PT LONG TERM GOAL #5   Title  Patient will report ability to ambulate a community distance without assistive device and left knee pain less than or equal to 3/10.    Time  6    Period  Weeks    Status  On-going            Plan - 09/01/19 1157    Clinical Impression Statement  Patient tolerated treatment well today. Patient has improved with active and passive ROM in left knee this week. Patient has reported less pain and is able to perform light ADL's and walk with greater ease. Patient has ongoing edema, pain and ROM deficts yet goals are progressing..    Personal Factors and Comorbidities  Age;Comorbidity 2    Comorbidities  left knee TKA 07/31/2019, partial R TKA 05/2018, HTN, Asthma    Examination-Activity Limitations  Bathing;Bed Mobility;Locomotion Level;Transfers;Stand;Stairs;Carry;Sit    Stability/Clinical Decision Making  Stable/Uncomplicated    Rehab Potential  Good    PT Frequency  3x / week    PT Duration  6 weeks    PT Treatment/Interventions  ADLs/Self Care Home Management;Cryotherapy;Electrical Stimulation;Moist Heat;Iontophoresis 4mg /ml Dexamethasone;Gait training;Stair training;Functional mobility training;Therapeutic activities;Therapeutic exercise;Balance training;Neuromuscular re-education;Manual techniques;Passive range of motion;Patient/family education;Scar mobilization;Vasopneumatic Device;Taping    PT  Next Visit Plan  cont with POC for ROM and strength progression/ modalities PRN for pain/edema MD note next visit    Consulted and Agree with Plan of Care  Patient       Patient will benefit from skilled therapeutic intervention in order to improve the following deficits and impairments:  Abnormal gait, Decreased activity tolerance, Decreased balance, Increased edema, Decreased strength, Decreased range of motion, Difficulty walking, Pain  Visit Diagnosis: Acute pain of left knee  Stiffness of left knee, not elsewhere classified  Muscle weakness (generalized)  Localized edema     Problem List Patient Active Problem List   Diagnosis Date Noted  . Encounter for well woman exam with routine gynecological exam 03/25/2018  . Screening for colorectal cancer 03/25/2018  . Fecal occult blood test positive 03/25/2018  . Current use of estrogen therapy 03/25/2018  . Orthostatic hypotension 05/03/2017  . Syncope due to orthostatic hypotension 05/03/2017  . Symptomatic cholelithiasis 05/02/2017  . Acute cholecystitis 05/02/2017  . Abnormal nuclear stress test   . Chest pain 04/27/2017  . Hyperglycemia 04/27/2017  . Anemia 04/27/2017  . Essential hypertension 07/15/2014  . Mild persistent asthma without complication [id number]  . Vitamin D deficiency 07/15/2014    Phillips Climes, PTA 09/01/2019, 12:04 PM  [facility] [place], [place], [address] Phone: [phone]   Fax:  [phone]  Name: Traci Johnson MRN: [id number] Date of Birth: March 10, 1951

## 2019-09-03 ENCOUNTER — Other Ambulatory Visit: Payer: Self-pay

## 2019-09-03 ENCOUNTER — Ambulatory Visit: Payer: Medicare Other | Admitting: Physical Therapy

## 2019-09-03 ENCOUNTER — Encounter: Payer: Self-pay | Admitting: Physical Therapy

## 2019-09-03 DIAGNOSIS — M25562 Pain in left knee: Secondary | ICD-10-CM

## 2019-09-03 DIAGNOSIS — M25662 Stiffness of left knee, not elsewhere classified: Secondary | ICD-10-CM

## 2019-09-03 DIAGNOSIS — M6281 Muscle weakness (generalized): Secondary | ICD-10-CM

## 2019-09-03 DIAGNOSIS — R600 Localized edema: Secondary | ICD-10-CM | POA: Diagnosis not present

## 2019-09-03 DIAGNOSIS — M1712 Unilateral primary osteoarthritis, left knee: Secondary | ICD-10-CM | POA: Diagnosis not present

## 2019-09-03 NOTE — Therapy (Signed)
[facility] [place], [place], [address] Phone: [phone]   Fax:  [phone]  Physical Therapy Treatment Progress Note Reporting Period 08/14/2019 to 09/03/2019  See note below for Objective Data and Assessment of Progress/Goals. Patient is making gains towards goals with ROM but pain is still limiting factor with sitting. Gabriela Eves, PT, DPT       Patient Details  Name: Traci Johnson MRN: [id number] Date of Birth: 02-01-1951 Referring Provider (PT): Edmonia Lynch, MD   Encounter Date: 09/03/2019  PT End of Session - 09/03/19 1152    Visit Number  9    Number of Visits  18    Date for PT Re-Evaluation  10/02/19    Authorization Type  FOTO, Progress note every 10th visit, KX modifier at 15th visit    PT Start Time  1115    PT Stop Time  1204    PT Time Calculation (min)  49 min    Activity Tolerance  Patient tolerated treatment well    Behavior During Therapy  [facility] for tasks assessed/performed       Past Medical History:  Diagnosis Date  . Allergy   . Asthma   . Hypertension   . Migraines    cluster    Past Surgical History:  Procedure Laterality Date  . ABDOMINAL HYSTERECTOMY  1985  . CHOLECYSTECTOMY N/A 05/02/2017   Procedure: LAPAROSCOPIC CHOLECYSTECTOMY;  Surgeon: Kinsinger, Arta Bruce, MD;  Location: [facility];  Service: General;  Laterality: N/A;  . LEFT HEART CATH AND CORONARY ANGIOGRAPHY N/A 04/30/2017   Procedure: LEFT HEART CATH AND CORONARY ANGIOGRAPHY;  Surgeon: Lorretta Harp, MD;  Location: [place] [facility];  Service: Cardiovascular;  Laterality: N/A;  . LIGAMENT REPAIR Right    birth defect    There were no vitals filed for this visit.  Subjective Assessment - 09/03/19 1124    Subjective  COVID-19 screen performed prior to patient entering clinic.  Patient arrived with less pain and has been able to walk more and get out and perform ADL's, some discomfort in right HS after her  prolong walking yesterday    Pertinent History  left knee TKA 07/31/2019, partial R TKA 05/2018, HTN, Asthma    Limitations  Sitting;Standing;Walking;House hold activities    How long can you sit comfortably?  unable without pain    How long can you stand comfortably?  short periods    How long can you walk comfortably?  household    Patient Stated Goals  decrease pain, return to recreational activities    Currently in Pain?  Yes    Pain Score  2     Pain Location  Knee    Pain Orientation  Left    Pain Descriptors / Indicators  Discomfort    Pain Type  Surgical pain    Pain Onset  More than a month ago    Pain Frequency  Intermittent    Aggravating Factors   prolong standing    Pain Relieving Factors  at rest         [facility] PT Assessment - 09/03/19 0001      AROM   AROM Assessment Site  Knee    Right/Left Knee  Left    Left Knee Extension  -11    Left Knee Flexion  105      PROM   PROM Assessment Site  Knee    Right/Left Knee  Left    Left Knee Extension  -6  Left Knee Flexion  115                   [facility] Adult PT Treatment/Exercise - 09/03/19 0001      Knee/Hip Exercises: Aerobic   Recumbent Bike  91min able to perform forward and backward revolutions    Nustep  37min L3, adjusted for ROM      Electrical Stimulation   Electrical Stimulation Location  L knee    Electrical Stimulation Action  IFC    Electrical Stimulation Parameters  80-150hz  x8min    Electrical Stimulation Goals  Edema;Pain      Vasopneumatic   Number Minutes Vasopneumatic   15 minutes    Vasopnuematic Location   Knee    Vasopneumatic Pressure  Low    Vasopneumatic Temperature   34 for edema      Manual Therapy   Manual Therapy  Passive ROM    Passive ROM  manual PROM for left knee flexion and ext with low load holds to improve mobility                  PT Long Term Goals - 09/03/19 1159      PT LONG TERM GOAL #1   Title  Patient will be independent with HEP and  its progression    Time  6    Period  Weeks    Status  On-going      PT LONG TERM GOAL #2   Title  Patient will demonstrate 115+ degrees of left knee flexion AROM to improve functional tasks.    Time  6    Period  Weeks    Status  On-going   AROM 105 degrees 09/03/19     PT LONG TERM GOAL #3   Title  Patient will demonstrate 3 degrees or less of left knee extension AROM to improve gait mechanics.    Time  6    Period  Weeks    Status  On-going   AROM -11 degrees 09/03/19     PT LONG TERM GOAL #4   Title  Patient will demonstrate 4+/5 or greater left knee and hip strength to improve stability during functional tasks.    Time  6    Period  Weeks    Status  On-going   NT 09/03/19     PT LONG TERM GOAL #5   Title  Patient will report ability to ambulate a community distance without assistive device and left knee pain less than or equal to 3/10.    Time  6    Period  Weeks    Status  On-going   Patient able to walk a community distance yet is using FWW at this time and will transition to cane 09/03/19           Plan - 09/03/19 1201    Clinical Impression Statement  Patient tolerated treatment well today and progressing this week. Patient has improved with ROM in left knee for both flexion and ext. Patient has been walking and able to perform ADL's with greater ease. Today discussed transition to cane in therapy to work on safety with cane and correct technique to improve functional independence. Current goals progressing.    Personal Factors and Comorbidities  Age;Comorbidity 2    Comorbidities  left knee TKA 07/31/2019, partial R TKA 05/2018, HTN, Asthma    Examination-Activity Limitations  Bathing;Bed Mobility;Locomotion Level;Transfers;Stand;Stairs;Carry;Sit    Stability/Clinical Decision Making  Stable/Uncomplicated    Rehab Potential  Good    PT Frequency  3x / week    PT Duration  6 weeks    PT Treatment/Interventions  ADLs/Self Care Home Management;Cryotherapy;Electrical  Stimulation;Moist Heat;Iontophoresis 4mg /ml Dexamethasone;Gait training;Stair training;Functional mobility training;Therapeutic activities;Therapeutic exercise;Balance training;Neuromuscular re-education;Manual techniques;Passive range of motion;Patient/family education;Scar mobilization;Vasopneumatic Device;Taping    PT Next Visit Plan  cont with POC for ROM and strength progression/ modalities PRN for pain/edema MD note sent today 10th visit progress/FOTO next visit    Consulted and Agree with Plan of Care  Patient       Patient will benefit from skilled therapeutic intervention in order to improve the following deficits and impairments:  Abnormal gait, Decreased activity tolerance, Decreased balance, Increased edema, Decreased strength, Decreased range of motion, Difficulty walking, Pain  Visit Diagnosis: Acute pain of left knee  Stiffness of left knee, not elsewhere classified  Muscle weakness (generalized)  Localized edema     Problem List Patient Active Problem List   Diagnosis Date Noted  . Encounter for well woman exam with routine gynecological exam 03/25/2018  . Screening for colorectal cancer 03/25/2018  . Fecal occult blood test positive 03/25/2018  . Current use of estrogen therapy 03/25/2018  . Orthostatic hypotension 05/03/2017  . Syncope due to orthostatic hypotension 05/03/2017  . Symptomatic cholelithiasis 05/02/2017  . Acute cholecystitis 05/02/2017  . Abnormal nuclear stress test   . Chest pain 04/27/2017  . Hyperglycemia 04/27/2017  . Anemia 04/27/2017  . Essential hypertension 07/15/2014  . Mild persistent asthma without complication [id number]  . Vitamin D deficiency 07/15/2014    Ladean Raya, PTA 09/03/19 12:07 PM  [facility] [address] [place], [place], [address] Phone: [phone]   Fax:  [phone]  Name: Lakya Simental MRN: [id number] Date of Birth: [birth year]

## 2019-09-04 ENCOUNTER — Encounter: Payer: Medicare Other | Admitting: Physical Therapy

## 2019-09-08 ENCOUNTER — Other Ambulatory Visit: Payer: Self-pay

## 2019-09-08 ENCOUNTER — Ambulatory Visit: Payer: Medicare Other | Attending: Orthopedic Surgery | Admitting: Physical Therapy

## 2019-09-08 ENCOUNTER — Encounter: Payer: Self-pay | Admitting: Physical Therapy

## 2019-09-08 DIAGNOSIS — M25662 Stiffness of left knee, not elsewhere classified: Secondary | ICD-10-CM

## 2019-09-08 DIAGNOSIS — M25562 Pain in left knee: Secondary | ICD-10-CM | POA: Diagnosis not present

## 2019-09-08 DIAGNOSIS — R600 Localized edema: Secondary | ICD-10-CM | POA: Diagnosis not present

## 2019-09-08 DIAGNOSIS — M6281 Muscle weakness (generalized): Secondary | ICD-10-CM | POA: Diagnosis not present

## 2019-09-08 NOTE — Therapy (Signed)
[facility] [place], [place], [address] Phone: [phone]   Fax:  [phone]  Physical Therapy Treatment  Patient Details  Name: Traci Johnson MRN: [id number] Date of Birth: January 18, 1951 Referring Provider (PT): Edmonia Lynch, MD   Encounter Date: 09/08/2019  PT End of Session - 09/08/19 1039    Visit Number  10    Number of Visits  18    Date for PT Re-Evaluation  10/02/19    Authorization Type  FOTO 10th visit @ 43%, Progress note needed on 19th visit, KX modifier at 15th visit    PT Start Time  1030    PT Stop Time  1126    PT Time Calculation (min)  56 min    Activity Tolerance  Patient tolerated treatment well    Behavior During Therapy  [facility] for tasks assessed/performed       Past Medical History:  Diagnosis Date  . Allergy   . Asthma   . Hypertension   . Migraines    cluster    Past Surgical History:  Procedure Laterality Date  . ABDOMINAL HYSTERECTOMY  1985  . CHOLECYSTECTOMY N/A 05/02/2017   Procedure: LAPAROSCOPIC CHOLECYSTECTOMY;  Surgeon: Kinsinger, Arta Bruce, MD;  Location: [facility];  Service: General;  Laterality: N/A;  . LEFT HEART CATH AND CORONARY ANGIOGRAPHY N/A 04/30/2017   Procedure: LEFT HEART CATH AND CORONARY ANGIOGRAPHY;  Surgeon: Lorretta Harp, MD;  Location: [place] [facility];  Service: Cardiovascular;  Laterality: N/A;  . LIGAMENT REPAIR Right    birth defect    There were no vitals filed for this visit.  Subjective Assessment - 09/08/19 1033    Subjective  COVID-19 screen performed prior to patient entering clinic.  Patient arrived with quad cane today and feeling overall improved.    Pertinent History  left knee TKA 07/31/2019, partial R TKA 05/2018, HTN, Asthma    Limitations  Sitting;Standing;Walking;House hold activities    How long can you sit comfortably?  unable without pain    How long can you stand comfortably?  short periods    How long can you walk comfortably?   household    Patient Stated Goals  decrease pain, return to recreational activities    Currently in Pain?  Yes    Pain Score  2     Pain Location  Knee    Pain Orientation  Left    Pain Descriptors / Indicators  Discomfort    Pain Type  Surgical pain    Pain Onset  More than a month ago    Pain Frequency  Intermittent    Aggravating Factors   prolong standing/bending knee    Pain Relieving Factors  rest         [facility] PT Assessment - 09/08/19 0001      AROM   AROM Assessment Site  Knee    Right/Left Knee  Left    Left Knee Extension  -9    Left Knee Flexion  109      PROM   PROM Assessment Site  Knee    Right/Left Knee  Left    Left Knee Extension  -5    Left Knee Flexion  119                   [facility] Adult PT Treatment/Exercise - 09/08/19 0001      Knee/Hip Exercises: Aerobic   Recumbent Bike  3min L2 for ROM    Nustep  75min  L3, adjusted for ROM      Knee/Hip Exercises: Standing   Forward Step Up  Left;10 reps;Step Height: 6";3 sets    Rocker Board  2 minutes      Acupuncturist Location  L knee    Electrical Stimulation Action  IFC    Electrical Stimulation Parameters  80-150hz  x47min    Electrical Stimulation Goals  Edema;Pain      Vasopneumatic   Number Minutes Vasopneumatic   15 minutes    Vasopnuematic Location   Knee    Vasopneumatic Pressure  Low    Vasopneumatic Temperature   34 for edema      Manual Therapy   Manual Therapy  Passive ROM    Passive ROM  manual PROM for left knee flexion and ext with low load holds to improve mobility                  PT Long Term Goals - 09/08/19 1123      PT LONG TERM GOAL #1   Title  Patient will be independent with HEP and its progression    Time  6    Period  Weeks    Status  On-going      PT LONG TERM GOAL #2   Title  Patient will demonstrate 115+ degrees of left knee flexion AROM to improve functional tasks.    Time  6    Period  Weeks     Status  On-going   AROM 109 degrees 09/08/19     PT LONG TERM GOAL #3   Title  Patient will demonstrate 3 degrees or less of left knee extension AROM to improve gait mechanics.    Time  6    Period  Weeks    Status  --   AROM -9 degrees 09/08/19     PT LONG TERM GOAL #4   Title  Patient will demonstrate 4+/5 or greater left knee and hip strength to improve stability during functional tasks.    Time  6    Period  Weeks    Status  On-going      PT LONG TERM GOAL #5   Title  Patient will report ability to ambulate a community distance without assistive device and left knee pain less than or equal to 3/10.    Time  6    Period  Weeks    Status  On-going            Plan - 09/08/19 1114    Clinical Impression Statement  Patient tolerated treatment well and able to progress overall with ROM and pain. Patient arrived qith quad cane and able to ambulate with good form today. Patient improved flexion and ext active and passive today. Patient goals are progressing.    Personal Factors and Comorbidities  Age;Comorbidity 2    Comorbidities  left knee TKA 07/31/2019, partial R TKA 05/2018, HTN, Asthma    Examination-Activity Limitations  Bathing;Bed Mobility;Locomotion Level;Transfers;Stand;Stairs;Carry;Sit    Stability/Clinical Decision Making  Stable/Uncomplicated    Rehab Potential  Good    PT Frequency  3x / week    PT Duration  6 weeks    PT Treatment/Interventions  ADLs/Self Care Home Management;Cryotherapy;Electrical Stimulation;Moist Heat;Iontophoresis 4mg /ml Dexamethasone;Gait training;Stair training;Functional mobility training;Therapeutic activities;Therapeutic exercise;Balance training;Neuromuscular re-education;Manual techniques;Passive range of motion;Patient/family education;Scar mobilization;Vasopneumatic Device;Taping    PT Next Visit Plan  cont with POC for ROM and strength progression/ modalities PRN for pain/edema    Consulted and  Agree with Plan of Care  Patient        Patient will benefit from skilled therapeutic intervention in order to improve the following deficits and impairments:  Abnormal gait, Decreased activity tolerance, Decreased balance, Increased edema, Decreased strength, Decreased range of motion, Difficulty walking, Pain  Visit Diagnosis: Stiffness of left knee, not elsewhere classified  Acute pain of left knee  Muscle weakness (generalized)  Localized edema     Problem List Patient Active Problem List   Diagnosis Date Noted  . Encounter for well woman exam with routine gynecological exam 03/25/2018  . Screening for colorectal cancer 03/25/2018  . Fecal occult blood test positive 03/25/2018  . Current use of estrogen therapy 03/25/2018  . Orthostatic hypotension 05/03/2017  . Syncope due to orthostatic hypotension 05/03/2017  . Symptomatic cholelithiasis 05/02/2017  . Acute cholecystitis 05/02/2017  . Abnormal nuclear stress test   . Chest pain 04/27/2017  . Hyperglycemia 04/27/2017  . Anemia 04/27/2017  . Essential hypertension 07/15/2014  . Mild persistent asthma without complication [id number]  . Vitamin D deficiency 07/15/2014    Phillips Climes, PTA 09/08/2019, 11:29 AM  [facility] [place], [place], [address] Phone: [phone]   Fax:  [phone]  Name: Traci Johnson MRN: [id number] Date of Birth: 12/23/1950

## 2019-09-10 ENCOUNTER — Encounter: Payer: Self-pay | Admitting: Physical Therapy

## 2019-09-10 ENCOUNTER — Other Ambulatory Visit: Payer: Self-pay

## 2019-09-10 ENCOUNTER — Ambulatory Visit: Payer: Medicare Other | Admitting: Physical Therapy

## 2019-09-10 DIAGNOSIS — M25562 Pain in left knee: Secondary | ICD-10-CM

## 2019-09-10 DIAGNOSIS — M25662 Stiffness of left knee, not elsewhere classified: Secondary | ICD-10-CM

## 2019-09-10 DIAGNOSIS — R600 Localized edema: Secondary | ICD-10-CM

## 2019-09-10 DIAGNOSIS — M6281 Muscle weakness (generalized): Secondary | ICD-10-CM

## 2019-09-10 NOTE — Therapy (Signed)
[facility] [place], [place], [address] Phone: [phone]   Fax:  [phone]  Physical Therapy Treatment  Patient Details  Name: Traci Johnson MRN: [id number] Date of Birth: April 09, 1951 Referring Provider (PT): Edmonia Lynch, MD   Encounter Date: 09/10/2019  PT End of Session - 09/10/19 0916    Visit Number  11    Number of Visits  18    Date for PT Re-Evaluation  10/02/19    Authorization Type  FOTO 10th visit @ 43%, Progress note needed on 19th visit, KX modifier at 15th visit    PT Start Time  0815    PT Stop Time  0908    PT Time Calculation (min)  53 min    Equipment Utilized During Treatment  Other (comment)    Activity Tolerance  Patient tolerated treatment well    Behavior During Therapy  [facility] for tasks assessed/performed       Past Medical History:  Diagnosis Date  . Allergy   . Asthma   . Hypertension   . Migraines    cluster    Past Surgical History:  Procedure Laterality Date  . ABDOMINAL HYSTERECTOMY  1985  . CHOLECYSTECTOMY N/A 05/02/2017   Procedure: LAPAROSCOPIC CHOLECYSTECTOMY;  Surgeon: Kinsinger, Arta Bruce, MD;  Location: [facility];  Service: General;  Laterality: N/A;  . LEFT HEART CATH AND CORONARY ANGIOGRAPHY N/A 04/30/2017   Procedure: LEFT HEART CATH AND CORONARY ANGIOGRAPHY;  Surgeon: Lorretta Harp, MD;  Location: [place] [facility];  Service: Cardiovascular;  Laterality: N/A;  . LIGAMENT REPAIR Right    birth defect    There were no vitals filed for this visit.  Subjective Assessment - 09/10/19 0836    Subjective  COVID-19 screen performed prior to patient entering clinic.  Patient reported increased stiffness and was both up on her feet and sitting a lot over the weekend    Pertinent History  left knee TKA 07/31/2019, partial R TKA 05/2018, HTN, Asthma    Limitations  Sitting;Standing;Walking;House hold activities    How long can you sit comfortably?  unable without pain; 30 mins  09/10/2019    How long can you stand comfortably?  short periods    How long can you walk comfortably?  household    Patient Stated Goals  decrease pain, return to recreational activities    Currently in Pain?  Yes    Pain Score  4     Pain Location  Knee    Pain Orientation  Left    Pain Descriptors / Indicators  Discomfort    Pain Type  Surgical pain    Pain Onset  More than a month ago    Pain Frequency  Intermittent         [facility] PT Assessment - 09/10/19 0001      Assessment   Medical Diagnosis  S/P left total knee replacement    Referring Provider (PT)  Edmonia Lynch, MD    Onset Date/Surgical Date  07/31/19    Next MD Visit  10/08/2019                   [facility] Adult PT Treatment/Exercise - 09/10/19 0001      Knee/Hip Exercises: Stretches   Passive Hamstring Stretch  Left;4 reps;30 seconds      Knee/Hip Exercises: Aerobic   Recumbent Bike  75min L2 for ROM    Nustep  25min L4, adjusted for ROM      Knee/Hip  Exercises: Seated   Long Arc Quad  Strengthening;Left;2 sets;10 reps    Long Arc Quad Weight  3 lbs.      Knee/Hip Exercises: Supine   Heel Prop for Knee Extension  3 minutes    Heel Prop for Knee Extension Weight (lbs)  3      Electrical Stimulation   Electrical Stimulation Location  L knee    Electrical Stimulation Action  IFC    Electrical Stimulation Parameters  80-150 hz x10 mins    Electrical Stimulation Goals  Edema;Pain      Vasopneumatic   Number Minutes Vasopneumatic   15 minutes    Vasopnuematic Location   Knee    Vasopneumatic Pressure  Low    Vasopneumatic Temperature   55 for edema      Manual Therapy   Manual Therapy  Passive ROM    Manual therapy comments  patella mobs in all directions    Passive ROM  manual PROM for left knee flexion and ext with low load holds to improve mobility                  PT Long Term Goals - 09/08/19 1123      PT LONG TERM GOAL #1   Title  Patient will be independent with HEP and its  progression    Time  6    Period  Weeks    Status  On-going      PT LONG TERM GOAL #2   Title  Patient will demonstrate 115+ degrees of left knee flexion AROM to improve functional tasks.    Time  6    Period  Weeks    Status  On-going   AROM 109 degrees 09/08/19     PT LONG TERM GOAL #3   Title  Patient will demonstrate 3 degrees or less of left knee extension AROM to improve gait mechanics.    Time  6    Period  Weeks    Status  --   AROM -9 degrees 09/08/19     PT LONG TERM GOAL #4   Title  Patient will demonstrate 4+/5 or greater left knee and hip strength to improve stability during functional tasks.    Time  6    Period  Weeks    Status  On-going      PT LONG TERM GOAL #5   Title  Patient will report ability to ambulate a community distance without assistive device and left knee pain less than or equal to 3/10.    Time  6    Period  Weeks    Status  On-going            Plan - 09/10/19 0907    Clinical Impression Statement  Patient responded well to therapy session despite reports of feeling stiff. Paient was able to progress strengthening TEs with no reports of increased pain. Patient noted with smooth PROM into flexion and extension. No adverse affects upon removal of modalities.    Personal Factors and Comorbidities  Age;Comorbidity 2    Comorbidities  left knee TKA 07/31/2019, partial R TKA 05/2018, HTN, Asthma    Examination-Activity Limitations  Bathing;Bed Mobility;Locomotion Level;Transfers;Stand;Stairs;Carry;Sit    Stability/Clinical Decision Making  Stable/Uncomplicated    Clinical Decision Making  Low    Rehab Potential  Good    PT Frequency  3x / week    PT Duration  6 weeks    PT Treatment/Interventions  ADLs/Self Care Home Management;Cryotherapy;Electrical Stimulation;Moist  Heat;Iontophoresis 4mg /ml Dexamethasone;Gait training;Stair training;Functional mobility training;Therapeutic activities;Therapeutic exercise;Balance training;Neuromuscular  re-education;Manual techniques;Passive range of motion;Patient/family education;Scar mobilization;Vasopneumatic Device;Taping    PT Next Visit Plan  cont with POC for ROM and strength progression/ modalities PRN for pain/edema    Consulted and Agree with Plan of Care  Patient       Patient will benefit from skilled therapeutic intervention in order to improve the following deficits and impairments:  Abnormal gait, Decreased activity tolerance, Decreased balance, Increased edema, Decreased strength, Decreased range of motion, Difficulty walking, Pain  Visit Diagnosis: Stiffness of left knee, not elsewhere classified  Acute pain of left knee  Muscle weakness (generalized)  Localized edema     Problem List Patient Active Problem List   Diagnosis Date Noted  . Encounter for well woman exam with routine gynecological exam 03/25/2018  . Screening for colorectal cancer 03/25/2018  . Fecal occult blood test positive 03/25/2018  . Current use of estrogen therapy 03/25/2018  . Orthostatic hypotension 05/03/2017  . Syncope due to orthostatic hypotension 05/03/2017  . Symptomatic cholelithiasis 05/02/2017  . Acute cholecystitis 05/02/2017  . Abnormal nuclear stress test   . Chest pain 04/27/2017  . Hyperglycemia 04/27/2017  . Anemia 04/27/2017  . Essential hypertension 07/15/2014  . Mild persistent asthma without complication [id number]  . Vitamin D deficiency 07/15/2014    Gabriela Eves, PT, DPT 09/10/2019, 10:23 AM  [facility] [address] [place], [place], [address] Phone: [phone]   Fax:  [phone]  Name: Taiwanna Brugh MRN: [id number] Date of Birth: November 17, 1950

## 2019-09-12 ENCOUNTER — Ambulatory Visit: Payer: Medicare Other | Admitting: Physical Therapy

## 2019-09-12 ENCOUNTER — Encounter: Payer: Self-pay | Admitting: Physical Therapy

## 2019-09-12 ENCOUNTER — Other Ambulatory Visit: Payer: Self-pay

## 2019-09-12 DIAGNOSIS — M25662 Stiffness of left knee, not elsewhere classified: Secondary | ICD-10-CM | POA: Diagnosis not present

## 2019-09-12 DIAGNOSIS — M25562 Pain in left knee: Secondary | ICD-10-CM

## 2019-09-12 DIAGNOSIS — M6281 Muscle weakness (generalized): Secondary | ICD-10-CM | POA: Diagnosis not present

## 2019-09-12 DIAGNOSIS — R600 Localized edema: Secondary | ICD-10-CM | POA: Diagnosis not present

## 2019-09-12 NOTE — Therapy (Signed)
[facility] [place], [place], [address] Phone: [phone]   Fax:  [phone]  Physical Therapy Treatment  Patient Details  Name: Traci Johnson MRN: [id number] Date of Birth: 10-14-1950 Referring Provider (PT): Edmonia Lynch, MD   Encounter Date: 09/12/2019  PT End of Session - 09/12/19 1110    Visit Number  12    Number of Visits  18    Date for PT Re-Evaluation  10/02/19    Authorization Type  FOTO 10th visit @ 43%, Progress note needed on 19th visit, KX modifier at 15th visit    PT Start Time  1030    PT Stop Time  1121    PT Time Calculation (min)  51 min    Activity Tolerance  Patient tolerated treatment well    Behavior During Therapy  [facility] for tasks assessed/performed       Past Medical History:  Diagnosis Date  . Allergy   . Asthma   . Hypertension   . Migraines    cluster    Past Surgical History:  Procedure Laterality Date  . ABDOMINAL HYSTERECTOMY  1985  . CHOLECYSTECTOMY N/A 05/02/2017   Procedure: LAPAROSCOPIC CHOLECYSTECTOMY;  Surgeon: Kinsinger, Arta Bruce, MD;  Location: [facility];  Service: General;  Laterality: N/A;  . LEFT HEART CATH AND CORONARY ANGIOGRAPHY N/A 04/30/2017   Procedure: LEFT HEART CATH AND CORONARY ANGIOGRAPHY;  Surgeon: Lorretta Harp, MD;  Location: [facility];  Service: Cardiovascular;  Laterality: N/A;  . LIGAMENT REPAIR Right    birth defect    There were no vitals filed for this visit.  Subjective Assessment - 09/12/19 1105    Subjective  COVID-19 screen performed prior to patient entering clinic.  Still using using walker because my back hurts.    Pertinent History  left knee TKA 07/31/2019, partial R TKA 05/2018, HTN, Asthma    Limitations  Sitting;Standing;Walking;House hold activities    How long can you sit comfortably?  unable without pain; 30 mins 09/10/2019    How long can you stand comfortably?  short periods    How long can you walk comfortably?   household    Patient Stated Goals  decrease pain, return to recreational activities    Currently in Pain?  Yes    Pain Score  4     Pain Location  Knee    Pain Orientation  Left    Pain Descriptors / Indicators  Discomfort    Pain Type  Surgical pain    Pain Onset  More than a month ago                       [facility] Adult PT Treatment/Exercise - 09/12/19 0001      Exercises   Exercises  Knee/Hip      Knee/Hip Exercises: Aerobic   Recumbent Bike  10 minutes.    Nustep  5 minutes at level 4.      Knee/Hip Exercises: Machines for Strengthening   Cybex Knee Extension  10# x 3 minutes.    Cybex Knee Flexion  20# x 3 minutes.      Modalities   Modalities  Health visitor Stimulation Location  Left knee.    Electrical Stimulation Action  IFC    Electrical Stimulation Parameters  1-10 Hz x 20 minutes.    Electrical Stimulation Goals  Edema;Pain      Vasopneumatic  Number Minutes Vasopneumatic   20 minutes    Vasopnuematic Location   --   Left knee   Vasopneumatic Pressure  Low      Manual Therapy   Manual Therapy  Passive ROM    Passive ROM  In supine:  PROM x 3 minutes to patient's left knee to tolerance.                  PT Long Term Goals - 09/08/19 1123      PT LONG TERM GOAL #1   Title  Patient will be independent with HEP and its progression    Time  6    Period  Weeks    Status  On-going      PT LONG TERM GOAL #2   Title  Patient will demonstrate 115+ degrees of left knee flexion AROM to improve functional tasks.    Time  6    Period  Weeks    Status  On-going   AROM 109 degrees 09/08/19     PT LONG TERM GOAL #3   Title  Patient will demonstrate 3 degrees or less of left knee extension AROM to improve gait mechanics.    Time  6    Period  Weeks    Status  --   AROM -9 degrees 09/08/19     PT LONG TERM GOAL #4   Title  Patient will demonstrate 4+/5 or greater left knee  and hip strength to improve stability during functional tasks.    Time  6    Period  Weeks    Status  On-going      PT LONG TERM GOAL #5   Title  Patient will report ability to ambulate a community distance without assistive device and left knee pain less than or equal to 3/10.    Time  6    Period  Weeks    Status  On-going            Plan - 09/12/19 1108    Clinical Impression Statement  Patient did very well with the addition of weight machines and she performed without complaint.    Personal Factors and Comorbidities  Age;Comorbidity 2    Comorbidities  left knee TKA 07/31/2019, partial R TKA 05/2018, HTN, Asthma    Examination-Activity Limitations  Bathing;Bed Mobility;Locomotion Level;Transfers;Stand;Stairs;Carry;Sit    Stability/Clinical Decision Making  Stable/Uncomplicated    Rehab Potential  Good    PT Frequency  3x / week    PT Duration  6 weeks    PT Treatment/Interventions  ADLs/Self Care Home Management;Cryotherapy;Electrical Stimulation;Moist Heat;Iontophoresis 4mg /ml Dexamethasone;Gait training;Stair training;Functional mobility training;Therapeutic activities;Therapeutic exercise;Balance training;Neuromuscular re-education;Manual techniques;Passive range of motion;Patient/family education;Scar mobilization;Vasopneumatic Device;Taping    PT Next Visit Plan  cont with POC for ROM and strength progression/ modalities PRN for pain/edema    PT Home Exercise Plan  continue HEP provided by HHPT, educated on importance of early mobility.    Consulted and Agree with Plan of Care  Patient       Patient will benefit from skilled therapeutic intervention in order to improve the following deficits and impairments:  Abnormal gait, Decreased activity tolerance, Decreased balance, Increased edema, Decreased strength, Decreased range of motion, Difficulty walking, Pain  Visit Diagnosis: Stiffness of left knee, not elsewhere classified  Acute pain of left knee     Problem  List Patient Active Problem List   Diagnosis Date Noted  . Encounter for well woman exam with routine gynecological exam 03/25/2018  .  Screening for colorectal cancer 03/25/2018  . Fecal occult blood test positive 03/25/2018  . Current use of estrogen therapy 03/25/2018  . Orthostatic hypotension 05/03/2017  . Syncope due to orthostatic hypotension 05/03/2017  . Symptomatic cholelithiasis 05/02/2017  . Acute cholecystitis 05/02/2017  . Abnormal nuclear stress test   . Chest pain 04/27/2017  . Hyperglycemia 04/27/2017  . Anemia 04/27/2017  . Essential hypertension 07/15/2014  . Mild persistent asthma without complication [id number]  . Vitamin D deficiency 07/15/2014    Eean Buss, [place] MPT 09/12/2019, 11:23 AM  [facility] [address] [place], [place], [address] Phone: [phone]   Fax:  [phone]  Name: Traci Johnson MRN: [id number] Date of Birth: 04-14-1951

## 2019-09-15 ENCOUNTER — Encounter: Payer: Self-pay | Admitting: Physical Therapy

## 2019-09-15 ENCOUNTER — Ambulatory Visit: Payer: Medicare Other | Admitting: Physical Therapy

## 2019-09-15 ENCOUNTER — Other Ambulatory Visit: Payer: Self-pay

## 2019-09-15 DIAGNOSIS — M6281 Muscle weakness (generalized): Secondary | ICD-10-CM | POA: Diagnosis not present

## 2019-09-15 DIAGNOSIS — M25662 Stiffness of left knee, not elsewhere classified: Secondary | ICD-10-CM

## 2019-09-15 DIAGNOSIS — M25562 Pain in left knee: Secondary | ICD-10-CM | POA: Diagnosis not present

## 2019-09-15 DIAGNOSIS — R600 Localized edema: Secondary | ICD-10-CM | POA: Diagnosis not present

## 2019-09-15 NOTE — Therapy (Signed)
[facility] [place], [place], [address] Phone: [phone]   Fax:  [phone]  Physical Therapy Treatment  Patient Details  Name: Traci Johnson MRN: [id number] Date of Birth: 1951-03-06 Referring Provider (PT): Edmonia Lynch, MD   Encounter Date: 09/15/2019  PT End of Session - 09/15/19 1105    Visit Number  13    Number of Visits  18    Date for PT Re-Evaluation  10/02/19    Authorization Type  FOTO 10th visit @ 43%, Progress note needed on 19th visit, KX modifier at 15th visit    PT Start Time  1031    PT Stop Time  1118    PT Time Calculation (min)  47 min    Activity Tolerance  Patient tolerated treatment well    Behavior During Therapy  [facility] for tasks assessed/performed       Past Medical History:  Diagnosis Date  . Allergy   . Asthma   . Hypertension   . Migraines    cluster    Past Surgical History:  Procedure Laterality Date  . ABDOMINAL HYSTERECTOMY  1985  . CHOLECYSTECTOMY N/A 05/02/2017   Procedure: LAPAROSCOPIC CHOLECYSTECTOMY;  Surgeon: Kinsinger, Arta Bruce, MD;  Location: [facility];  Service: General;  Laterality: N/A;  . LEFT HEART CATH AND CORONARY ANGIOGRAPHY N/A 04/30/2017   Procedure: LEFT HEART CATH AND CORONARY ANGIOGRAPHY;  Surgeon: Lorretta Harp, MD;  Location: [facility];  Service: Cardiovascular;  Laterality: N/A;  . LIGAMENT REPAIR Right    birth defect    There were no vitals filed for this visit.  Subjective Assessment - 09/15/19 1037    Subjective  COVID-19 screen performed prior to patient entering clinic.  Patient reported less discomfort and using cane and sometimes no assist device.    Pertinent History  left knee TKA 07/31/2019, partial R TKA 05/2018, HTN, Asthma    Limitations  Sitting;Standing;Walking;House hold activities    How long can you sit comfortably?  unable without pain; 30 mins 09/10/2019    How long can you stand comfortably?  short periods    How  long can you walk comfortably?  household    Patient Stated Goals  decrease pain, return to recreational activities    Currently in Pain?  Yes    Pain Score  4     Pain Location  Knee    Pain Orientation  Left    Pain Descriptors / Indicators  Discomfort    Pain Type  Surgical pain    Pain Onset  More than a month ago    Pain Frequency  Intermittent    Aggravating Factors   bening knee and increased activity    Pain Relieving Factors  at rest         [facility] PT Assessment - 09/15/19 0001      AROM   AROM Assessment Site  Knee    Right/Left Knee  Left    Left Knee Extension  -6    Left Knee Flexion  115      PROM   PROM Assessment Site  Knee    Right/Left Knee  Left    Left Knee Extension  -3    Left Knee Flexion  120                   [facility] Adult PT Treatment/Exercise - 09/15/19 0001      Knee/Hip Exercises: Aerobic   Recumbent Bike  36mn L1,  adjusted for ROM      Knee/Hip Exercises: Machines for Strengthening   Cybex Knee Extension  10# x 3 minutes.    Cybex Knee Flexion  20# x 3 minutes.      Knee/Hip Exercises: Supine   Straight Leg Raises  Strengthening;20 reps;Left    Other Supine Knee/Hip Exercises  clam with red t-band x20      Electrical Stimulation   Electrical Stimulation Location  Left knee.    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C    Electrical Stimulation Parameters  1-_0  x55mn    Electrical Stimulation Goals  Edema;Pain      Vasopneumatic   Number Minutes Vasopneumatic   10 minutes    Vasopnuematic Location   Knee    Vasopneumatic Pressure  Low    Vasopneumatic Temperature   34 for edema      Manual Therapy   Manual Therapy  Passive ROM    Passive ROM  manual PROM / Stretching to leftknee for flexion and ext with low load holds to improve mobility                  PT Long Term Goals - 09/15/19 1056      PT LONG TERM GOAL #1   Title  Patient will be independent with HEP and its progression    Time  6    Period   Weeks      PT LONG TERM GOAL #2   Title  Patient will demonstrate 115+ degrees of left knee flexion AROM to improve functional tasks.    Baseline  MET 09/15/19    Time  6    Period  Weeks    Status  Achieved   AROM 115 degrees     PT LONG TERM GOAL #3   Title  Patient will demonstrate 3 degrees or less of left knee extension AROM to improve gait mechanics.    Time  6    Period  Weeks    Status  On-going      PT LONG TERM GOAL #4   Title  Patient will demonstrate 4+/5 or greater left knee and hip strength to improve stability during functional tasks.    Time  6    Period  Weeks    Status  On-going      PT LONG TERM GOAL #5   Title  Patient will report ability to ambulate a community distance without assistive device and left knee pain less than or equal to 3/10.    Time  6    Period  Weeks    Status  On-going            Plan - 09/15/19 1110    Clinical Impression Statement  Patient tolerated treatment well today. Patient improved ROM in left knee for flexion and ext today. Patient has also reported decreased pain in knee. Patient able to progress with exercises today with no increased pain. Patient met LTG #2 today with others progressing.    Personal Factors and Comorbidities  Age;Comorbidity 2    Comorbidities  left knee TKA 07/31/2019, partial R TKA 05/2018, HTN, Asthma    Examination-Activity Limitations  Bathing;Bed Mobility;Locomotion Level;Transfers;Stand;Stairs;Carry;Sit    Stability/Clinical Decision Making  Stable/Uncomplicated    Rehab Potential  Good    PT Frequency  3x / week    PT Duration  6 weeks    PT Treatment/Interventions  ADLs/Self Care Home Management;Cryotherapy;Electrical Stimulation;Moist Heat;Iontophoresis 430mml Dexamethasone;Gait training;Stair training;Functional mobility training;Therapeutic activities;Therapeutic exercise;Balance training;Neuromuscular  re-education;Manual techniques;Passive range of motion;Patient/family education;Scar  mobilization;Vasopneumatic Device;Taping    PT Next Visit Plan  cont with POC for ROM for ext and strength progression/ modalities PRN for pain/edema    Consulted and Agree with Plan of Care  Patient       Patient will benefit from skilled therapeutic intervention in order to improve the following deficits and impairments:  Abnormal gait, Decreased activity tolerance, Decreased balance, Increased edema, Decreased strength, Decreased range of motion, Difficulty walking, Pain  Visit Diagnosis: Stiffness of left knee, not elsewhere classified  Acute pain of left knee  Muscle weakness (generalized)  Localized edema     Problem List Patient Active Problem List   Diagnosis Date Noted  . Encounter for well woman exam with routine gynecological exam 03/25/2018  . Screening for colorectal cancer 03/25/2018  . Fecal occult blood test positive 03/25/2018  . Current use of estrogen therapy 03/25/2018  . Orthostatic hypotension 05/03/2017  . Syncope due to orthostatic hypotension 05/03/2017  . Symptomatic cholelithiasis 05/02/2017  . Acute cholecystitis 05/02/2017  . Abnormal nuclear stress test   . Chest pain 04/27/2017  . Hyperglycemia 04/27/2017  . Anemia 04/27/2017  . Essential hypertension 07/15/2014  . Mild persistent asthma without complication 26/41/5830  . Vitamin D deficiency 07/15/2014    Phillips Climes, PTA 09/15/2019, 11:51 AM  [facility] [place], [place], [address] Phone: [phone]   Fax:  [phone]  Name: Traci Johnson MRN: [id number] Date of Birth: January 01, 1951

## 2019-09-17 ENCOUNTER — Ambulatory Visit: Payer: Medicare Other | Admitting: Physical Therapy

## 2019-09-17 ENCOUNTER — Other Ambulatory Visit: Payer: Self-pay

## 2019-09-17 DIAGNOSIS — M25562 Pain in left knee: Secondary | ICD-10-CM | POA: Diagnosis not present

## 2019-09-17 DIAGNOSIS — M25662 Stiffness of left knee, not elsewhere classified: Secondary | ICD-10-CM

## 2019-09-17 DIAGNOSIS — R600 Localized edema: Secondary | ICD-10-CM | POA: Diagnosis not present

## 2019-09-17 DIAGNOSIS — M6281 Muscle weakness (generalized): Secondary | ICD-10-CM | POA: Diagnosis not present

## 2019-09-17 NOTE — Therapy (Signed)
[facility] [place], [place], [address] Phone: [phone]   Fax:  [phone]  Physical Therapy Treatment  Patient Details  Name: Traci Johnson MRN: [id number] Date of Birth: 10-20-1950 Referring Provider (PT): Edmonia Lynch, MD   Encounter Date: 09/17/2019  PT End of Session - 09/17/19 1053    Visit Number  14    Number of Visits  18    Date for PT Re-Evaluation  10/02/19    Authorization Type  FOTO 10th visit @ 43%, Progress note needed on 19th visit, KX modifier at 15th visit    PT Start Time  1030    PT Stop Time  1122    PT Time Calculation (min)  52 min    Activity Tolerance  Patient tolerated treatment well    Behavior During Therapy  [facility] for tasks assessed/performed       Past Medical History:  Diagnosis Date  . Allergy   . Asthma   . Hypertension   . Migraines    cluster    Past Surgical History:  Procedure Laterality Date  . ABDOMINAL HYSTERECTOMY  1985  . CHOLECYSTECTOMY N/A 05/02/2017   Procedure: LAPAROSCOPIC CHOLECYSTECTOMY;  Surgeon: Kinsinger, Arta Bruce, MD;  Location: [facility];  Service: General;  Laterality: N/A;  . LEFT HEART CATH AND CORONARY ANGIOGRAPHY N/A 04/30/2017   Procedure: LEFT HEART CATH AND CORONARY ANGIOGRAPHY;  Surgeon: Lorretta Harp, MD;  Location: [place] [facility];  Service: Cardiovascular;  Laterality: N/A;  . LIGAMENT REPAIR Right    birth defect    There were no vitals filed for this visit.  Subjective Assessment - 09/17/19 1054    Subjective  COVID-19 screen performed prior to patient entering clinic.  Knee stiff and burning.  Pulled weed yesterday. Walk about a mile yesterday.    Pertinent History  left knee TKA 07/31/2019, partial R TKA 05/2018, HTN, Asthma    Limitations  Sitting;Standing;Walking;House hold activities    How long can you sit comfortably?  unable without pain; 30 mins 09/10/2019                       [facility] Adult PT  Treatment/Exercise - 09/17/19 0001      Exercises   Exercises  Knee/Hip      Knee/Hip Exercises: Aerobic   Recumbent Bike  15 minutes moving to seat 1.      Knee/Hip Exercises: Machines for Strengthening   Cybex Knee Extension  10# x 3 minutes.    Cybex Knee Flexion  30# x 3 minutes.    Cybex Leg Press  2 plates x 3 minutes.      Modalities   Modalities  Health visitor Stimulation Location  Left knee.    Electrical Stimulation Action  IFC    Electrical Stimulation Parameters  1-10 Hz x 20 minutes.    Electrical Stimulation Goals  Edema;Pain      Vasopneumatic   Number Minutes Vasopneumatic   20 minutes    Vasopnuematic Location   --   Left knee.   Vasopneumatic Pressure  Medium                  PT Long Term Goals - 09/15/19 1056      PT LONG TERM GOAL #1   Title  Patient will be independent with HEP and its progression    Time  6    Period  Weeks      PT LONG TERM GOAL #2   Title  Patient will demonstrate 115+ degrees of left knee flexion AROM to improve functional tasks.    Baseline  MET 09/15/19    Time  6    Period  Weeks    Status  Achieved   AROM 115 degrees     PT LONG TERM GOAL #3   Title  Patient will demonstrate 3 degrees or less of left knee extension AROM to improve gait mechanics.    Time  6    Period  Weeks    Status  On-going      PT LONG TERM GOAL #4   Title  Patient will demonstrate 4+/5 or greater left knee and hip strength to improve stability during functional tasks.    Time  6    Period  Weeks    Status  On-going      PT LONG TERM GOAL #5   Title  Patient will report ability to ambulate a community distance without assistive device and left knee pain less than or equal to 3/10.    Time  6    Period  Weeks    Status  On-going            Plan - 09/17/19 1106    Clinical Impression Statement  Patienr did great today.  Stiff and sore from a very active day  yesterday.  Added leg press and patient did very well without complaint.    Personal Factors and Comorbidities  Age;Comorbidity 2    Comorbidities  left knee TKA 07/31/2019, partial R TKA 05/2018, HTN, Asthma    Examination-Activity Limitations  Bathing;Bed Mobility;Locomotion Level;Transfers;Stand;Stairs;Carry;Sit    Stability/Clinical Decision Making  Stable/Uncomplicated    Rehab Potential  Good    PT Frequency  3x / week    PT Duration  6 weeks    PT Treatment/Interventions  ADLs/Self Care Home Management;Cryotherapy;Electrical Stimulation;Moist Heat;Iontophoresis 41m/ml Dexamethasone;Gait training;Stair training;Functional mobility training;Therapeutic activities;Therapeutic exercise;Balance training;Neuromuscular re-education;Manual techniques;Passive range of motion;Patient/family education;Scar mobilization;Vasopneumatic Device;Taping    PT Next Visit Plan  cont with POC for ROM for ext and strength progression/ modalities PRN for pain/edema    PT Home Exercise Plan  continue HEP provided by HHPT, educated on importance of early mobility.       Patient will benefit from skilled therapeutic intervention in order to improve the following deficits and impairments:  Abnormal gait, Decreased activity tolerance, Decreased balance, Increased edema, Decreased strength, Decreased range of motion, Difficulty walking, Pain  Visit Diagnosis: Stiffness of left knee, not elsewhere classified  Acute pain of left knee  Muscle weakness (generalized)     Problem List Patient Active Problem List   Diagnosis Date Noted  . Encounter for well woman exam with routine gynecological exam 03/25/2018  . Screening for colorectal cancer 03/25/2018  . Fecal occult blood test positive 03/25/2018  . Current use of estrogen therapy 03/25/2018  . Orthostatic hypotension 05/03/2017  . Syncope due to orthostatic hypotension 05/03/2017  . Symptomatic cholelithiasis 05/02/2017  . Acute cholecystitis 05/02/2017   . Abnormal nuclear stress test   . Chest pain 04/27/2017  . Hyperglycemia 04/27/2017  . Anemia 04/27/2017  . Essential hypertension 07/15/2014  . Mild persistent asthma without complication [id number] . Vitamin D deficiency 07/15/2014    Valene Villa, CMaliMPT 09/17/2019, 11:44 AM  [facility] [address]Phone: [phone]  Fax:  [phone] Name: JZaneta Lightcap  MRN: [id number] Date of Birth: 1950/07/27

## 2019-09-19 ENCOUNTER — Encounter: Payer: Medicare Other | Admitting: Physical Therapy

## 2019-09-23 ENCOUNTER — Encounter: Payer: Self-pay | Admitting: *Deleted

## 2019-09-23 ENCOUNTER — Ambulatory Visit: Payer: Medicare Other | Admitting: *Deleted

## 2019-09-23 ENCOUNTER — Other Ambulatory Visit: Payer: Self-pay

## 2019-09-23 DIAGNOSIS — M25562 Pain in left knee: Secondary | ICD-10-CM

## 2019-09-23 DIAGNOSIS — M25662 Stiffness of left knee, not elsewhere classified: Secondary | ICD-10-CM | POA: Diagnosis not present

## 2019-09-23 DIAGNOSIS — M6281 Muscle weakness (generalized): Secondary | ICD-10-CM

## 2019-09-23 DIAGNOSIS — R600 Localized edema: Secondary | ICD-10-CM | POA: Diagnosis not present

## 2019-09-23 NOTE — Therapy (Signed)
[facility] [place], [place], [address] Phone: [phone]   Fax:  [phone]  Physical Therapy Treatment  Patient Details  Name: Traci Johnson MRN: [id number] Date of Birth: [birth year] Referring Provider (PT): Edmonia Lynch, MD   Encounter Date: 09/23/2019  PT End of Session - 09/23/19 1115    Visit Number  15    Date for PT Re-Evaluation  10/02/19    Authorization Type  FOTO 10th visit @ 43%, Progress note needed on 19th visit, KX modifier at 15th visit    PT Start Time  1030    PT Stop Time  1124    PT Time Calculation (min)  54 min       Past Medical History:  Diagnosis Date  . Allergy   . Asthma   . Hypertension   . Migraines    cluster    Past Surgical History:  Procedure Laterality Date  . ABDOMINAL HYSTERECTOMY  1985  . CHOLECYSTECTOMY N/A 05/02/2017   Procedure: LAPAROSCOPIC CHOLECYSTECTOMY;  Surgeon: Kinsinger, Arta Bruce, MD;  Location: [facility];  Service: General;  Laterality: N/A;  . LEFT HEART CATH AND CORONARY ANGIOGRAPHY N/A 04/30/2017   Procedure: LEFT HEART CATH AND CORONARY ANGIOGRAPHY;  Surgeon: Lorretta Harp, MD;  Location: [facility];  Service: Cardiovascular;  Laterality: N/A;  . LIGAMENT REPAIR Right    birth defect    There were no vitals filed for this visit.  Subjective Assessment - 09/23/19 1035    Subjective  COVID-19 screen performed prior to patient entering clinic.  LT Knee stiff and sore today    Pertinent History  left knee TKA 07/31/2019, partial R TKA 05/2018, HTN, Asthma    Limitations  Sitting;Standing;Walking;House hold activities    How long can you sit comfortably?  unable without pain; 30 mins 09/10/2019    How long can you walk comfortably?  household    Patient Stated Goals  decrease pain, return to recreational activities    Currently in Pain?  Yes    Pain Score  3     Pain Location  Knee    Pain Orientation  Left    Pain Descriptors / Indicators   Sore;Tightness    Pain Type  Surgical pain    Pain Onset  More than a month ago                       [facility] Adult PT Treatment/Exercise - 09/23/19 0001      Exercises   Exercises  Knee/Hip      Knee/Hip Exercises: Aerobic   Recumbent Bike  15 minutes moving to seat 1. level2      Knee/Hip Exercises: Standing   Lateral Step Up  Left;3 sets;10 reps;Hand Hold: 2;Step Height: 6"    Forward Step Up  Left;3 sets;10 reps;Hand Hold: 2;Step Height: 6"    Rocker Board  3 minutes    SLS  LT LE 4 sec max hold      Modalities   Modalities  Electrical Stimulation;Vasopneumatic      Electrical Stimulation   Electrical Stimulation Location  Left knee.    Electrical Stimulation Action  IFC    Electrical Stimulation Parameters  1-10 hz x 15 mins    Electrical Stimulation Goals  Edema;Pain      Vasopneumatic   Number Minutes Vasopneumatic   15 minutes    Vasopnuematic Location   --   Left knee.   Vasopneumatic Pressure  Medium    Vasopneumatic Temperature   34 for edema      Manual Therapy   Manual Therapy  Passive ROM    Passive ROM  manual PROM / Stretching to leftknee for flexion and ext with low load holds to improve mobility                  PT Long Term Goals - 09/15/19 1056      PT LONG TERM GOAL #1   Title  Patient will be independent with HEP and its progression    Time  6    Period  Weeks      PT LONG TERM GOAL #2   Title  Patient will demonstrate 115+ degrees of left knee flexion AROM to improve functional tasks.    Baseline  MET 09/15/19    Time  6    Period  Weeks    Status  Achieved   AROM 115 degrees     PT LONG TERM GOAL #3   Title  Patient will demonstrate 3 degrees or less of left knee extension AROM to improve gait mechanics.    Time  6    Period  Weeks    Status  On-going      PT LONG TERM GOAL #4   Title  Patient will demonstrate 4+/5 or greater left knee and hip strength to improve stability during functional tasks.    Time   6    Period  Weeks    Status  On-going      PT LONG TERM GOAL #5   Title  Patient will report ability to ambulate a community distance without assistive device and left knee pain less than or equal to 3/10.    Time  6    Period  Weeks    Status  On-going            Plan - 09/23/19 1308    Clinical Impression Statement  Pt arrived today doing fairly well and was able to perform all exs and act's for LT LE. She did well with step ups , but very challenged with SLS on LLE 3-4 sec max hold. PROM today 0-115 degrees. Normal modality response today    Personal Factors and Comorbidities  Age;Comorbidity 2    Comorbidities  left knee TKA 07/31/2019, partial R TKA 05/2018, HTN, Asthma    Examination-Activity Limitations  Bathing;Bed Mobility;Locomotion Level;Transfers;Stand;Stairs;Carry;Sit    Stability/Clinical Decision Making  Stable/Uncomplicated    Rehab Potential  Good    PT Frequency  3x / week    PT Duration  6 weeks    PT Treatment/Interventions  ADLs/Self Care Home Management;Cryotherapy;Electrical Stimulation;Moist Heat;Iontophoresis 69m/ml Dexamethasone;Gait training;Stair training;Functional mobility training;Therapeutic activities;Therapeutic exercise;Balance training;Neuromuscular re-education;Manual techniques;Passive range of motion;Patient/family education;Scar mobilization;Vasopneumatic Device;Taping    PT Next Visit Plan  cont with POC for ROM for ext and strength progression/ modalities PRN for pain/edema    PT Home Exercise Plan  continue HEP provided by HHPT, educated on importance of early mobility.    Consulted and Agree with Plan of Care  Patient       Patient will benefit from skilled therapeutic intervention in order to improve the following deficits and impairments:  Abnormal gait, Decreased activity tolerance, Decreased balance, Increased edema, Decreased strength, Decreased range of motion, Difficulty walking, Pain  Visit Diagnosis: Stiffness of left knee, not  elsewhere classified  Acute pain of left knee  Muscle weakness (generalized)  Localized edema     Problem  List Patient Active Problem List   Diagnosis Date Noted  . Encounter for well woman exam with routine gynecological exam 03/25/2018  . Screening for colorectal cancer 03/25/2018  . Fecal occult blood test positive 03/25/2018  . Current use of estrogen therapy 03/25/2018  . Orthostatic hypotension 05/03/2017  . Syncope due to orthostatic hypotension 05/03/2017  . Symptomatic cholelithiasis 05/02/2017  . Acute cholecystitis 05/02/2017  . Abnormal nuclear stress test   . Chest pain 04/27/2017  . Hyperglycemia 04/27/2017  . Anemia 04/27/2017  . Essential hypertension 07/15/2014  . Mild persistent asthma without complication 51/83/4373  . Vitamin D deficiency 07/15/2014    Josha Weekley,CHRIS, PTA 09/23/2019, 1:13 PM  [facility] [address] [place], [place], [address] Phone: [phone]   Fax:  [phone]  Name: Traci Johnson MRN: [id number] Date of Birth: Jan 31, 1951

## 2019-09-24 ENCOUNTER — Encounter: Payer: Self-pay | Admitting: Physical Therapy

## 2019-09-24 ENCOUNTER — Ambulatory Visit: Payer: Medicare Other | Admitting: Physical Therapy

## 2019-09-24 DIAGNOSIS — M25662 Stiffness of left knee, not elsewhere classified: Secondary | ICD-10-CM | POA: Diagnosis not present

## 2019-09-24 DIAGNOSIS — M6281 Muscle weakness (generalized): Secondary | ICD-10-CM

## 2019-09-24 DIAGNOSIS — R600 Localized edema: Secondary | ICD-10-CM | POA: Diagnosis not present

## 2019-09-24 DIAGNOSIS — M25562 Pain in left knee: Secondary | ICD-10-CM

## 2019-09-24 NOTE — Therapy (Signed)
[facility] [place], [place], [address] Phone: [phone]   Fax:  [phone]  Physical Therapy Treatment  Patient Details  Name: Traci Johnson MRN: [id number] Date of Birth: [birth year] Referring Provider (PT): Edmonia Lynch, MD   Encounter Date: 09/24/2019  PT End of Session - 09/24/19 1245    Visit Number  16    Number of Visits  18    Date for PT Re-Evaluation  10/02/19    Authorization Type  FOTO 10th visit @ 43%, Progress note needed on 19th visit, KX modifier at 15th visit    PT Start Time  1030    PT Stop Time  1120    PT Time Calculation (min)  50 min    Equipment Utilized During Treatment  Other (comment)    Activity Tolerance  Patient tolerated treatment well    Behavior During Therapy  [facility] for tasks assessed/performed       Past Medical History:  Diagnosis Date  . Allergy   . Asthma   . Hypertension   . Migraines    cluster    Past Surgical History:  Procedure Laterality Date  . ABDOMINAL HYSTERECTOMY  1985  . CHOLECYSTECTOMY N/A 05/02/2017   Procedure: LAPAROSCOPIC CHOLECYSTECTOMY;  Surgeon: Kinsinger, Arta Bruce, MD;  Location: [facility];  Service: General;  Laterality: N/A;  . LEFT HEART CATH AND CORONARY ANGIOGRAPHY N/A 04/30/2017   Procedure: LEFT HEART CATH AND CORONARY ANGIOGRAPHY;  Surgeon: Lorretta Harp, MD;  Location: [facility];  Service: Cardiovascular;  Laterality: N/A;  . LIGAMENT REPAIR Right    birth defect    There were no vitals filed for this visit.  Subjective Assessment - 09/24/19 1212    Subjective  COVID-19 screen performed prior to patient entering clinic.  Knee feels stiff.    Pertinent History  left knee TKA 07/31/2019, partial R TKA 05/2018, HTN, Asthma    Limitations  Sitting;Standing;Walking;House hold activities                       [facility] Adult PT Treatment/Exercise - 09/24/19 0001      Exercises   Exercises  Knee/Hip      Knee/Hip  Exercises: Machines for Strengthening   Cybex Knee Extension  10# x 3 minutes.    Cybex Knee Flexion  30# x 3 minutes.    Cybex Leg Press  2 plates x 3 minutes.      Modalities   Modalities  Health visitor Stimulation Location  Left knee.    Electrical Stimulation Action  IFC    Electrical Stimulation Parameters  1-10 Hz x 15 minutes.    Electrical Stimulation Goals  Edema;Pain      Vasopneumatic   Number Minutes Vasopneumatic   15 minutes    Vasopnuematic Location   --   Left knee.   Vasopneumatic Pressure  Medium                  PT Long Term Goals - 09/15/19 1056      PT LONG TERM GOAL #1   Title  Patient will be independent with HEP and its progression    Time  6    Period  Weeks      PT LONG TERM GOAL #2   Title  Patient will demonstrate 115+ degrees of left knee flexion AROM to improve functional tasks.    Baseline  MET 09/15/19    Time  6    Period  Weeks    Status  Achieved   AROM 115 degrees     PT LONG TERM GOAL #3   Title  Patient will demonstrate 3 degrees or less of left knee extension AROM to improve gait mechanics.    Time  6    Period  Weeks    Status  On-going      PT LONG TERM GOAL #4   Title  Patient will demonstrate 4+/5 or greater left knee and hip strength to improve stability during functional tasks.    Time  6    Period  Weeks    Status  On-going      PT LONG TERM GOAL #5   Title  Patient will report ability to ambulate a community distance without assistive device and left knee pain less than or equal to 3/10.    Time  6    Period  Weeks    Status  On-going            Plan - 09/24/19 1249    Clinical Impression Statement  Patient did very well with left knee flexion to 120 degrees.    Personal Factors and Comorbidities  Age;Comorbidity 2    Comorbidities  left knee TKA 07/31/2019, partial R TKA 05/2018, HTN, Asthma    Examination-Activity Limitations   Bathing;Bed Mobility;Locomotion Level;Transfers;Stand;Stairs;Carry;Sit    Stability/Clinical Decision Making  Stable/Uncomplicated    Rehab Potential  Good    PT Frequency  3x / week    PT Duration  6 weeks    PT Treatment/Interventions  ADLs/Self Care Home Management;Cryotherapy;Electrical Stimulation;Moist Heat;Iontophoresis 13m/ml Dexamethasone;Gait training;Stair training;Functional mobility training;Therapeutic activities;Therapeutic exercise;Balance training;Neuromuscular re-education;Manual techniques;Passive range of motion;Patient/family education;Scar mobilization;Vasopneumatic Device;Taping    PT Next Visit Plan  cont with POC for ROM for ext and strength progression/ modalities PRN for pain/edema    PT Home Exercise Plan  continue HEP provided by HHPT, educated on importance of early mobility.    Consulted and Agree with Plan of Care  Patient       Patient will benefit from skilled therapeutic intervention in order to improve the following deficits and impairments:  Abnormal gait, Decreased activity tolerance, Decreased balance, Increased edema, Decreased strength, Decreased range of motion, Difficulty walking, Pain  Visit Diagnosis: Stiffness of left knee, not elsewhere classified  Acute pain of left knee  Muscle weakness (generalized)  Localized edema     Problem List Patient Active Problem List   Diagnosis Date Noted  . Encounter for well woman exam with routine gynecological exam 03/25/2018  . Screening for colorectal cancer 03/25/2018  . Fecal occult blood test positive 03/25/2018  . Current use of estrogen therapy 03/25/2018  . Orthostatic hypotension 05/03/2017  . Syncope due to orthostatic hypotension 05/03/2017  . Symptomatic cholelithiasis 05/02/2017  . Acute cholecystitis 05/02/2017  . Abnormal nuclear stress test   . Chest pain 04/27/2017  . Hyperglycemia 04/27/2017  . Anemia 04/27/2017  . Essential hypertension 07/15/2014  . Mild persistent asthma  without complication 021/04/7355 . Vitamin D deficiency 07/15/2014    Traci Johnson, CMaliMPT 09/24/2019, 12:52 PM  [facility] [address]Phone: [phone]  Fax:  [phone] Name: Traci DemedeirosMRN: [id number] of Birth: [birth year]

## 2019-09-26 ENCOUNTER — Encounter: Payer: Medicare Other | Admitting: Physical Therapy

## 2019-09-30 ENCOUNTER — Other Ambulatory Visit: Payer: Self-pay

## 2019-09-30 ENCOUNTER — Ambulatory Visit: Payer: Medicare Other | Admitting: *Deleted

## 2019-09-30 DIAGNOSIS — R600 Localized edema: Secondary | ICD-10-CM | POA: Diagnosis not present

## 2019-09-30 DIAGNOSIS — M25562 Pain in left knee: Secondary | ICD-10-CM

## 2019-09-30 DIAGNOSIS — M25662 Stiffness of left knee, not elsewhere classified: Secondary | ICD-10-CM | POA: Diagnosis not present

## 2019-09-30 DIAGNOSIS — M6281 Muscle weakness (generalized): Secondary | ICD-10-CM

## 2019-09-30 NOTE — Therapy (Signed)
[facility] [place], [place], [address] Phone: [phone]   Fax:  [phone]  Physical Therapy Treatment  Patient Details  Name: Traci Johnson MRN: [id number] Date of Birth: 1951-01-04 Referring Provider (PT): Edmonia Lynch, MD   Encounter Date: 09/30/2019  PT End of Session - 09/30/19 1318    Visit Number  17    Number of Visits  18    Date for PT Re-Evaluation  10/02/19    Authorization Type  FOTO 10th visit @ 43%, Progress note needed on 19th visit, KX modifier at 15th visit    PT Start Time  1030    PT Stop Time  1125    PT Time Calculation (min)  55 min       Past Medical History:  Diagnosis Date  . Allergy   . Asthma   . Hypertension   . Migraines    cluster    Past Surgical History:  Procedure Laterality Date  . ABDOMINAL HYSTERECTOMY  1985  . CHOLECYSTECTOMY N/A 05/02/2017   Procedure: LAPAROSCOPIC CHOLECYSTECTOMY;  Surgeon: Kinsinger, Arta Bruce, MD;  Location: [facility];  Service: General;  Laterality: N/A;  . LEFT HEART CATH AND CORONARY ANGIOGRAPHY N/A 04/30/2017   Procedure: LEFT HEART CATH AND CORONARY ANGIOGRAPHY;  Surgeon: Lorretta Harp, MD;  Location: [facility];  Service: Cardiovascular;  Laterality: N/A;  . LIGAMENT REPAIR Right    birth defect    There were no vitals filed for this visit.  Subjective Assessment - 09/30/19 1328    Subjective  COVID-19 screen performed prior to patient entering clinic.  Knee feels tight today    Pertinent History  left knee TKA 07/31/2019, partial R TKA 05/2018, HTN, Asthma    Limitations  Sitting;Standing;Walking;House hold activities    How long can you sit comfortably?  unable without pain; 30 mins 09/10/2019    How long can you stand comfortably?  short periods    How long can you walk comfortably?  household    Patient Stated Goals  decrease pain, return to recreational activities    Currently in Pain?  Yes    Pain Score  1     Pain  Location  Knee    Pain Orientation  Left    Pain Descriptors / Indicators  Sore;Tightness    Pain Type  Surgical pain    Pain Onset  More than a month ago                       [facility] Adult PT Treatment/Exercise - 09/30/19 0001      Knee/Hip Exercises: Aerobic   Recumbent Bike  15 minutes moving to seat 1. level2      Knee/Hip Exercises: Machines for Strengthening   Cybex Knee Extension  10# x 3 minutes.    Cybex Knee Flexion  30# x 3 minutes.    Cybex Leg Press  2 plates x 3 minutes.      Modalities   Modalities  Health visitor Stimulation Location  Left knee.    Electrical Stimulation Action  IFC    Electrical Stimulation Parameters  1-10 HZ  x15 mins    Electrical Stimulation Goals  Edema;Pain      Vasopneumatic   Number Minutes Vasopneumatic   15 minutes    Vasopnuematic Location   Knee    Vasopneumatic Pressure  Medium    Vasopneumatic Temperature  34 for edema       PROM x 5 mins flexion to 120 degrees           PT Long Term Goals - 09/15/19 1056      PT LONG TERM GOAL #1   Title  Patient will be independent with HEP and its progression    Time  6    Period  Weeks      PT LONG TERM GOAL #2   Title  Patient will demonstrate 115+ degrees of left knee flexion AROM to improve functional tasks.    Baseline  MET 09/15/19    Time  6    Period  Weeks    Status  Achieved   AROM 115 degrees     PT LONG TERM GOAL #3   Title  Patient will demonstrate 3 degrees or less of left knee extension AROM to improve gait mechanics.    Time  6    Period  Weeks    Status  On-going      PT LONG TERM GOAL #4   Title  Patient will demonstrate 4+/5 or greater left knee and hip strength to improve stability during functional tasks.    Time  6    Period  Weeks    Status  On-going      PT LONG TERM GOAL #5   Title  Patient will report ability to ambulate a community distance without assistive  device and left knee pain less than or equal to 3/10.    Time  6    Period  Weeks    Status  On-going            Plan - 09/30/19 1320    Clinical Impression Statement  Pt arrived today with mainly tightness in LT knee. She did great with Rx today and was able to meet PROM to 120 degrees. Mainly fatigue end of session. Normal modality response.    Personal Factors and Comorbidities  Age;Comorbidity 2    Comorbidities  left knee TKA 07/31/2019, partial R TKA 05/2018, HTN, Asthma    Examination-Activity Limitations  Bathing;Bed Mobility;Locomotion Level;Transfers;Stand;Stairs;Carry;Sit    Rehab Potential  Good    PT Frequency  3x / week    PT Duration  6 weeks    PT Treatment/Interventions  ADLs/Self Care Home Management;Cryotherapy;Electrical Stimulation;Moist Heat;Iontophoresis '4mg'$ /ml Dexamethasone;Gait training;Stair training;Functional mobility training;Therapeutic activities;Therapeutic exercise;Balance training;Neuromuscular re-education;Manual techniques;Passive range of motion;Patient/family education;Scar mobilization;Vasopneumatic Device;Taping    PT Next Visit Plan  Possible DC    PT Home Exercise Plan  continue HEP provided by HHPT, educated on importance of early mobility.    Consulted and Agree with Plan of Care  Patient       Patient will benefit from skilled therapeutic intervention in order to improve the following deficits and impairments:  Abnormal gait, Decreased activity tolerance, Decreased balance, Increased edema, Decreased strength, Decreased range of motion, Difficulty walking, Pain  Visit Diagnosis: Stiffness of left knee, not elsewhere classified  Acute pain of left knee  Muscle weakness (generalized)  Localized edema     Problem List Patient Active Problem List   Diagnosis Date Noted  . Encounter for well woman exam with routine gynecological exam 03/25/2018  . Screening for colorectal cancer 03/25/2018  . Fecal occult blood test positive  03/25/2018  . Current use of estrogen therapy 03/25/2018  . Orthostatic hypotension 05/03/2017  . Syncope due to orthostatic hypotension 05/03/2017  . Symptomatic cholelithiasis 05/02/2017  . Acute cholecystitis 05/02/2017  .  Abnormal nuclear stress test   . Chest pain 04/27/2017  . Hyperglycemia 04/27/2017  . Anemia 04/27/2017  . Essential hypertension 07/15/2014  . Mild persistent asthma without complication 48/14/4392  . Vitamin D deficiency 07/15/2014    Verlin Duke,CHRIS, PTA 09/30/2019, 1:31 PM  [facility] [address] [place], [place], [address] Phone: [phone]   Fax:  [phone]  Name: Traci Johnson MRN: [id number] Date of Birth: 10-14-1950

## 2019-10-02 ENCOUNTER — Other Ambulatory Visit: Payer: Self-pay

## 2019-10-02 ENCOUNTER — Ambulatory Visit: Payer: Medicare Other | Admitting: Physical Therapy

## 2019-10-02 ENCOUNTER — Encounter: Payer: Self-pay | Admitting: Physical Therapy

## 2019-10-02 DIAGNOSIS — R600 Localized edema: Secondary | ICD-10-CM

## 2019-10-02 DIAGNOSIS — M6281 Muscle weakness (generalized): Secondary | ICD-10-CM

## 2019-10-02 DIAGNOSIS — M25662 Stiffness of left knee, not elsewhere classified: Secondary | ICD-10-CM | POA: Diagnosis not present

## 2019-10-02 DIAGNOSIS — M25562 Pain in left knee: Secondary | ICD-10-CM

## 2019-10-02 NOTE — Therapy (Signed)
[facility] [place], [place], [address] Phone: [phone]   Fax:  [phone]  Physical Therapy Treatment  Patient Details  Name: Traci Johnson MRN: [id number] Date of Birth: [birth year] Referring Provider (PT): Edmonia Lynch, MD   Encounter Date: 10/02/2019  PT End of Session - 10/02/19 1041    Visit Number  18    Number of Visits  18    Date for PT Re-Evaluation  10/02/19    Authorization Type  FOTO 18th visit @ 30%, Progress note needed on 19th visit, KX modifier at 15th visit    PT Start Time  1031    PT Stop Time  1113    PT Time Calculation (min)  42 min    Activity Tolerance  Patient tolerated treatment well    Behavior During Therapy  [facility] for tasks assessed/performed       Past Medical History:  Diagnosis Date  . Allergy   . Asthma   . Hypertension   . Migraines    cluster    Past Surgical History:  Procedure Laterality Date  . ABDOMINAL HYSTERECTOMY  1985  . CHOLECYSTECTOMY N/A 05/02/2017   Procedure: LAPAROSCOPIC CHOLECYSTECTOMY;  Surgeon: Kinsinger, Arta Bruce, MD;  Location: [facility];  Service: General;  Laterality: N/A;  . LEFT HEART CATH AND CORONARY ANGIOGRAPHY N/A 04/30/2017   Procedure: LEFT HEART CATH AND CORONARY ANGIOGRAPHY;  Surgeon: Lorretta Harp, MD;  Location: [facility];  Service: Cardiovascular;  Laterality: N/A;  . LIGAMENT REPAIR Right    birth defect    There were no vitals filed for this visit.  Subjective Assessment - 10/02/19 1031    Subjective  COVID-19 screen performed prior to patient entering clinic.  Patient arrived with no new complaints today    Pertinent History  left knee TKA 07/31/2019, partial R TKA 05/2018, HTN, Asthma    Limitations  Sitting;Standing;Walking;House hold activities    How long can you sit comfortably?  unable without pain; 30 mins 09/10/2019    How long can you stand comfortably?  short periods    How long can you walk comfortably?   household    Patient Stated Goals  decrease pain, return to recreational activities    Currently in Pain?  No/denies         [facility] PT Assessment - 10/02/19 0001      ROM / Strength   AROM / PROM / Strength  AROM;PROM;Strength      AROM   AROM Assessment Site  Knee    Right/Left Knee  Left    Left Knee Extension  -3    Left Knee Flexion  116      PROM   PROM Assessment Site  Knee    Right/Left Knee  Left    Left Knee Extension  -1    Left Knee Flexion  120      Strength   Strength Assessment Site  Knee;Hip    Right/Left Hip  Left    Left Hip Flexion  4+/5    Left Hip ABduction  4+/5    Right/Left Knee  Left    Left Knee Flexion  4+/5    Left Knee Extension  5/5                   [facility] Adult PT Treatment/Exercise - 10/02/19 0001      Knee/Hip Exercises: Aerobic   Recumbent Bike  15 minutes moving to seat 1. level2  Knee/Hip Exercises: Machines for Strengthening   Cybex Knee Extension  10# x 3 minutes.    Cybex Knee Flexion  30# x 3 minutes.    Cybex Leg Press  2 plates x 3 minutes.      Acupuncturist Location  Left knee.    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[employer]    Electrical Stimulation Parameters  1-10hz  x20mn    Electrical Stimulation Goals  Edema   per request     Vasopneumatic   Number Minutes Vasopneumatic   15 minutes    Vasopnuematic Location   Knee    Vasopneumatic Pressure  Medium    Vasopneumatic Temperature   34 for edema             PT Education - 10/02/19 1045    Education Details  HEP progression    Person(s) Educated  Patient    Methods  Explanation;Demonstration;Handout    Comprehension  Verbalized understanding;Returned demonstration          PT Long Term Goals - 10/02/19 1043      PT LONG TERM GOAL #1   Title  Patient will be independent with HEP and its progression    Baseline  Met 10/02/19    Time  6    Period  Weeks    Status  Achieved      PT LONG TERM GOAL #2    Title  Patient will demonstrate 115+ degrees of left knee flexion AROM to improve functional tasks.    Baseline  MET 09/15/19    Time  6    Period  Weeks    Status  Achieved      PT LONG TERM GOAL #3   Title  Patient will demonstrate 3 degrees or less of left knee extension AROM to improve gait mechanics.    Baseline  Met 10/02/19 AROM -3 degrees    Time  6    Period  Weeks    Status  Achieved      PT LONG TERM GOAL #4   Title  Patient will demonstrate 4+/5 or greater left knee and hip strength to improve stability during functional tasks.    Baseline  Met 10/02/19    Time  6    Period  Weeks    Status  Achieved      PT LONG TERM GOAL #5   Title  Patient will report ability to ambulate a community distance without assistive device and left knee pain less than or equal to 3/10.    Baseline  Met 10/02/19    Time  6    Period  Weeks    Status  Achieved            Plan - 10/02/19 1109    Clinical Impression Statement  Patient has met all current goals and is ready to DC to HEP per PT recommendation    Personal Factors and Comorbidities  Age;Comorbidity 2    Comorbidities  left knee TKA 07/31/2019, partial R TKA 05/2018, HTN, Asthma    Examination-Activity Limitations  Bathing;Bed Mobility;Locomotion Level;Transfers;Stand;Stairs;Carry;Sit    Stability/Clinical Decision Making  Stable/Uncomplicated    Rehab Potential  Good    PT Frequency  3x / week    PT Duration  6 weeks    PT Treatment/Interventions  ADLs/Self Care Home Management;Cryotherapy;Electrical Stimulation;Moist Heat;Iontophoresis 479mml Dexamethasone;Gait training;Stair training;Functional mobility training;Therapeutic activities;Therapeutic exercise;Balance training;Neuromuscular re-education;Manual techniques;Passive range of motion;Patient/family education;Scar mobilization;Vasopneumatic Device;Taping    PT Next Visit Plan  DC    Consulted and Agree with Plan of Care  Patient       Patient will benefit from  skilled therapeutic intervention in order to improve the following deficits and impairments:  Abnormal gait, Decreased activity tolerance, Decreased balance, Increased edema, Decreased strength, Decreased range of motion, Difficulty walking, Pain  Visit Diagnosis: Stiffness of left knee, not elsewhere classified  Acute pain of left knee  Muscle weakness (generalized)  Localized edema     Problem List Patient Active Problem List   Diagnosis Date Noted  . Encounter for well woman exam with routine gynecological exam 03/25/2018  . Screening for colorectal cancer 03/25/2018  . Fecal occult blood test positive 03/25/2018  . Current use of estrogen therapy 03/25/2018  . Orthostatic hypotension 05/03/2017  . Syncope due to orthostatic hypotension 05/03/2017  . Symptomatic cholelithiasis 05/02/2017  . Acute cholecystitis 05/02/2017  . Abnormal nuclear stress test   . Chest pain 04/27/2017  . Hyperglycemia 04/27/2017  . Anemia 04/27/2017  . Essential hypertension 07/15/2014  . Mild persistent asthma without complication 17/83/7542  . Vitamin D deficiency 07/15/2014    Ladean Raya, PTA 10/02/19 11:13 AM  [facility] [address] [place], [place], [address] Phone: [phone]   Fax:  [phone]  Name: Traci Johnson MRN: [id number] Date of Birth: [birth year]  PHYSICAL THERAPY DISCHARGE SUMMARY  Visits from Start of Care: 18.  Current functional level related to goals / functional outcomes: See above.   Remaining deficits: All goals met.   Education / Equipment: HEP. Plan: Patient agrees to discharge.  Patient goals were met. Patient is being discharged due to meeting the stated rehab goals.  ?????         Mali Applegate MPT

## 2019-10-02 NOTE — Patient Instructions (Signed)
Strengthening: Hip Abduction (Side-Lying)  Strengthening: Straight Leg Raise (Phase 1)  Repeat _10___ times per set. Do __2__ sets per session. Do __2__ sessions per day.   Bridging   Slowly raise buttocks from floor, keeping stomach tight. Repeat _10___ times per set. Do __2__ sets per session. Do __2__ sessions per day.   Straight Leg Raise   Tighten stomach and slowly raise locked right leg __4__ inches from floor. Repeat __10-30__ times per set. Do __2__ sets per session. Do __2__ sessions per day.

## 2019-10-09 ENCOUNTER — Ambulatory Visit (HOSPITAL_COMMUNITY)
Admission: RE | Admit: 2019-10-09 | Discharge: 2019-10-09 | Disposition: A | Discharge status: Home / Self Care | Payer: Medicare Other | Source: Ambulatory Visit | Attending: Internal Medicine | Admitting: Internal Medicine

## 2019-10-09 ENCOUNTER — Other Ambulatory Visit: Payer: Self-pay

## 2019-10-09 DIAGNOSIS — Z1231 Encounter for screening mammogram for malignant neoplasm of breast: Secondary | ICD-10-CM | POA: Diagnosis not present

## 2019-10-15 DIAGNOSIS — M545 Low back pain: Secondary | ICD-10-CM | POA: Diagnosis not present

## 2019-10-17 DIAGNOSIS — F5101 Primary insomnia: Secondary | ICD-10-CM | POA: Diagnosis not present

## 2019-10-17 DIAGNOSIS — R7301 Impaired fasting glucose: Secondary | ICD-10-CM | POA: Diagnosis not present

## 2019-10-17 DIAGNOSIS — R7303 Prediabetes: Secondary | ICD-10-CM | POA: Diagnosis not present

## 2019-10-17 DIAGNOSIS — E782 Mixed hyperlipidemia: Secondary | ICD-10-CM | POA: Diagnosis not present

## 2019-10-20 DIAGNOSIS — R7303 Prediabetes: Secondary | ICD-10-CM | POA: Diagnosis not present

## 2019-10-20 DIAGNOSIS — E782 Mixed hyperlipidemia: Secondary | ICD-10-CM | POA: Diagnosis not present

## 2019-10-20 DIAGNOSIS — J302 Other seasonal allergic rhinitis: Secondary | ICD-10-CM | POA: Diagnosis not present

## 2019-10-20 DIAGNOSIS — N951 Menopausal and female climacteric states: Secondary | ICD-10-CM | POA: Diagnosis not present

## 2019-10-20 DIAGNOSIS — I10 Essential (primary) hypertension: Secondary | ICD-10-CM | POA: Diagnosis not present

## 2019-12-08 DIAGNOSIS — S76101A Unspecified injury of right quadriceps muscle, fascia and tendon, initial encounter: Secondary | ICD-10-CM | POA: Diagnosis not present

## 2019-12-16 DIAGNOSIS — M5441 Lumbago with sciatica, right side: Secondary | ICD-10-CM | POA: Diagnosis not present

## 2019-12-23 DIAGNOSIS — M47815 Spondylosis without myelopathy or radiculopathy, thoracolumbar region: Secondary | ICD-10-CM | POA: Diagnosis not present

## 2019-12-23 DIAGNOSIS — M47816 Spondylosis without myelopathy or radiculopathy, lumbar region: Secondary | ICD-10-CM | POA: Diagnosis not present

## 2019-12-23 DIAGNOSIS — M47817 Spondylosis without myelopathy or radiculopathy, lumbosacral region: Secondary | ICD-10-CM | POA: Diagnosis not present

## 2019-12-30 DIAGNOSIS — M25551 Pain in right hip: Secondary | ICD-10-CM | POA: Diagnosis not present

## 2019-12-31 DIAGNOSIS — M25551 Pain in right hip: Secondary | ICD-10-CM | POA: Diagnosis not present

## 2020-02-05 DIAGNOSIS — I10 Essential (primary) hypertension: Secondary | ICD-10-CM | POA: Diagnosis not present

## 2020-02-05 DIAGNOSIS — R072 Precordial pain: Secondary | ICD-10-CM | POA: Diagnosis not present

## 2020-02-21 DIAGNOSIS — H04123 Dry eye syndrome of bilateral lacrimal glands: Secondary | ICD-10-CM | POA: Diagnosis not present

## 2020-02-21 DIAGNOSIS — H40033 Anatomical narrow angle, bilateral: Secondary | ICD-10-CM | POA: Diagnosis not present

## 2020-03-16 DIAGNOSIS — E782 Mixed hyperlipidemia: Secondary | ICD-10-CM | POA: Diagnosis not present

## 2020-03-16 DIAGNOSIS — I10 Essential (primary) hypertension: Secondary | ICD-10-CM | POA: Diagnosis not present

## 2020-03-16 DIAGNOSIS — J449 Chronic obstructive pulmonary disease, unspecified: Secondary | ICD-10-CM | POA: Diagnosis not present

## 2020-03-16 DIAGNOSIS — Z720 Tobacco use: Secondary | ICD-10-CM | POA: Diagnosis not present

## 2020-03-16 DIAGNOSIS — J069 Acute upper respiratory infection, unspecified: Secondary | ICD-10-CM | POA: Diagnosis not present

## 2020-03-16 DIAGNOSIS — R7301 Impaired fasting glucose: Secondary | ICD-10-CM | POA: Diagnosis not present

## 2020-04-01 DIAGNOSIS — M25551 Pain in right hip: Secondary | ICD-10-CM | POA: Diagnosis not present

## 2020-04-21 DIAGNOSIS — E782 Mixed hyperlipidemia: Secondary | ICD-10-CM | POA: Diagnosis not present

## 2020-04-21 DIAGNOSIS — R7301 Impaired fasting glucose: Secondary | ICD-10-CM | POA: Diagnosis not present

## 2020-04-21 DIAGNOSIS — Z720 Tobacco use: Secondary | ICD-10-CM | POA: Diagnosis not present

## 2020-04-26 DIAGNOSIS — R7303 Prediabetes: Secondary | ICD-10-CM | POA: Diagnosis not present

## 2020-04-26 DIAGNOSIS — Z23 Encounter for immunization: Secondary | ICD-10-CM | POA: Diagnosis not present

## 2020-04-26 DIAGNOSIS — Z0001 Encounter for general adult medical examination with abnormal findings: Secondary | ICD-10-CM | POA: Diagnosis not present

## 2020-05-13 ENCOUNTER — Other Ambulatory Visit: Payer: Self-pay

## 2020-05-13 ENCOUNTER — Ambulatory Visit (INDEPENDENT_AMBULATORY_CARE_PROVIDER_SITE_OTHER): Payer: Medicare Other | Admitting: Adult Health

## 2020-05-13 ENCOUNTER — Encounter: Payer: Self-pay | Admitting: Adult Health

## 2020-05-13 VITALS — BP 152/84 | HR 93 | Ht 64.0 in | Wt 212.0 lb

## 2020-05-13 DIAGNOSIS — Z01419 Encounter for gynecological examination (general) (routine) without abnormal findings: Secondary | ICD-10-CM | POA: Diagnosis not present

## 2020-05-13 DIAGNOSIS — Z79899 Other long term (current) drug therapy: Secondary | ICD-10-CM | POA: Diagnosis not present

## 2020-05-13 DIAGNOSIS — Z1211 Encounter for screening for malignant neoplasm of colon: Secondary | ICD-10-CM | POA: Diagnosis not present

## 2020-05-13 DIAGNOSIS — Z79818 Long term (current) use of other agents affecting estrogen receptors and estrogen levels: Secondary | ICD-10-CM

## 2020-05-13 DIAGNOSIS — N8189 Other female genital prolapse: Secondary | ICD-10-CM

## 2020-05-13 LAB — HEMOCCULT GUIAC POC 1CARD (OFFICE): Fecal Occult Blood, POC: NEGATIVE

## 2020-05-13 NOTE — Progress Notes (Signed)
Patient ID: Traci Johnson, female   DOB: [birth year], 69 y.o.   MRN: [id number] History of Present Illness: Traci Johnson is a 68 year old white female,maried, sp hysterectomy in for a well woman gyn exam and follow up on estrace 1 mg. She has body aches, knees and now right hip and back, getting MRI next week.She has had 2 Covid vaccines. PCP is Dr Nevada Crane.  Current Medications, Allergies, Past Medical History, Past Surgical History, Family History and Social History were reviewed in [employer] record.     Review of Systems: Patient denies any headaches, hearing loss, fatigue, blurred vision, shortness of breath, chest pain, abdominal pain, problems with bowel movements, urination, or intercourse(not often). No mood swings. See HPI for positives.    Physical Exam:BP (!) 152/84 (BP Location: Left Arm, Patient Position: Sitting, Cuff Size: Normal)   Pulse 93   Ht 5\' 4"  (1.626 m)   Wt 212 lb (96.2 kg)   BMI 36.39 kg/m  General:  Well developed, well nourished, no acute distress Skin:  Warm and dry,has AKs and moles  Neck:  Midline trachea, normal thyroid, good ROM, no lymphadenopathy,no carotid bruits heard Lungs; Clear to auscultation bilaterally Breast:  No dominant palpable mass, retraction, or nipple discharge Cardiovascular: Regular rate and rhythm Abdomen:  Soft, non tender, no hepatosplenomegaly Pelvic:  External genitalia is normal in appearance, has multiple angiokeratomas.  The vagina is pale with loss of moisture and rugae, has pelvic relaxation. Urethra has no lesions or masses. The cervix and uterus are absent. No adnexal masses or tenderness noted.Bladder is non tender, no masses felt. Rectal: Good sphincter tone, no polyps, or hemorrhoids felt.  Hemoccult negative. Extremities/musculoskeletal:  No swelling or varicosities noted, no clubbing or cyanosis Psych:  No mood changes, alert and cooperative,seems happy AA is 1 Fall risk is low PHQ 9 score is 13, no  SI, not depressed just has body aches    Upstream - 05/13/20 1055      Pregnancy Intention Screening   Does the patient want to become pregnant in the next year? N/A    Does the patient's partner want to become pregnant in the next year? N/A    Would the patient like to discuss contraceptive options today? N/A      Contraception Wrap Up   Current Method --   hyst   End Method --   hyst   Contraception Counseling Provided No         Examination chaperoned by Estill Bamberg LPN  Impression and Plan; 1. Encounter for well woman exam with routine gynecological exam Physical in 1 year  Labs with PCP Mammogram yearly Colonoscopy per GI   2. Current use of estrogen therapy Continue estrace 1 mg, has refills she says   3. Encounter for screening fecal occult blood testing  4. Pelvic relaxation

## 2020-05-17 DIAGNOSIS — M25451 Effusion, right hip: Secondary | ICD-10-CM | POA: Diagnosis not present

## 2020-05-17 DIAGNOSIS — M1611 Unilateral primary osteoarthritis, right hip: Secondary | ICD-10-CM | POA: Diagnosis not present

## 2020-05-17 DIAGNOSIS — M7061 Trochanteric bursitis, right hip: Secondary | ICD-10-CM | POA: Diagnosis not present

## 2020-05-21 DIAGNOSIS — M25551 Pain in right hip: Secondary | ICD-10-CM | POA: Diagnosis not present

## 2020-06-02 DIAGNOSIS — Z01812 Encounter for preprocedural laboratory examination: Secondary | ICD-10-CM | POA: Diagnosis not present

## 2020-06-02 DIAGNOSIS — M1611 Unilateral primary osteoarthritis, right hip: Secondary | ICD-10-CM | POA: Diagnosis not present

## 2020-06-02 DIAGNOSIS — M25551 Pain in right hip: Secondary | ICD-10-CM | POA: Diagnosis not present

## 2020-06-02 DIAGNOSIS — R791 Abnormal coagulation profile: Secondary | ICD-10-CM | POA: Diagnosis not present

## 2020-06-06 DIAGNOSIS — J4521 Mild intermittent asthma with (acute) exacerbation: Secondary | ICD-10-CM | POA: Diagnosis not present

## 2020-06-15 DIAGNOSIS — J069 Acute upper respiratory infection, unspecified: Secondary | ICD-10-CM | POA: Diagnosis not present

## 2020-06-15 DIAGNOSIS — J029 Acute pharyngitis, unspecified: Secondary | ICD-10-CM | POA: Diagnosis not present

## 2020-06-15 DIAGNOSIS — R0989 Other specified symptoms and signs involving the circulatory and respiratory systems: Secondary | ICD-10-CM | POA: Diagnosis not present

## 2020-06-21 ENCOUNTER — Ambulatory Visit: Payer: Medicare Other | Admitting: Physical Therapy

## 2020-06-30 ENCOUNTER — Other Ambulatory Visit: Payer: Medicare Other

## 2020-06-30 DIAGNOSIS — Z20822 Contact with and (suspected) exposure to covid-19: Secondary | ICD-10-CM

## 2020-07-03 DIAGNOSIS — E782 Mixed hyperlipidemia: Secondary | ICD-10-CM | POA: Diagnosis not present

## 2020-07-03 DIAGNOSIS — Z0001 Encounter for general adult medical examination with abnormal findings: Secondary | ICD-10-CM | POA: Diagnosis not present

## 2020-07-03 DIAGNOSIS — R6883 Chills (without fever): Secondary | ICD-10-CM | POA: Diagnosis not present

## 2020-07-03 DIAGNOSIS — J302 Other seasonal allergic rhinitis: Secondary | ICD-10-CM | POA: Diagnosis not present

## 2020-07-03 DIAGNOSIS — I10 Essential (primary) hypertension: Secondary | ICD-10-CM | POA: Diagnosis not present

## 2020-07-03 DIAGNOSIS — Z720 Tobacco use: Secondary | ICD-10-CM | POA: Diagnosis not present

## 2020-07-03 DIAGNOSIS — F5101 Primary insomnia: Secondary | ICD-10-CM | POA: Diagnosis not present

## 2020-07-03 DIAGNOSIS — J449 Chronic obstructive pulmonary disease, unspecified: Secondary | ICD-10-CM | POA: Diagnosis not present

## 2020-07-03 DIAGNOSIS — R7301 Impaired fasting glucose: Secondary | ICD-10-CM | POA: Diagnosis not present

## 2020-07-03 LAB — SARS-COV-2, NAA 2 DAY TAT

## 2020-07-03 LAB — NOVEL CORONAVIRUS, NAA: SARS-CoV-2, NAA: NOT DETECTED

## 2020-07-08 DIAGNOSIS — M1611 Unilateral primary osteoarthritis, right hip: Secondary | ICD-10-CM | POA: Diagnosis not present

## 2020-07-12 ENCOUNTER — Ambulatory Visit: Payer: Medicare Other | Admitting: Physical Therapy

## 2020-07-12 DIAGNOSIS — M1611 Unilateral primary osteoarthritis, right hip: Secondary | ICD-10-CM | POA: Diagnosis not present

## 2020-07-13 ENCOUNTER — Other Ambulatory Visit: Payer: Self-pay

## 2020-07-13 ENCOUNTER — Ambulatory Visit: Payer: Medicare Other | Attending: Orthopedic Surgery | Admitting: Physical Therapy

## 2020-07-13 DIAGNOSIS — M25551 Pain in right hip: Secondary | ICD-10-CM

## 2020-07-13 DIAGNOSIS — M6281 Muscle weakness (generalized): Secondary | ICD-10-CM | POA: Diagnosis not present

## 2020-07-13 NOTE — Therapy (Signed)
[facility] [place], [place], [address] Phone: [phone]   Fax:  [phone]  Physical Therapy Evaluation  Patient Details  Name: Traci Johnson MRN: [id number] Date of Birth: 04-22-1951 Referring Provider (PT): Edmonia Lynch MD.   Encounter Date: 07/13/2020   PT End of Session - 07/13/20 1401    Visit Number 1    Number of Visits 16    Date for PT Re-Evaluation 10/11/20    Authorization Type FOTO AT LEAST EVERY 5TH VISIT.  PROGRESS NOTE AT 10TH VISIT.  KX MODIFIER AFTER 15 VISITS.    PT Start Time 0145    PT Stop Time 0220    PT Time Calculation (min) 35 min    Activity Tolerance Patient tolerated treatment well    Behavior During Therapy WFL for tasks assessed/performed           Past Medical History:  Diagnosis Date   Allergy    Asthma    Hypertension    Migraines    cluster    Past Surgical History:  Procedure Laterality Date   ABDOMINAL HYSTERECTOMY  1985   CHOLECYSTECTOMY N/A 05/02/2017   Procedure: LAPAROSCOPIC CHOLECYSTECTOMY;  Surgeon: Mickeal Skinner, MD;  Location: [facility];  Service: General;  Laterality: N/A;   LEFT HEART CATH AND CORONARY ANGIOGRAPHY N/A 04/30/2017   Procedure: LEFT HEART CATH AND CORONARY ANGIOGRAPHY;  Surgeon: Lorretta Harp, MD;  Location: [facility];  Service: Cardiovascular;  Laterality: N/A;   LIGAMENT REPAIR Right    birth defect   REPLACEMENT TOTAL KNEE BILATERAL      There were no vitals filed for this visit.    Subjective Assessment - 07/13/20 1401    Subjective COVID-19 screen performed prior to patient entering clinic.  The patient underwent a right anterior total hip replacemetn on 07/08/20.  She presented to the clinic today witha FWW.  Her pain-level is rated at an 8/10.  Resting in bed decreases her pain.  Walking increases her pain.    Pertinent History Bilateral TKA's, HTN.    How long can you walk comfortably? Short distances in home  with a FWW.    Patient Stated Goals Get more active.  Regain strength.    Currently in Pain? Yes    Pain Score 8     Pain Location Hip    Pain Orientation Right    Pain Descriptors / Indicators Aching;Throbbing    Pain Type Surgical pain    Pain Onset In the past 7 days    Pain Frequency Constant    Aggravating Factors  See above.    Pain Relieving Factors See above.              [facility] PT Assessment - 07/13/20 0001      Assessment   Medical Diagnosis S/p right anterior total hip replacement.    Referring Provider (PT) Edmonia Lynch MD.    Onset Date/Surgical Date 07/08/20      Precautions   Precaution Comments Anterior THA.  No ultrasound.      Restrictions   Weight Bearing Restrictions No      Balance Screen   Has the patient fallen in the past 6 months No    Has the patient had a decrease in activity level because of a fear of falling?  Yes    Is the patient reluctant to leave their home because of a fear of falling?  Yes      Home Environment  Living Environment Private residence      Prior Function   Level of Independence Independent      Observation/Other Assessments   Focus on Therapeutic Outcomes (FOTO)  Complete.      Observation/Other Assessments-Edema    Edema --   Around right lateral hip.     ROM / Strength   AROM / PROM / Strength AROM;Strength      AROM   Overall AROM Comments In supine:  AA right hip flxion to 80 degrees.      Strength   Overall Strength Comments Right hip flexion and abduction= 3-/5, right knee extension 3+/5.      Palpation   Palpation comment Aquacel inatct over right hip.  Hip currently feels numb.      Transfers   Comments Sit to stand with armrests.  Sit to supine to sit with assist to patient's right LE.      Ambulation/Gait   Gait Comments Antalgic gait pattern wiht FWW with decrease stance time over right LE and decrease step and stride length.                      Objective measurements  completed on examination: See above findings.       [facility] Adult PT Treatment/Exercise - 07/13/20 0001      Modalities   Modalities Cryotherapy      Cryotherapy   Number Minutes Cryotherapy 10 Minutes    Cryotherapy Location --   Right hip while seated.                      PT Long Term Goals - 07/13/20 1417      PT LONG TERM GOAL #1   Title Patient will be independent with HEP and its progression    Time 8    Period Weeks    Status New      PT LONG TERM GOAL #2   Title Walk in clinic 500 feet with straight cane.    Time 8    Period Weeks    Status New      PT LONG TERM GOAL #3   Title Perform a reciprocating stair gait with one railing.    Time 8    Period Weeks    Status New      PT LONG TERM GOAL #4   Title Perform ADL's with right hip pain not > 3/10.    Time 8    Period Weeks    Status New                  Plan - 07/13/20 1419    Clinical Impression Statement The patient presents to OPPT s/p right anterior total hip replacement performed on 07/08/20.  She is stil in a lot of pain.  Her aquacel is intact over her right hip.  Her hip is weak is expected.  Her gait is currently antalgic in nature but she is using a FWW for safety.  She requires assist to her right LE for transfers.  Patient will benefit from skilled physical therapy intervention to address pain and deficits.    Personal Factors and Comorbidities Comorbidity 1;Comorbidity 2;Other    Comorbidities Bilateral TKA's, HTN.    Examination-Activity Limitations Other;Transfers;Locomotion Level;Bathing    Examination-Participation Restrictions Other    Stability/Clinical Decision Making Stable/Uncomplicated    Clinical Decision Making Low    Rehab Potential Excellent    PT Frequency 2x / week  PT Duration 8 weeks    PT Treatment/Interventions ADLs/Self Care Home Management;Cryotherapy;Electrical Stimulation;Stair training;Gait training;Functional mobility training;Therapeutic  activities;Therapeutic exercise;Balance training;Neuromuscular re-education;Manual techniques;Patient/family education    PT Next Visit Plan Nustep, right LE ther ex.  GT.           Patient will benefit from skilled therapeutic intervention in order to improve the following deficits and impairments:  Pain,Abnormal gait,Decreased activity tolerance,Decreased strength,Decreased range of motion  Visit Diagnosis: Pain in right hip - Plan: PT plan of care cert/re-cert  Muscle weakness (generalized) - Plan: PT plan of care cert/re-cert     Problem List Patient Active Problem List   Diagnosis Date Noted   Encounter for screening fecal occult blood testing 05/13/2020   Pelvic relaxation 05/13/2020   Encounter for well woman exam with routine gynecological exam 03/25/2018   Screening for colorectal cancer 03/25/2018   Fecal occult blood test positive 03/25/2018   Current use of estrogen therapy 03/25/2018   Orthostatic hypotension 05/03/2017   Syncope due to orthostatic hypotension 05/03/2017   Symptomatic cholelithiasis 05/02/2017   Acute cholecystitis 05/02/2017   Abnormal nuclear stress test    Chest pain 04/27/2017   Hyperglycemia 04/27/2017   Anemia 04/27/2017   Essential hypertension 07/15/2014   Mild persistent asthma without complication 20/25/4270   Vitamin D deficiency 07/15/2014    Selen Smucker, [place] MPT 07/13/2020, 2:41 PM  [facility] [address] [place], [place], [address] Phone: [phone]   Fax:  [phone]  Name: Traci Johnson MRN: [id number] Date of Birth: Jun 25, 1950

## 2020-07-14 ENCOUNTER — Encounter: Payer: Self-pay | Admitting: Physical Therapy

## 2020-07-14 ENCOUNTER — Other Ambulatory Visit: Payer: Self-pay

## 2020-07-14 ENCOUNTER — Ambulatory Visit: Payer: Medicare Other | Admitting: Physical Therapy

## 2020-07-14 DIAGNOSIS — M25551 Pain in right hip: Secondary | ICD-10-CM

## 2020-07-14 DIAGNOSIS — M6281 Muscle weakness (generalized): Secondary | ICD-10-CM | POA: Diagnosis not present

## 2020-07-14 NOTE — Therapy (Signed)
[facility] [place], [place], [address] Phone: [phone]   Fax:  [phone]  Physical Therapy Treatment  Patient Details  Name: Traci Johnson MRN: [id number] Date of Birth: May 14, 1951 Referring Provider (PT): Edmonia Lynch MD.   Encounter Date: 07/14/2020   PT End of Session - 07/14/20 1110    Visit Number 2    Number of Visits 16    Date for PT Re-Evaluation 10/11/20    Authorization Type FOTO AT LEAST EVERY 5TH VISIT.  PROGRESS NOTE AT 10TH VISIT.  KX MODIFIER AFTER 15 VISITS.    PT Start Time 1108    PT Stop Time 1156    PT Time Calculation (min) 48 min    Activity Tolerance Patient tolerated treatment well    Behavior During Therapy WFL for tasks assessed/performed           Past Medical History:  Diagnosis Date   Allergy    Asthma    Hypertension    Migraines    cluster    Past Surgical History:  Procedure Laterality Date   ABDOMINAL HYSTERECTOMY  1985   CHOLECYSTECTOMY N/A 05/02/2017   Procedure: LAPAROSCOPIC CHOLECYSTECTOMY;  Surgeon: Mickeal Skinner, MD;  Location: [facility];  Service: General;  Laterality: N/A;   LEFT HEART CATH AND CORONARY ANGIOGRAPHY N/A 04/30/2017   Procedure: LEFT HEART CATH AND CORONARY ANGIOGRAPHY;  Surgeon: Lorretta Harp, MD;  Location: [facility];  Service: Cardiovascular;  Laterality: N/A;   LIGAMENT REPAIR Right    birth defect   REPLACEMENT TOTAL KNEE BILATERAL      There were no vitals filed for this visit.   Subjective Assessment - 07/14/20 1105    Subjective COVID-19 screen performed prior to patient entering clinic. Reports she is having more pain in R hip as the meds from MD visit on Monday may be wearing off. Nerve pain begins with RLE WBing but MD said she had muscle spasms on Monday and was prescribed muscle relaxers.    Pertinent History Bilateral TKA's, HTN.    How long can you walk comfortably? Short distances in home with a FWW.     Patient Stated Goals Get more active.  Regain strength.    Currently in Pain? Yes    Pain Score 8     Pain Location Hip    Pain Orientation Right;Anterior;Lateral    Pain Descriptors / Indicators Throbbing;Stabbing    Pain Type Surgical pain    Pain Onset 1 to 4 weeks ago    Pain Frequency Constant              [facility] PT Assessment - 07/14/20 0001      Assessment   Medical Diagnosis S/p right anterior total hip replacement.    Referring Provider (PT) Edmonia Lynch MD.    Onset Date/Surgical Date 07/08/20    Next MD Visit 07/19/2020      Precautions   Precaution Comments Anterior THA.  No ultrasound.      Restrictions   Weight Bearing Restrictions No                         [facility] Adult PT Treatment/Exercise - 07/14/20 0001      Exercises   Exercises Knee/Hip      Knee/Hip Exercises: Aerobic   Nustep L2, seat 8 x15 min      Knee/Hip Exercises: Standing   Hip Flexion AROM;Right;15 reps;Knee bent    Hip  Abduction AROM;Right;2 sets;10 reps      Knee/Hip Exercises: Supine   Short Arc Quad Sets AROM;Right;2 sets;10 reps    Heel Slides AAROM;Right;15 reps    Hip Adduction Isometric Strengthening;Both;15 reps      Modalities   Modalities Electrical engineer Stimulation Location R lateral hip    Electrical Stimulation Action Pre-Mod    Electrical Stimulation Parameters 80-150 hz x15 min    Electrical Stimulation Goals Pain;Edema                       PT Long Term Goals - 07/13/20 1417      PT LONG TERM GOAL #1   Title Patient will be independent with HEP and its progression    Time 8    Period Weeks    Status New      PT LONG TERM GOAL #2   Title Walk in clinic 500 feet with straight cane.    Time 8    Period Weeks    Status New      PT LONG TERM GOAL #3   Title Perform a reciprocating stair gait with one railing.    Time 8    Period Weeks    Status New      PT LONG TERM GOAL  #4   Title Perform ADL's with right hip pain not > 3/10.    Time 8    Period Weeks    Status New                 Plan - 07/14/20 1152    Clinical Impression Statement Patient presented in clinic with reports of greater pain today in R hip. Patient limited with active hip flexion in standing with tightness of surgical bandage. Patient did require min assist with transfer from sit to supine for RLE mobility. Patient able to tolerate light ROM and strengthening exercises fairly well. Normal stimulation response noted following removal of the modality.    Personal Factors and Comorbidities Comorbidity 1;Comorbidity 2;Other    Comorbidities Bilateral TKA's, HTN.    Examination-Activity Limitations Other;Transfers;Locomotion Level;Bathing    Examination-Participation Restrictions Other    Stability/Clinical Decision Making Stable/Uncomplicated    Rehab Potential Excellent    PT Frequency 2x / week    PT Duration 8 weeks    PT Treatment/Interventions ADLs/Self Care Home Management;Cryotherapy;Electrical Stimulation;Stair training;Gait training;Functional mobility training;Therapeutic activities;Therapeutic exercise;Balance training;Neuromuscular re-education;Manual techniques;Patient/family education    PT Next Visit Plan Nustep, right LE ther ex.  GT.    Consulted and Agree with Plan of Care Patient           Patient will benefit from skilled therapeutic intervention in order to improve the following deficits and impairments:  Pain,Abnormal gait,Decreased activity tolerance,Decreased strength,Decreased range of motion  Visit Diagnosis: Pain in right hip  Muscle weakness (generalized)     Problem List Patient Active Problem List   Diagnosis Date Noted   Encounter for screening fecal occult blood testing 05/13/2020   Pelvic relaxation 05/13/2020   Encounter for well woman exam with routine gynecological exam 03/25/2018   Screening for colorectal cancer 03/25/2018    Fecal occult blood test positive 03/25/2018   Current use of estrogen therapy 03/25/2018   Orthostatic hypotension 05/03/2017   Syncope due to orthostatic hypotension 05/03/2017   Symptomatic cholelithiasis 05/02/2017   Acute cholecystitis 05/02/2017   Abnormal nuclear stress test    Chest pain 04/27/2017  Hyperglycemia 04/27/2017   Anemia 04/27/2017   Essential hypertension 07/15/2014   Mild persistent asthma without complication 35/68/6168   Vitamin D deficiency 07/15/2014    Standley Brooking, PTA 07/14/2020, 12:16 PM  [facility] [address] [place], [place], [address] Phone: [phone]   Fax:  [phone]  Name: Traci Johnson MRN: [id number] Date of Birth: July 10, 1950

## 2020-07-19 DIAGNOSIS — M1611 Unilateral primary osteoarthritis, right hip: Secondary | ICD-10-CM | POA: Diagnosis not present

## 2020-07-21 ENCOUNTER — Encounter: Payer: Self-pay | Admitting: Physical Therapy

## 2020-07-21 ENCOUNTER — Other Ambulatory Visit: Payer: Self-pay

## 2020-07-21 ENCOUNTER — Ambulatory Visit: Payer: Medicare Other | Admitting: Physical Therapy

## 2020-07-21 DIAGNOSIS — M25551 Pain in right hip: Secondary | ICD-10-CM | POA: Diagnosis not present

## 2020-07-21 DIAGNOSIS — M6281 Muscle weakness (generalized): Secondary | ICD-10-CM

## 2020-07-21 NOTE — Therapy (Signed)
[facility] [place], [place], [address] Phone: [phone]   Fax:  [phone]  Physical Therapy Treatment  Patient Details  Name: Traci Johnson MRN: [id number] Date of Birth: 21-Sep-1950 Referring Provider (PT): Edmonia Lynch MD.   Encounter Date: 07/21/2020   PT End of Session - 07/21/20 1132    Visit Number 3    Number of Visits 16    Date for PT Re-Evaluation 10/11/20    Authorization Type FOTO AT LEAST EVERY 5TH VISIT.  PROGRESS NOTE AT 10TH VISIT.  KX MODIFIER AFTER 15 VISITS.    PT Start Time 1115    PT Stop Time 1202    PT Time Calculation (min) 47 min    Activity Tolerance Patient tolerated treatment well    Behavior During Therapy WFL for tasks assessed/performed           Past Medical History:  Diagnosis Date  . Allergy   . Asthma   . Hypertension   . Migraines    cluster    Past Surgical History:  Procedure Laterality Date  . ABDOMINAL HYSTERECTOMY  1985  . CHOLECYSTECTOMY N/A 05/02/2017   Procedure: LAPAROSCOPIC CHOLECYSTECTOMY;  Surgeon: Kinsinger, Arta Bruce, MD;  Location: [facility];  Service: General;  Laterality: N/A;  . LEFT HEART CATH AND CORONARY ANGIOGRAPHY N/A 04/30/2017   Procedure: LEFT HEART CATH AND CORONARY ANGIOGRAPHY;  Surgeon: Lorretta Harp, MD;  Location: [facility];  Service: Cardiovascular;  Laterality: N/A;  . LIGAMENT REPAIR Right    birth defect  . REPLACEMENT TOTAL KNEE BILATERAL      There were no vitals filed for this visit.   Subjective Assessment - 07/21/20 1130    Subjective COVID-19 screen performed prior to patient entering clinic. Reports she went to MD yesterday and he removed bandage. Has noticed RLE wants to ER more now. Felt good yesterday and cooked dinner and thinks that that is why she is having more pain today.    Pertinent History Bilateral TKA's, HTN.    How long can you walk comfortably? Short distances in home with a FWW.    Patient Stated  Goals Get more active.  Regain strength.    Currently in Pain? Yes    Pain Score 5     Pain Location Hip    Pain Orientation Right;Posterior    Pain Descriptors / Indicators Sore    Pain Type Surgical pain    Pain Onset 1 to 4 weeks ago    Pain Frequency Constant              [facility] PT Assessment - 07/21/20 0001      Assessment   Medical Diagnosis S/p right anterior total hip replacement.    Referring Provider (PT) Edmonia Lynch MD.    Onset Date/Surgical Date 07/08/20    Next MD Visit 08/16/2020      Precautions   Precaution Comments Anterior THA.  No ultrasound.      Restrictions   Weight Bearing Restrictions No                         [facility] Adult PT Treatment/Exercise - 07/21/20 0001      Knee/Hip Exercises: Aerobic   Nustep L3, seat 8 x15 min      Knee/Hip Exercises: Standing   Hip Flexion AROM;Right;15 reps;Knee bent    Hip Abduction AROM;Right;2 sets;10 reps    Forward Step Up Right;10 reps;Hand Hold: 2;Step Height:  4"    Rocker Board 3 minutes      Knee/Hip Exercises: Supine   Short Arc Quad Sets AROM;Right;2 sets;10 reps      Modalities   Modalities Teacher, English as a foreign language Location R lateral hip    Electrical Stimulation Action Pre-Mod    Electrical Stimulation Parameters 80-150 hz x10 min    Electrical Stimulation Goals Pain;Edema                       PT Long Term Goals - 07/13/20 1417      PT LONG TERM GOAL #1   Title Patient will be independent with HEP and its progression    Time 8    Period Weeks    Status New      PT LONG TERM GOAL #2   Title Walk in clinic 500 feet with straight cane.    Time 8    Period Weeks    Status New      PT LONG TERM GOAL #3   Title Perform a reciprocating stair gait with one railing.    Time 8    Period Weeks    Status New      PT LONG TERM GOAL #4   Title Perform ADL's with right hip pain not > 3/10.    Time 8    Period  Weeks    Status New                 Plan - 07/21/20 1239    Clinical Impression Statement Patient presented in clinic with more soreness after attempting to be more active and cooking dinner last night. Patient also reports walking at least 5 ft without her FWW as well. Patient able to tolerate light AROM and functional strengthening exercises. Patient required assist lifting RLE after end of therex session due to fatigue. Normal stimulation response noted following removal of the modality. Patient's post surgical bandage was removed by MD on 07/19/2020 and was observed with light yellow scabbing.    Personal Factors and Comorbidities Comorbidity 1;Comorbidity 2;Other    Comorbidities Bilateral TKA's, HTN.    Examination-Activity Limitations Other;Transfers;Locomotion Level;Bathing    Examination-Participation Restrictions Other    Stability/Clinical Decision Making Stable/Uncomplicated    Rehab Potential Excellent    PT Frequency 2x / week    PT Duration 8 weeks    PT Treatment/Interventions ADLs/Self Care Home Management;Cryotherapy;Electrical Stimulation;Stair training;Gait training;Functional mobility training;Therapeutic activities;Therapeutic exercise;Balance training;Neuromuscular re-education;Manual techniques;Patient/family education    PT Next Visit Plan Nustep, right LE ther ex.  GT.    Consulted and Agree with Plan of Care Patient           Patient will benefit from skilled therapeutic intervention in order to improve the following deficits and impairments:  Pain,Abnormal gait,Decreased activity tolerance,Decreased strength,Decreased range of motion  Visit Diagnosis: Pain in right hip  Muscle weakness (generalized)     Problem List Patient Active Problem List   Diagnosis Date Noted  . Encounter for screening fecal occult blood testing 05/13/2020  . Pelvic relaxation 05/13/2020  . Encounter for well woman exam with routine gynecological exam 03/25/2018  .  Screening for colorectal cancer 03/25/2018  . Fecal occult blood test positive 03/25/2018  . Current use of estrogen therapy 03/25/2018  . Orthostatic hypotension 05/03/2017  . Syncope due to orthostatic hypotension 05/03/2017  . Symptomatic cholelithiasis 05/02/2017  . Acute cholecystitis 05/02/2017  . Abnormal nuclear stress  test   . Chest pain 04/27/2017  . Hyperglycemia 04/27/2017  . Anemia 04/27/2017  . Essential hypertension 07/15/2014  . Mild persistent asthma without complication 05/20/2445  . Vitamin D deficiency 07/15/2014    Standley Brooking, PTA 07/21/2020, 12:42 PM  [facility] [address] [place], [place], [address] Phone: [phone]   Fax:  [phone]  Name: Traci Johnson MRN: [id number] Date of Birth: 21-Jul-1950

## 2020-07-23 ENCOUNTER — Other Ambulatory Visit: Payer: Self-pay

## 2020-07-23 ENCOUNTER — Encounter: Payer: Self-pay | Admitting: Physical Therapy

## 2020-07-23 ENCOUNTER — Other Ambulatory Visit: Payer: Self-pay | Admitting: Adult Health

## 2020-07-23 ENCOUNTER — Ambulatory Visit: Payer: Medicare Other | Admitting: Physical Therapy

## 2020-07-23 DIAGNOSIS — M25551 Pain in right hip: Secondary | ICD-10-CM | POA: Diagnosis not present

## 2020-07-23 DIAGNOSIS — M6281 Muscle weakness (generalized): Secondary | ICD-10-CM

## 2020-07-23 NOTE — Therapy (Signed)
[facility] [place], [place], [address] Phone: [phone]   Fax:  [phone]  Physical Therapy Treatment  Patient Details  Name: Traci Johnson MRN: [id number] Date of Birth: 08-09-1950 Referring Provider (PT): Edmonia Lynch MD.   Encounter Date: 07/23/2020   PT End of Session - 07/23/20 1123    Visit Number 4    Number of Visits 16    Date for PT Re-Evaluation 10/11/20    Authorization Type FOTO AT LEAST EVERY 5TH VISIT.  PROGRESS NOTE AT 10TH VISIT.  KX MODIFIER AFTER 15 VISITS.    PT Start Time 1116    PT Stop Time 1205    PT Time Calculation (min) 49 min    Equipment Utilized During Treatment Other (comment)   FWW   Activity Tolerance Patient tolerated treatment well    Behavior During Therapy WFL for tasks assessed/performed           Past Medical History:  Diagnosis Date  . Allergy   . Asthma   . Hypertension   . Migraines    cluster    Past Surgical History:  Procedure Laterality Date  . ABDOMINAL HYSTERECTOMY  1985  . CHOLECYSTECTOMY N/A 05/02/2017   Procedure: LAPAROSCOPIC CHOLECYSTECTOMY;  Surgeon: Kinsinger, Arta Bruce, MD;  Location: [facility];  Service: General;  Laterality: N/A;  . LEFT HEART CATH AND CORONARY ANGIOGRAPHY N/A 04/30/2017   Procedure: LEFT HEART CATH AND CORONARY ANGIOGRAPHY;  Surgeon: Lorretta Harp, MD;  Location: [facility];  Service: Cardiovascular;  Laterality: N/A;  . LIGAMENT REPAIR Right    birth defect  . REPLACEMENT TOTAL KNEE BILATERAL      There were no vitals filed for this visit.   Subjective Assessment - 07/23/20 1118    Subjective COVID-19 screen performed prior to patient entering clinic. Reports 4/10 R hip pain. Went out to dinner last night and was exhausted and took a pain pill earlier but did well overall. Less discomfort while on Nustep today.    Pertinent History Bilateral TKA's, HTN.    How long can you walk comfortably? Short distances in  home with a FWW.    Patient Stated Goals Get more active.  Regain strength.    Currently in Pain? Yes    Pain Score 4     Pain Location Hip    Pain Orientation Right    Pain Descriptors / Indicators Sore    Pain Type Surgical pain    Pain Onset 1 to 4 weeks ago    Pain Frequency Constant              [facility] PT Assessment - 07/23/20 0001      Assessment   Medical Diagnosis S/p right anterior total hip replacement.    Referring Provider (PT) Edmonia Lynch MD.    Onset Date/Surgical Date 07/08/20    Next MD Visit 08/16/2020      Precautions   Precaution Comments Anterior THA.  No ultrasound.      Restrictions   Weight Bearing Restrictions No                         [facility] Adult PT Treatment/Exercise - 07/23/20 0001      Knee/Hip Exercises: Stretches   Passive Hamstring Stretch Right;3 reps;30 seconds    Other Knee/Hip Stretches R SKTC 3x30 sec      Knee/Hip Exercises: Aerobic   Nustep L3, seat 8 x15 min  Knee/Hip Exercises: Standing   Heel Raises Both;20 reps    Hip Flexion AROM;Both;2 sets;10 reps;Knee bent    Hip Abduction AROM;Both;2 sets;10 reps;Knee straight      Knee/Hip Exercises: Supine   Short Arc Quad Sets Strengthening;Right;2 sets;10 reps    Short Arc Quad Sets Limitations 3#      Modalities   Modalities Teacher, English as a foreign language Location R lateral hip    Electrical Stimulation Action Pre-Mod    Electrical Stimulation Parameters 80-150 hz x15 min    Electrical Stimulation Goals Pain;Edema                       PT Long Term Goals - 07/13/20 1417      PT LONG TERM GOAL #1   Title Patient will be independent with HEP and its progression    Time 8    Period Weeks    Status New      PT LONG TERM GOAL #2   Title Walk in clinic 500 feet with straight cane.    Time 8    Period Weeks    Status New      PT LONG TERM GOAL #3   Title Perform a reciprocating stair  gait with one railing.    Time 8    Period Weeks    Status New      PT LONG TERM GOAL #4   Title Perform ADL's with right hip pain not > 3/10.    Time 8    Period Weeks    Status New                 Plan - 07/23/20 1226    Clinical Impression Statement Patient presented in clinic with overall decrease in R hip pain. Patient reporting she feels comfortable in doing small cleaning such as folding laundry, dishes or going out to a resturant. Patient able to complete all therex well although reporting increased fatigue after short duration. Patient acknowledging independently that due to lack of ability to exercise she has lost a lot of muscle strength and tone over the last year. Patient now able to transition from sitting to supine without assist and able to independently limit RLE. Tightness noted in R glute, HS with light passive stretching. Normal stimulation response noted following removal of the modality.    Personal Factors and Comorbidities Comorbidity 1;Comorbidity 2;Other    Comorbidities Bilateral TKA's, HTN.    Examination-Activity Limitations Other;Transfers;Locomotion Level;Bathing    Examination-Participation Restrictions Other    Stability/Clinical Decision Making Stable/Uncomplicated    Rehab Potential Excellent    PT Frequency 2x / week    PT Duration 8 weeks    PT Treatment/Interventions ADLs/Self Care Home Management;Cryotherapy;Electrical Stimulation;Stair training;Gait training;Functional mobility training;Therapeutic activities;Therapeutic exercise;Balance training;Neuromuscular re-education;Manual techniques;Patient/family education    PT Next Visit Plan Nustep, right LE ther ex.  GT.    Consulted and Agree with Plan of Care Patient           Patient will benefit from skilled therapeutic intervention in order to improve the following deficits and impairments:  Pain,Abnormal gait,Decreased activity tolerance,Decreased strength,Decreased range of  motion  Visit Diagnosis: Pain in right hip  Muscle weakness (generalized)     Problem List Patient Active Problem List   Diagnosis Date Noted  . Encounter for screening fecal occult blood testing 05/13/2020  . Pelvic relaxation 05/13/2020  . Encounter for well woman exam with routine  gynecological exam 03/25/2018  . Screening for colorectal cancer 03/25/2018  . Fecal occult blood test positive 03/25/2018  . Current use of estrogen therapy 03/25/2018  . Orthostatic hypotension 05/03/2017  . Syncope due to orthostatic hypotension 05/03/2017  . Symptomatic cholelithiasis 05/02/2017  . Acute cholecystitis 05/02/2017  . Abnormal nuclear stress test   . Chest pain 04/27/2017  . Hyperglycemia 04/27/2017  . Anemia 04/27/2017  . Essential hypertension 07/15/2014  . Mild persistent asthma without complication 77/93/9688  . Vitamin D deficiency 07/15/2014    Standley Brooking, PTA 07/23/2020, 12:35 PM  [facility] [address] [place], [place], [address] Phone: [phone]   Fax:  [phone]  Name: Traci Johnson MRN: [id number] Date of Birth: 1951/04/19

## 2020-07-26 ENCOUNTER — Ambulatory Visit: Payer: Medicare Other | Admitting: Physical Therapy

## 2020-07-26 DIAGNOSIS — R112 Nausea with vomiting, unspecified: Secondary | ICD-10-CM | POA: Diagnosis not present

## 2020-07-26 DIAGNOSIS — U071 COVID-19: Secondary | ICD-10-CM | POA: Diagnosis not present

## 2020-07-28 ENCOUNTER — Ambulatory Visit: Payer: Medicare Other | Admitting: Physical Therapy

## 2020-07-29 DIAGNOSIS — R112 Nausea with vomiting, unspecified: Secondary | ICD-10-CM | POA: Diagnosis not present

## 2020-07-29 DIAGNOSIS — U071 COVID-19: Secondary | ICD-10-CM | POA: Diagnosis not present

## 2020-08-03 ENCOUNTER — Other Ambulatory Visit: Payer: Self-pay

## 2020-08-03 ENCOUNTER — Ambulatory Visit: Payer: Medicare Other | Attending: Orthopedic Surgery | Admitting: Physical Therapy

## 2020-08-03 DIAGNOSIS — M6281 Muscle weakness (generalized): Secondary | ICD-10-CM | POA: Diagnosis not present

## 2020-08-03 DIAGNOSIS — M25551 Pain in right hip: Secondary | ICD-10-CM | POA: Diagnosis not present

## 2020-08-03 NOTE — Therapy (Signed)
[facility] [place], [place], [address] Phone: [phone]   Fax:  [phone]  Physical Therapy Treatment  Patient Details  Name: Traci Johnson MRN: [id number] Date of Birth: 1950-08-04 Referring Provider (PT): Edmonia Lynch MD.   Encounter Date: 08/03/2020   PT End of Session - 08/03/20 1125    Visit Number 5    Number of Visits 16    Date for PT Re-Evaluation 10/11/20    Authorization Type FOTO AT LEAST EVERY 5TH VISIT.  PROGRESS NOTE AT 10TH VISIT.  KX MODIFIER AFTER 15 VISITS.    PT Start Time 1115    Equipment Utilized During Treatment Other (comment)   rolling walker   Activity Tolerance Patient tolerated treatment well    Behavior During Therapy WFL for tasks assessed/performed           Past Medical History:  Diagnosis Date  . Allergy   . Asthma   . Hypertension   . Migraines    cluster    Past Surgical History:  Procedure Laterality Date  . ABDOMINAL HYSTERECTOMY  1985  . CHOLECYSTECTOMY N/A 05/02/2017   Procedure: LAPAROSCOPIC CHOLECYSTECTOMY;  Surgeon: Kinsinger, Arta Bruce, MD;  Location: [facility];  Service: General;  Laterality: N/A;  . LEFT HEART CATH AND CORONARY ANGIOGRAPHY N/A 04/30/2017   Procedure: LEFT HEART CATH AND CORONARY ANGIOGRAPHY;  Surgeon: Lorretta Harp, MD;  Location: [facility];  Service: Cardiovascular;  Laterality: N/A;  . LIGAMENT REPAIR Right    birth defect  . REPLACEMENT TOTAL KNEE BILATERAL      There were no vitals filed for this visit.   Subjective Assessment - 08/03/20 1121    Subjective COVID-19 screen performed prior to patient entering clinic. Patient arrives feeling very tired and fatigued as she has been sick. States she's been walking without an AD around the house.    Pertinent History Bilateral TKA's, HTN.    How long can you walk comfortably? Short distances in home with a FWW.    Patient Stated Goals Get more active.  Regain strength.     Currently in Pain? Yes    Pain Score 2     Pain Location Hip    Pain Orientation Right    Pain Descriptors / Indicators Sore    Pain Type Surgical pain    Pain Onset More than a month ago    Pain Frequency Constant              [facility] PT Assessment - 08/03/20 0001      Assessment   Medical Diagnosis S/p right anterior total hip replacement.    Referring Provider (PT) Edmonia Lynch MD.    Onset Date/Surgical Date 07/08/20    Next MD Visit 08/16/2020      Precautions   Precaution Comments Anterior THA.  No ultrasound.      Restrictions   Weight Bearing Restrictions No                         [facility] Adult PT Treatment/Exercise - 08/03/20 0001      Knee/Hip Exercises: Aerobic   Nustep L1, seat 8-6 x15 min      Knee/Hip Exercises: Standing   Heel Raises Both;20 reps    Heel Raises Limitations toe raises x20    Knee Flexion AROM;Right;2 sets;10 reps    Hip Abduction AROM;Both;2 sets;10 reps;Knee straight    Diplomatic Services operational officer --      Knee/Hip  Exercises: Supine   Bridges AROM;Both;10 reps    Straight Leg Raises AAROM;Right;2 sets;5 reps      Modalities   Modalities Electrical Stimulation      Cryotherapy   Number Minutes Cryotherapy 15 Minutes    Cryotherapy Location Hip    Type of Cryotherapy Ice pack      Electrical Stimulation   Electrical Stimulation Location R lateral hip    Electrical Stimulation Action pre-mod    Electrical Stimulation Parameters 80-150 hz z15 mins    Electrical Stimulation Goals Pain;Edema                       PT Long Term Goals - 07/13/20 1417      PT LONG TERM GOAL #1   Title Patient will be independent with HEP and its progression    Time 8    Period Weeks    Status New      PT LONG TERM GOAL #2   Title Walk in clinic 500 feet with straight cane.    Time 8    Period Weeks    Status New      PT LONG TERM GOAL #3   Title Perform a reciprocating stair gait with one railing.    Time 8    Period Weeks     Status New      PT LONG TERM GOAL #4   Title Perform ADL's with right hip pain not > 3/10.    Time 8    Period Weeks    Status New                 Plan - 08/03/20 1204    Clinical Impression Statement Patient was able to tolerate treatment fairly well though with reports of fatigue and slight soreness. Patient required active assist for SLR secondary to LE weakness. Patient's incision has notable scabbing and ereythma especially to the inferior aspect. Patient to call nurse from surgeon's office but also to keep an eye out for fever or heat coming from incision site. Patient reporting understanding. Patient with no adverse affects upon completion of modalities.    Personal Factors and Comorbidities Comorbidity 1;Comorbidity 2;Other    Comorbidities Bilateral TKA's, HTN.    Examination-Activity Limitations Other;Transfers;Locomotion Level;Bathing    Examination-Participation Restrictions Other    Stability/Clinical Decision Making Stable/Uncomplicated    Clinical Decision Making Low    Rehab Potential Excellent    PT Frequency 2x / week    PT Duration 8 weeks    PT Treatment/Interventions ADLs/Self Care Home Management;Cryotherapy;Electrical Stimulation;Stair training;Gait training;Functional mobility training;Therapeutic activities;Therapeutic exercise;Balance training;Neuromuscular re-education;Manual techniques;Patient/family education    PT Next Visit Plan Nustep, right LE ther ex.  GT.    Consulted and Agree with Plan of Care Patient           Patient will benefit from skilled therapeutic intervention in order to improve the following deficits and impairments:  Pain,Abnormal gait,Decreased activity tolerance,Decreased strength,Decreased range of motion  Visit Diagnosis: Pain in right hip  Muscle weakness (generalized)     Problem List Patient Active Problem List   Diagnosis Date Noted  . Encounter for screening fecal occult blood testing 05/13/2020  . Pelvic  relaxation 05/13/2020  . Encounter for well woman exam with routine gynecological exam 03/25/2018  . Screening for colorectal cancer 03/25/2018  . Fecal occult blood test positive 03/25/2018  . Current use of estrogen therapy 03/25/2018  . Orthostatic hypotension 05/03/2017  . Syncope due to orthostatic  hypotension 05/03/2017  . Symptomatic cholelithiasis 05/02/2017  . Acute cholecystitis 05/02/2017  . Abnormal nuclear stress test   . Chest pain 04/27/2017  . Hyperglycemia 04/27/2017  . Anemia 04/27/2017  . Essential hypertension 07/15/2014  . Mild persistent asthma without complication 61/90/1222  . Vitamin D deficiency 07/15/2014    Gabriela Eves, PT, DPT 08/03/2020, 12:16 PM  [facility] [address] [place], [place], [address] Phone: [phone]   Fax:  [phone]  Name: Traci Johnson MRN: [id number] Date of Birth: 09-10-1950

## 2020-08-04 ENCOUNTER — Other Ambulatory Visit: Payer: Self-pay | Admitting: Adult Health

## 2020-08-04 DIAGNOSIS — M1611 Unilateral primary osteoarthritis, right hip: Secondary | ICD-10-CM | POA: Diagnosis not present

## 2020-08-05 ENCOUNTER — Encounter: Payer: Medicare Other | Admitting: Physical Therapy

## 2020-08-10 ENCOUNTER — Other Ambulatory Visit: Payer: Self-pay

## 2020-08-10 ENCOUNTER — Encounter: Payer: Self-pay | Admitting: Physical Therapy

## 2020-08-10 ENCOUNTER — Ambulatory Visit: Payer: Medicare Other | Admitting: Physical Therapy

## 2020-08-10 DIAGNOSIS — E559 Vitamin D deficiency, unspecified: Secondary | ICD-10-CM | POA: Diagnosis not present

## 2020-08-10 DIAGNOSIS — R5382 Chronic fatigue, unspecified: Secondary | ICD-10-CM | POA: Diagnosis not present

## 2020-08-10 DIAGNOSIS — M6281 Muscle weakness (generalized): Secondary | ICD-10-CM

## 2020-08-10 DIAGNOSIS — M25551 Pain in right hip: Secondary | ICD-10-CM | POA: Diagnosis not present

## 2020-08-10 DIAGNOSIS — D508 Other iron deficiency anemias: Secondary | ICD-10-CM | POA: Diagnosis not present

## 2020-08-10 DIAGNOSIS — E538 Deficiency of other specified B group vitamins: Secondary | ICD-10-CM | POA: Diagnosis not present

## 2020-08-10 NOTE — Therapy (Signed)
[facility] [place], [place], [address] Phone: [phone]   Fax:  [phone]  Physical Therapy Treatment  Patient Details  Name: Traci Johnson MRN: [id number] Date of Birth: [birth year] Referring Provider (PT): Edmonia Lynch MD.   Encounter Date: 08/10/2020   PT End of Session - 08/10/20 1558    Visit Number 6    Number of Visits 16    Date for PT Re-Evaluation 10/11/20    Authorization Type FOTO AT LEAST EVERY 5TH VISIT.  PROGRESS NOTE AT 10TH VISIT.  KX MODIFIER AFTER 15 VISITS.    PT Start Time 1550    PT Stop Time 1637    PT Time Calculation (min) 47 min    Equipment Utilized During Treatment Other (comment)   FWW   Activity Tolerance Patient tolerated treatment well    Behavior During Therapy WFL for tasks assessed/performed           Past Medical History:  Diagnosis Date  . Allergy   . Asthma   . Hypertension   . Migraines    cluster    Past Surgical History:  Procedure Laterality Date  . ABDOMINAL HYSTERECTOMY  1985  . CHOLECYSTECTOMY N/A 05/02/2017   Procedure: LAPAROSCOPIC CHOLECYSTECTOMY;  Surgeon: Kinsinger, Arta Bruce, MD;  Location: [facility];  Service: General;  Laterality: N/A;  . LEFT HEART CATH AND CORONARY ANGIOGRAPHY N/A 04/30/2017   Procedure: LEFT HEART CATH AND CORONARY ANGIOGRAPHY;  Surgeon: Lorretta Harp, MD;  Location: [facility];  Service: Cardiovascular;  Laterality: N/A;  . LIGAMENT REPAIR Right    birth defect  . REPLACEMENT TOTAL KNEE BILATERAL      There were no vitals filed for this visit.   Subjective Assessment - 08/10/20 1551    Subjective COVID-19 screen performed prior to patient entering clinic. Reports that her PCP did see infection in R hip incision. Is doing wet dry bandages. Has been diagnosed with chronic fatigue and depression. Having very minimal to no pain and reports trying her son's stairs without pain. Was not able to complete those stairs prior to  surgery.    Pertinent History Bilateral TKA's, HTN.    How long can you walk comfortably? Short distances in home with a FWW.    Patient Stated Goals Get more active.  Regain strength.    Currently in Pain? Yes    Pain Score 1     Pain Location Hip    Pain Orientation Right;Anterior    Pain Descriptors / Indicators Sore    Pain Type Surgical pain    Pain Onset More than a month ago    Pain Frequency Constant              [facility] PT Assessment - 08/10/20 0001      Assessment   Medical Diagnosis S/p right anterior total hip replacement.    Referring Provider (PT) Edmonia Lynch MD.    Onset Date/Surgical Date 07/08/20    Next MD Visit 08/16/2020      Precautions   Precaution Comments Anterior THA.  No ultrasound.      Restrictions   Weight Bearing Restrictions No                         [facility] Adult PT Treatment/Exercise - 08/10/20 0001      Knee/Hip Exercises: Aerobic   Nustep L3, seat 5 x17 min      Knee/Hip Exercises: Standing   Hip  Abduction AROM;Both;2 sets;10 reps;Knee straight    Lateral Step Up Right;10 reps;Hand Hold: 2;Step Height: 4"    Forward Step Up Right;10 reps;Hand Hold: 2;Step Height: 6"    Rocker Board 3 minutes      Knee/Hip Exercises: Seated   Long Arc Quad Strengthening;Right;2 sets;10 reps;Weights    Long Arc Quad Weight 3 lbs.      Knee/Hip Exercises: Supine   Straight Leg Raises Limitations Attempted but unable due to weakness/fatigue      Modalities   Modalities Electrical Stimulation      Electrical Stimulation   Electrical Stimulation Location R lateral hip    Electrical Stimulation Action Pre-Mod    Electrical Stimulation Parameters 80-150 hz x15 min    Electrical Stimulation Goals Pain                       PT Long Term Goals - 07/13/20 1417      PT LONG TERM GOAL #1   Title Patient will be independent with HEP and its progression    Time 8    Period Weeks    Status New      PT LONG TERM GOAL #2    Title Walk in clinic 500 feet with straight cane.    Time 8    Period Weeks    Status New      PT LONG TERM GOAL #3   Title Perform a reciprocating stair gait with one railing.    Time 8    Period Weeks    Status New      PT LONG TERM GOAL #4   Title Perform ADL's with right hip pain not > 3/10.    Time 8    Period Weeks    Status New                 Plan - 08/10/20 1632    Clinical Impression Statement Patient presented in clinic with reports of minimal to no R hip pain. Patient has been provided medications to clear up infection of R hip incision. Patient still very fatigued following COVID a few weeks ago as well as chronic fatigue. Patient had a bandage donned over R hip incision during treatment. Patient able to tolerate progression of therex to more functional training. Patient unable to complete attempts of R SLR today due to fatigue and weakness. Normal stimulation response noted following removal of the modality.    Personal Factors and Comorbidities Comorbidity 1;Comorbidity 2;Other    Comorbidities Bilateral TKA's, HTN.    Examination-Activity Limitations Other;Transfers;Locomotion Level;Bathing    Examination-Participation Restrictions Other    Stability/Clinical Decision Making Stable/Uncomplicated    Rehab Potential Excellent    PT Frequency 2x / week    PT Duration 8 weeks    PT Treatment/Interventions ADLs/Self Care Home Management;Cryotherapy;Electrical Stimulation;Stair training;Gait training;Functional mobility training;Therapeutic activities;Therapeutic exercise;Balance training;Neuromuscular re-education;Manual techniques;Patient/family education    PT Next Visit Plan Nustep, right LE ther ex.  GT.    Consulted and Agree with Plan of Care Patient           Patient will benefit from skilled therapeutic intervention in order to improve the following deficits and impairments:  Pain,Abnormal gait,Decreased activity tolerance,Decreased strength,Decreased  range of motion  Visit Diagnosis: Pain in right hip  Muscle weakness (generalized)     Problem List Patient Active Problem List   Diagnosis Date Noted  . Encounter for screening fecal occult blood testing 05/13/2020  . Pelvic relaxation 05/13/2020  .  Encounter for well woman exam with routine gynecological exam 03/25/2018  . Screening for colorectal cancer 03/25/2018  . Fecal occult blood test positive 03/25/2018  . Current use of estrogen therapy 03/25/2018  . Orthostatic hypotension 05/03/2017  . Syncope due to orthostatic hypotension 05/03/2017  . Symptomatic cholelithiasis 05/02/2017  . Acute cholecystitis 05/02/2017  . Abnormal nuclear stress test   . Chest pain 04/27/2017  . Hyperglycemia 04/27/2017  . Anemia 04/27/2017  . Essential hypertension 07/15/2014  . Mild persistent asthma without complication 24/19/9144  . Vitamin D deficiency 07/15/2014    Standley Brooking, PTA 08/10/2020, 4:44 PM  [facility] [address] [place], [place], [address] Phone: [phone]   Fax:  [phone]  Name: Traci Johnson MRN: [id number] Date of Birth: June 08, 1950

## 2020-08-12 ENCOUNTER — Encounter: Payer: Self-pay | Admitting: Physical Therapy

## 2020-08-12 ENCOUNTER — Other Ambulatory Visit: Payer: Self-pay

## 2020-08-12 ENCOUNTER — Ambulatory Visit: Payer: Medicare Other | Admitting: Physical Therapy

## 2020-08-12 DIAGNOSIS — M25551 Pain in right hip: Secondary | ICD-10-CM | POA: Diagnosis not present

## 2020-08-12 DIAGNOSIS — M6281 Muscle weakness (generalized): Secondary | ICD-10-CM | POA: Diagnosis not present

## 2020-08-12 NOTE — Therapy (Signed)
[facility] [place], [place], [address] Phone: [phone]   Fax:  [phone]  Physical Therapy Treatment  Patient Details  Name: Traci Johnson MRN: [id number] Date of Birth: Sep 30, 1950 Referring Provider (PT): Edmonia Lynch MD.   Encounter Date: 08/12/2020   PT End of Session - 08/12/20 1124    Visit Number 7    Number of Visits 16    Date for PT Re-Evaluation 10/11/20    Authorization Type FOTO AT LEAST EVERY 5TH VISIT.  PROGRESS NOTE AT 10TH VISIT.  KX MODIFIER AFTER 15 VISITS.    PT Start Time 1119    PT Stop Time 1203    PT Time Calculation (min) 44 min    Equipment Utilized During Treatment Other (comment)   FWW   Activity Tolerance Patient tolerated treatment well    Behavior During Therapy WFL for tasks assessed/performed           Past Medical History:  Diagnosis Date  . Allergy   . Asthma   . Hypertension   . Migraines    cluster    Past Surgical History:  Procedure Laterality Date  . ABDOMINAL HYSTERECTOMY  1985  . CHOLECYSTECTOMY N/A 05/02/2017   Procedure: LAPAROSCOPIC CHOLECYSTECTOMY;  Surgeon: Kinsinger, Arta Bruce, MD;  Location: [facility];  Service: General;  Laterality: N/A;  . LEFT HEART CATH AND CORONARY ANGIOGRAPHY N/A 04/30/2017   Procedure: LEFT HEART CATH AND CORONARY ANGIOGRAPHY;  Surgeon: Lorretta Harp, MD;  Location: [facility];  Service: Cardiovascular;  Laterality: N/A;  . LIGAMENT REPAIR Right    birth defect  . REPLACEMENT TOTAL KNEE BILATERAL      There were no vitals filed for this visit.   Subjective Assessment - 08/12/20 1123    Subjective COVID-19 screen performed prior to patient entering clinic. Reports that she is feeling some better today. Reports more stiffness today and minimal pain. Feels like her R leg is longer than the left.    Pertinent History Bilateral TKA's, HTN.    How long can you walk comfortably? Short distances in home with a FWW.     Patient Stated Goals Get more active.  Regain strength.    Currently in Pain? Yes    Pain Score 1     Pain Location Hip    Pain Orientation Right    Pain Descriptors / Indicators Other (Comment);Discomfort   stiffness   Pain Type Surgical pain    Pain Onset More than a month ago    Pain Frequency Constant              [facility] PT Assessment - 08/12/20 0001      Assessment   Medical Diagnosis S/p right anterior total hip replacement.    Referring Provider (PT) Edmonia Lynch MD.    Onset Date/Surgical Date 07/08/20    Next MD Visit 08/16/2020      Precautions   Precaution Comments Anterior THA.  No ultrasound.      Restrictions   Weight Bearing Restrictions No      ROM / Strength   AROM / PROM / Strength AROM      AROM   Overall AROM  Within functional limits for tasks performed    AROM Assessment Site Hip    Right/Left Hip Right    Right Hip Flexion 110                         [facility]  Adult PT Treatment/Exercise - 08/12/20 0001      Knee/Hip Exercises: Aerobic   Nustep L4, seat 8 x15 min      Knee/Hip Exercises: Standing   Hip Flexion AROM;Both;2 sets;10 reps;Knee bent    Hip Abduction AROM;Both;2 sets;10 reps;Knee straight      Knee/Hip Exercises: Seated   Long Arc Quad Strengthening;Right;2 sets;10 reps;Weights      Knee/Hip Exercises: Supine   Heel Slides AROM;Right;15 reps    Straight Leg Raises AAROM;Right;10 reps      Modalities   Modalities Teacher, English as a foreign language Location R lateral hip    Electrical Stimulation Action Pre-Mod    Electrical Stimulation Parameters 80-150 hz x15 min    Electrical Stimulation Goals Pain                       PT Long Term Goals - 07/13/20 1417      PT LONG TERM GOAL #1   Title Patient will be independent with HEP and its progression    Time 8    Period Weeks    Status New      PT LONG TERM GOAL #2   Title Walk in clinic 500 feet with  straight cane.    Time 8    Period Weeks    Status New      PT LONG TERM GOAL #3   Title Perform a reciprocating stair gait with one railing.    Time 8    Period Weeks    Status New      PT LONG TERM GOAL #4   Title Perform ADL's with right hip pain not > 3/10.    Time 8    Period Weeks    Status New                 Plan - 08/12/20 1213    Clinical Impression Statement Patient presented in clinic feeling better overall today. Patient able to lift RLE better but requires assist with supine SLR due to weakness. Patient able to tolerate treatment fairly well overall. Patient stands in R knee flexion due to feeling of instability and LLD feeling. In supine, RLE assessed as longer and patient worries about the compensation affecting L hip. Normal modality response noted following removal of the modality.    Personal Factors and Comorbidities Comorbidity 1;Comorbidity 2;Other    Comorbidities Bilateral TKA's, HTN.    Examination-Activity Limitations Other;Transfers;Locomotion Level;Bathing    Examination-Participation Restrictions Other    Stability/Clinical Decision Making Stable/Uncomplicated    Rehab Potential Excellent    PT Frequency 2x / week    PT Duration 8 weeks    PT Treatment/Interventions ADLs/Self Care Home Management;Cryotherapy;Electrical Stimulation;Stair training;Gait training;Functional mobility training;Therapeutic activities;Therapeutic exercise;Balance training;Neuromuscular re-education;Manual techniques;Patient/family education    PT Next Visit Plan Nustep, right LE ther ex.  GT.    Consulted and Agree with Plan of Care Patient           Patient will benefit from skilled therapeutic intervention in order to improve the following deficits and impairments:  Pain,Abnormal gait,Decreased activity tolerance,Decreased strength,Decreased range of motion  Visit Diagnosis: Pain in right hip  Muscle weakness (generalized)     Problem List Patient Active  Problem List   Diagnosis Date Noted  . Encounter for screening fecal occult blood testing 05/13/2020  . Pelvic relaxation 05/13/2020  . Encounter for well woman exam with routine gynecological exam 03/25/2018  .  Screening for colorectal cancer 03/25/2018  . Fecal occult blood test positive 03/25/2018  . Current use of estrogen therapy 03/25/2018  . Orthostatic hypotension 05/03/2017  . Syncope due to orthostatic hypotension 05/03/2017  . Symptomatic cholelithiasis 05/02/2017  . Acute cholecystitis 05/02/2017  . Abnormal nuclear stress test   . Chest pain 04/27/2017  . Hyperglycemia 04/27/2017  . Anemia 04/27/2017  . Essential hypertension 07/15/2014  . Mild persistent asthma without complication 40/81/4481  . Vitamin D deficiency 07/15/2014    Standley Brooking, PTA 08/12/2020, 12:18 PM  [facility] [address] [place], [place], [address] Phone: [phone]   Fax:  [phone]  Name: Aimie Wagman MRN: [id number] Date of Birth: 1950/10/22

## 2020-08-17 ENCOUNTER — Encounter: Payer: Self-pay | Admitting: Physical Therapy

## 2020-08-17 ENCOUNTER — Other Ambulatory Visit: Payer: Self-pay

## 2020-08-17 ENCOUNTER — Ambulatory Visit: Payer: Medicare Other | Admitting: Physical Therapy

## 2020-08-17 DIAGNOSIS — M25551 Pain in right hip: Secondary | ICD-10-CM | POA: Diagnosis not present

## 2020-08-17 DIAGNOSIS — M6281 Muscle weakness (generalized): Secondary | ICD-10-CM

## 2020-08-17 NOTE — Therapy (Signed)
[facility] [place], [place], [address] Phone: [phone]   Fax:  [phone]  Physical Therapy Treatment  Patient Details  Name: Traci Johnson MRN: [id number] Date of Birth: Jun 05, 1951 Referring Provider (PT): Edmonia Lynch MD.   Encounter Date: 08/17/2020   PT End of Session - 08/17/20 1044    Visit Number 8    Number of Visits 16    Date for PT Re-Evaluation 10/11/20    Authorization Type FOTO AT LEAST EVERY 5TH VISIT.  PROGRESS NOTE AT 10TH VISIT.  KX MODIFIER AFTER 15 VISITS.    PT Start Time 1033    PT Stop Time 1119    PT Time Calculation (min) 46 min    Equipment Utilized During Treatment Other (comment)   FWW   Activity Tolerance Patient tolerated treatment well    Behavior During Therapy WFL for tasks assessed/performed           Past Medical History:  Diagnosis Date  . Allergy   . Asthma   . Hypertension   . Migraines    cluster    Past Surgical History:  Procedure Laterality Date  . ABDOMINAL HYSTERECTOMY  1985  . CHOLECYSTECTOMY N/A 05/02/2017   Procedure: LAPAROSCOPIC CHOLECYSTECTOMY;  Surgeon: Kinsinger, Arta Bruce, MD;  Location: [facility];  Service: General;  Laterality: N/A;  . LEFT HEART CATH AND CORONARY ANGIOGRAPHY N/A 04/30/2017   Procedure: LEFT HEART CATH AND CORONARY ANGIOGRAPHY;  Surgeon: Lorretta Harp, MD;  Location: [facility];  Service: Cardiovascular;  Laterality: N/A;  . LIGAMENT REPAIR Right    birth defect  . REPLACEMENT TOTAL KNEE BILATERAL      There were no vitals filed for this visit.   Subjective Assessment - 08/17/20 1035    Subjective COVID-19 screen performed prior to patient entering clinic. Reports that she is tired but still not hurting much. Reports that MD said her body is still adjusting to new hip and in time lengths should even out. Reports just mainly uncomfortable when walking. Has canes at home but fatigues very quickly still.    Pertinent  History Bilateral TKA's, HTN.    How long can you walk comfortably? Short distances in home with a FWW.    Patient Stated Goals Get more active.  Regain strength.    Currently in Pain? Yes    Pain Score 1     Pain Location Hip    Pain Orientation Right    Pain Descriptors / Indicators Discomfort    Pain Type Surgical pain    Pain Onset More than a month ago    Pain Frequency Intermittent              [facility] PT Assessment - 08/17/20 0001      Assessment   Medical Diagnosis S/p right anterior total hip replacement.    Referring Provider (PT) Edmonia Lynch MD.    Onset Date/Surgical Date 07/08/20    Next MD Visit 09/13/2020      Precautions   Precaution Comments Anterior THA.  No ultrasound.      Restrictions   Weight Bearing Restrictions No                         [facility] Adult PT Treatment/Exercise - 08/17/20 0001      Knee/Hip Exercises: Aerobic   Nustep L4, seat 6 x15 min      Knee/Hip Exercises: Standing   Hip Abduction AROM;Both;2 sets;10 reps;Knee  straight    Lateral Step Up Right;2 sets;10 reps;Hand Hold: 2;Step Height: 6"    Forward Step Up Right;2 sets;10 reps;Hand Hold: 2;Step Height: 6"      Knee/Hip Exercises: Seated   Ball Squeeze x20 reps 3 sec holds    Clamshell with TheraBand Red   x20 reps     Knee/Hip Exercises: Supine   Bridges AROM;Both;10 reps    Straight Leg Raises AROM;Right;5 reps      Modalities   Modalities Teacher, English as a foreign language Location R lateral hip    Electrical Stimulation Action Pre-Mod    Electrical Stimulation Parameters 80-150 hz x10 min    Electrical Stimulation Goals Edema                       PT Long Term Goals - 07/13/20 1417      PT LONG TERM GOAL #1   Title Patient will be independent with HEP and its progression    Time 8    Period Weeks    Status New      PT LONG TERM GOAL #2   Title Walk in clinic 500 feet with straight cane.     Time 8    Period Weeks    Status New      PT LONG TERM GOAL #3   Title Perform a reciprocating stair gait with one railing.    Time 8    Period Weeks    Status New      PT LONG TERM GOAL #4   Title Perform ADL's with right hip pain not > 3/10.    Time 8    Period Weeks    Status New                 Plan - 08/17/20 1229    Clinical Impression Statement Patient presented in clinic with reports of very minimal pain. Patient able to tolerate progression of therex but continues to have some fatigue. Patient able to elevate into SLR independently today as well. Patient has attempted to reduce use of FWW but tires quickly. Normal stimulation response noted to help reduce inflammation.    Personal Factors and Comorbidities Comorbidity 1;Comorbidity 2;Other    Comorbidities Bilateral TKA's, HTN.    Examination-Activity Limitations Other;Transfers;Locomotion Level;Bathing    Examination-Participation Restrictions Other    Stability/Clinical Decision Making Stable/Uncomplicated    Rehab Potential Excellent    PT Frequency 2x / week    PT Duration 8 weeks    PT Treatment/Interventions ADLs/Self Care Home Management;Cryotherapy;Electrical Stimulation;Stair training;Gait training;Functional mobility training;Therapeutic activities;Therapeutic exercise;Balance training;Neuromuscular re-education;Manual techniques;Patient/family education    PT Next Visit Plan Nustep, right LE ther ex.  GT.    Consulted and Agree with Plan of Care Patient           Patient will benefit from skilled therapeutic intervention in order to improve the following deficits and impairments:  Pain,Abnormal gait,Decreased activity tolerance,Decreased strength,Decreased range of motion  Visit Diagnosis: Pain in right hip  Muscle weakness (generalized)     Problem List Patient Active Problem List   Diagnosis Date Noted  . Encounter for screening fecal occult blood testing 05/13/2020  . Pelvic  relaxation 05/13/2020  . Encounter for well woman exam with routine gynecological exam 03/25/2018  . Screening for colorectal cancer 03/25/2018  . Fecal occult blood test positive 03/25/2018  . Current use of estrogen therapy 03/25/2018  . Orthostatic hypotension 05/03/2017  .  Syncope due to orthostatic hypotension 05/03/2017  . Symptomatic cholelithiasis 05/02/2017  . Acute cholecystitis 05/02/2017  . Abnormal nuclear stress test   . Chest pain 04/27/2017  . Hyperglycemia 04/27/2017  . Anemia 04/27/2017  . Essential hypertension 07/15/2014  . Mild persistent asthma without complication 83/35/8251  . Vitamin D deficiency 07/15/2014    Standley Brooking, PTA 08/17/2020, 12:32 PM  [facility] [address] [place], [place], [address] Phone: [phone]   Fax:  [phone]  Name: Traci Johnson MRN: [id number] Date of Birth: 09-07-1950

## 2020-08-19 ENCOUNTER — Encounter: Payer: Medicare Other | Admitting: Physical Therapy

## 2020-08-20 ENCOUNTER — Encounter: Payer: Self-pay | Admitting: Physical Therapy

## 2020-08-20 ENCOUNTER — Other Ambulatory Visit: Payer: Self-pay

## 2020-08-20 ENCOUNTER — Ambulatory Visit: Payer: Medicare Other | Admitting: Physical Therapy

## 2020-08-20 DIAGNOSIS — M25551 Pain in right hip: Secondary | ICD-10-CM

## 2020-08-20 DIAGNOSIS — M6281 Muscle weakness (generalized): Secondary | ICD-10-CM

## 2020-08-20 NOTE — Therapy (Signed)
[facility] [place], [place], [address] Phone: [phone]   Fax:  [phone]  Physical Therapy Treatment  Patient Details  Name: Traci Johnson MRN: [id number] Date of Birth: [birth year] Referring Provider (PT): Edmonia Lynch MD.   Encounter Date: 08/20/2020   PT End of Session - 08/20/20 1152    Visit Number 9    Number of Visits 16    Date for PT Re-Evaluation 10/11/20    Authorization Type FOTO AT LEAST EVERY 5TH VISIT.  PROGRESS NOTE AT 10TH VISIT.  KX MODIFIER AFTER 15 VISITS.    PT Start Time 1115    PT Stop Time 1212    PT Time Calculation (min) 57 min    Equipment Utilized During Treatment Other (comment)   FWW   Activity Tolerance Patient tolerated treatment well    Behavior During Therapy WFL for tasks assessed/performed           Past Medical History:  Diagnosis Date   Allergy    Asthma    Hypertension    Migraines    cluster    Past Surgical History:  Procedure Laterality Date   ABDOMINAL HYSTERECTOMY  1985   CHOLECYSTECTOMY N/A 05/02/2017   Procedure: LAPAROSCOPIC CHOLECYSTECTOMY;  Surgeon: Mickeal Skinner, MD;  Location: [facility];  Service: General;  Laterality: N/A;   LEFT HEART CATH AND CORONARY ANGIOGRAPHY N/A 04/30/2017   Procedure: LEFT HEART CATH AND CORONARY ANGIOGRAPHY;  Surgeon: Lorretta Harp, MD;  Location: [facility];  Service: Cardiovascular;  Laterality: N/A;   LIGAMENT REPAIR Right    birth defect   REPLACEMENT TOTAL KNEE BILATERAL      There were no vitals filed for this visit.   Subjective Assessment - 08/20/20 1129    Subjective COVID-19 screen performed prior to patient entering clinic. reports that she can walk some without FWW but still feels unsteady and out of alignment.    Pertinent History Bilateral TKA's, HTN.    How long can you walk comfortably? Short distances in home with a FWW.    Patient Stated Goals Get more active.  Regain strength.     Currently in Pain? Yes    Pain Score --   "a little"   Pain Location Hip    Pain Orientation Right    Pain Descriptors / Indicators Discomfort    Pain Type Surgical pain    Pain Onset More than a month ago    Pain Frequency Intermittent              [facility] PT Assessment - 08/20/20 0001      Assessment   Medical Diagnosis S/p right anterior total hip replacement.    Referring Provider (PT) Edmonia Lynch MD.    Onset Date/Surgical Date 07/08/20    Next MD Visit 09/13/2020      Precautions   Precaution Comments Anterior THA.  No ultrasound.      Restrictions   Weight Bearing Restrictions No                         [facility] Adult PT Treatment/Exercise - 08/20/20 0001      Knee/Hip Exercises: Aerobic   Nustep L4, seat 6 x17 min      Knee/Hip Exercises: Standing   Heel Raises Both;20 reps    Knee Flexion AROM;Both;2 sets;10 reps    Hip Flexion AROM;Both;2 sets;10 reps;Knee bent    Hip Abduction AROM;Both;2 sets;10 reps;Knee straight  Functional Squat 15 reps;5 seconds      Knee/Hip Exercises: Seated   Ball Squeeze x20 reps 3 sec holds      Knee/Hip Exercises: Supine   Bridges AROM;Both;10 reps    Straight Leg Raises AROM;Right;10 reps      Knee/Hip Exercises: Sidelying   Clams RLE x20 reps   tactile blocking required to avoid hip rotation     Modalities   Modalities Electrical Stimulation      Electrical Stimulation   Electrical Stimulation Location R anterior/lateral hip    Electrical Stimulation Action pre-mod    Electrical Stimulation Parameters 80-150 hz x10 min    Electrical Stimulation Goals Edema                       PT Long Term Goals - 07/13/20 1417      PT LONG TERM GOAL #1   Title Patient will be independent with HEP and its progression    Time 8    Period Weeks    Status New      PT LONG TERM GOAL #2   Title Walk in clinic 500 feet with straight cane.    Time 8    Period Weeks    Status New      PT LONG  TERM GOAL #3   Title Perform a reciprocating stair gait with one railing.    Time 8    Period Weeks    Status New      PT LONG TERM GOAL #4   Title Perform ADL's with right hip pain not > 3/10.    Time 8    Period Weeks    Status New                 Plan - 08/20/20 1225    Clinical Impression Statement Patient presented in clinic with continued reports of leg length discrepancy in RLE. Difference is notable in standing therex as R knee flexion is required to feel equal without trendelenberg stance. Patient reporting only minimal discomfort but does not feel comfortable at this time with progression away from [facility]. Assessment of LLD provided by PTA and Mali Applegate, MPT to which she was educated to allow for more healing and then another assessment can be provided. Patient has had multiple LE surgeries in the last several years. Patient eager to return to normal ADLs such as cleaning her home independently. Normal stimulation response noted following removal of the modality.    Personal Factors and Comorbidities Comorbidity 1;Comorbidity 2;Other    Comorbidities Bilateral TKA's, HTN.    Examination-Activity Limitations Other;Transfers;Locomotion Level;Bathing    Examination-Participation Restrictions Other    Stability/Clinical Decision Making Stable/Uncomplicated    Rehab Potential Excellent    PT Frequency 2x / week    PT Duration 8 weeks    PT Treatment/Interventions ADLs/Self Care Home Management;Cryotherapy;Electrical Stimulation;Stair training;Gait training;Functional mobility training;Therapeutic activities;Therapeutic exercise;Balance training;Neuromuscular re-education;Manual techniques;Patient/family education    PT Next Visit Plan Nustep, right LE ther ex.  GT.    Consulted and Agree with Plan of Care Patient           Patient will benefit from skilled therapeutic intervention in order to improve the following deficits and impairments:  Pain,Abnormal gait,Decreased  activity tolerance,Decreased strength,Decreased range of motion  Visit Diagnosis: Pain in right hip  Muscle weakness (generalized)     Problem List Patient Active Problem List   Diagnosis Date Noted   Encounter for screening fecal occult blood testing  05/13/2020   Pelvic relaxation 05/13/2020   Encounter for well woman exam with routine gynecological exam 03/25/2018   Screening for colorectal cancer 03/25/2018   Fecal occult blood test positive 03/25/2018   Current use of estrogen therapy 03/25/2018   Orthostatic hypotension 05/03/2017   Syncope due to orthostatic hypotension 05/03/2017   Symptomatic cholelithiasis 05/02/2017   Acute cholecystitis 05/02/2017   Abnormal nuclear stress test    Chest pain 04/27/2017   Hyperglycemia 04/27/2017   Anemia 04/27/2017   Essential hypertension 07/15/2014   Mild persistent asthma without complication 00/63/4949   Vitamin D deficiency 07/15/2014    Standley Brooking, PTA 08/20/2020, 12:33 PM  [facility] [address] [place], [place], [address] Phone: [phone]   Fax:  [phone]  Name: Traci Johnson MRN: [id number] Date of Birth: 11/12/1950

## 2020-08-24 ENCOUNTER — Other Ambulatory Visit: Payer: Self-pay

## 2020-08-24 ENCOUNTER — Encounter: Payer: Self-pay | Admitting: Physical Therapy

## 2020-08-24 ENCOUNTER — Ambulatory Visit: Payer: Medicare Other | Admitting: Physical Therapy

## 2020-08-24 DIAGNOSIS — M25551 Pain in right hip: Secondary | ICD-10-CM | POA: Diagnosis not present

## 2020-08-24 DIAGNOSIS — M6281 Muscle weakness (generalized): Secondary | ICD-10-CM | POA: Diagnosis not present

## 2020-08-24 NOTE — Therapy (Addendum)
[facility] [place], [place], [address] Phone: [phone]   Fax:  [phone]  Physical Therapy Treatment Progress Note Reporting Period 07/13/2020 to 08/24/2020  See note below for Objective Data and Assessment of Progress/Goals. Patient is making slow but steady progress towards goals; today patient was able to ambulate without an AD. Gabriela Eves, PT, DPT    Patient Details  Name: Traci Johnson MRN: [id number] Date of Birth: May 17, 1951 Referring Provider (PT): Edmonia Lynch MD.   Encounter Date: 08/24/2020   PT End of Session - 08/24/20 1134    Visit Number 10    Number of Visits 16    Date for PT Re-Evaluation 10/11/20    Authorization Type FOTO AT LEAST EVERY 5TH VISIT.  PROGRESS NOTE AT 10TH VISIT.  KX MODIFIER AFTER 15 VISITS.    PT Start Time 1033    PT Stop Time 1115    PT Time Calculation (min) 42 min    Activity Tolerance Patient tolerated treatment well    Behavior During Therapy WFL for tasks assessed/performed           Past Medical History:  Diagnosis Date   Allergy    Asthma    Hypertension    Migraines    cluster    Past Surgical History:  Procedure Laterality Date   ABDOMINAL HYSTERECTOMY  1985   CHOLECYSTECTOMY N/A 05/02/2017   Procedure: LAPAROSCOPIC CHOLECYSTECTOMY;  Surgeon: Mickeal Skinner, MD;  Location: [facility];  Service: General;  Laterality: N/A;   LEFT HEART CATH AND CORONARY ANGIOGRAPHY N/A 04/30/2017   Procedure: LEFT HEART CATH AND CORONARY ANGIOGRAPHY;  Surgeon: Lorretta Harp, MD;  Location: [facility];  Service: Cardiovascular;  Laterality: N/A;   LIGAMENT REPAIR Right    birth defect   REPLACEMENT TOTAL KNEE BILATERAL      There were no vitals filed for this visit.   Subjective Assessment - 08/24/20 1125    Subjective COVID-19 screen performed prior to patient entering clinic. Found her L foot orthotic and has been using it and feel much  more confident and able to walk without AD.    Pertinent History Bilateral TKA's, HTN.    How long can you walk comfortably? Short distances in home with a FWW.    Patient Stated Goals Get more active.  Regain strength.    Currently in Pain? No/denies              [facility] PT Assessment - 08/24/20 0001      Assessment   Medical Diagnosis S/p right anterior total hip replacement.    Referring Provider (PT) Edmonia Lynch MD.    Onset Date/Surgical Date 07/08/20    Next MD Visit 09/13/2020      Precautions   Precaution Comments Anterior THA.  No ultrasound.      Restrictions   Weight Bearing Restrictions No                         [facility] Adult PT Treatment/Exercise - 08/24/20 0001      Knee/Hip Exercises: Aerobic   Nustep L4, seat 6 x17 min      Knee/Hip Exercises: Standing   Heel Raises Both;20 reps    Heel Raises Limitations toe raises x20    Hip Abduction AROM;Both;2 sets;10 reps;Knee straight    Lateral Step Up Right;2 sets;10 reps;Hand Hold: 2;Step Height: 6"    Forward Step Up Right;2 sets;10 reps;Hand Hold: 2;Step Height:  6"    Step Down Right;2 sets;10 reps;Hand Hold: 2;Step Height: 4"      Knee/Hip Exercises: Seated   Sit to Sand 20 reps;10 reps;without UE support      Knee/Hip Exercises: Supine   Bridges Strengthening;Both;20 reps    Straight Leg Raises AROM;Right;2 sets;10 reps      Knee/Hip Exercises: Sidelying   Hip ABduction Strengthening;Right;2 sets;10 reps    Clams RLE x20 reps                       PT Long Term Goals - 07/13/20 1417      PT LONG TERM GOAL #1   Title Patient will be independent with HEP and its progression    Time 8    Period Weeks    Status New      PT LONG TERM GOAL #2   Title Walk in clinic 500 feet with straight cane.    Time 8    Period Weeks    Status New      PT LONG TERM GOAL #3   Title Perform a reciprocating stair gait with one railing.    Time 8    Period Weeks    Status New       PT LONG TERM GOAL #4   Title Perform ADL's with right hip pain not > 3/10.    Time 8    Period Weeks    Status New                 Plan - 08/24/20 1223    Clinical Impression Statement Patient presented in clinic without AD and much more confident after using a L foot insert. Patient reports feeling more steady with insert in her shoe and was able to do more housework and walk more over the weekend without pain. Patient progressed to more stair training and more therex today. No deficits noted with SLR today other than reports of eccentric discomfort.    Personal Factors and Comorbidities Comorbidity 1;Comorbidity 2;Other    Comorbidities Bilateral TKA's, HTN.    Examination-Activity Limitations Other;Transfers;Locomotion Level;Bathing    Examination-Participation Restrictions Other    Stability/Clinical Decision Making Stable/Uncomplicated    Rehab Potential Excellent    PT Frequency 2x / week    PT Duration 8 weeks    PT Treatment/Interventions ADLs/Self Care Home Management;Cryotherapy;Electrical Stimulation;Stair training;Gait training;Functional mobility training;Therapeutic activities;Therapeutic exercise;Balance training;Neuromuscular re-education;Manual techniques;Patient/family education    PT Next Visit Plan Nustep, right LE ther ex.  GT.    Consulted and Agree with Plan of Care Patient           Patient will benefit from skilled therapeutic intervention in order to improve the following deficits and impairments:  Pain,Abnormal gait,Decreased activity tolerance,Decreased strength,Decreased range of motion  Visit Diagnosis: Pain in right hip  Muscle weakness (generalized)     Problem List Patient Active Problem List   Diagnosis Date Noted   Encounter for screening fecal occult blood testing 05/13/2020   Pelvic relaxation 05/13/2020   Encounter for well woman exam with routine gynecological exam 03/25/2018   Screening for colorectal cancer 03/25/2018    Fecal occult blood test positive 03/25/2018   Current use of estrogen therapy 03/25/2018   Orthostatic hypotension 05/03/2017   Syncope due to orthostatic hypotension 05/03/2017   Symptomatic cholelithiasis 05/02/2017   Acute cholecystitis 05/02/2017   Abnormal nuclear stress test    Chest pain 04/27/2017   Hyperglycemia 04/27/2017   Anemia 04/27/2017  Essential hypertension 07/15/2014   Mild persistent asthma without complication 41/42/3953   Vitamin D deficiency 07/15/2014    Standley Brooking, PTA 08/24/2020, 12:28 PM  [facility] [address] [place], [place], [address] Phone: [phone]   Fax:  [phone]  Name: Traci Johnson MRN: [id number] Date of Birth: 26-Jul-1950

## 2020-08-26 ENCOUNTER — Ambulatory Visit: Payer: Medicare Other | Admitting: Physical Therapy

## 2020-08-26 ENCOUNTER — Other Ambulatory Visit: Payer: Self-pay

## 2020-08-26 DIAGNOSIS — M25551 Pain in right hip: Secondary | ICD-10-CM | POA: Diagnosis not present

## 2020-08-26 DIAGNOSIS — M6281 Muscle weakness (generalized): Secondary | ICD-10-CM | POA: Diagnosis not present

## 2020-08-26 NOTE — Therapy (Signed)
[facility] [place], [place], [address] Phone: [phone]   Fax:  [phone]  Physical Therapy Treatment  Patient Details  Name: Traci Johnson MRN: [id number] Date of Birth: [birth year] Referring Provider (PT): Edmonia Lynch MD.   Encounter Date: 08/26/2020   PT End of Session - 08/26/20 1039    Visit Number 11    Number of Visits 16    Date for PT Re-Evaluation 10/11/20    Authorization Type FOTO score 49 11th visit.  PROGRESS NOTE AT 10TH VISIT.  KX MODIFIER AFTER 15 VISITS.    PT Start Time 1030    PT Stop Time 1114    PT Time Calculation (min) 44 min    Activity Tolerance Patient tolerated treatment well    Behavior During Therapy WFL for tasks assessed/performed           Past Medical History:  Diagnosis Date  . Allergy   . Asthma   . Hypertension   . Migraines    cluster    Past Surgical History:  Procedure Laterality Date  . ABDOMINAL HYSTERECTOMY  1985  . CHOLECYSTECTOMY N/A 05/02/2017   Procedure: LAPAROSCOPIC CHOLECYSTECTOMY;  Surgeon: Kinsinger, Arta Bruce, MD;  Location: [facility];  Service: General;  Laterality: N/A;  . LEFT HEART CATH AND CORONARY ANGIOGRAPHY N/A 04/30/2017   Procedure: LEFT HEART CATH AND CORONARY ANGIOGRAPHY;  Surgeon: Lorretta Harp, MD;  Location: [facility];  Service: Cardiovascular;  Laterality: N/A;  . LIGAMENT REPAIR Right    birth defect  . REPLACEMENT TOTAL KNEE BILATERAL      There were no vitals filed for this visit.   Subjective Assessment - 08/26/20 1036    Subjective COVID-19 screen performed prior to patient entering clinic. Patient arrived with no pain just some noted sharp pain last night which resolved.    Pertinent History Bilateral TKA's, HTN.    How long can you walk comfortably? Short distances in home with a FWW.    Patient Stated Goals Get more active.  Regain strength.    Currently in Pain? No/denies                              [facility] Adult PT Treatment/Exercise - 08/26/20 0001      Knee/Hip Exercises: Aerobic   Nustep L4, seat 6 x15 min      Knee/Hip Exercises: Standing   Heel Raises Both;20 reps    Heel Raises Limitations toe raises x20    Hip Abduction AROM;Both;2 sets;10 reps;Knee straight    Lateral Step Up Right;2 sets;10 reps;Hand Hold: 2;Step Height: 6"    Forward Step Up Right;2 sets;10 reps;Hand Hold: 2;Step Height: 6"    Step Down Limitations   unable today     Knee/Hip Exercises: Seated   Sit to Sand 20 reps;10 reps;without UE support      Knee/Hip Exercises: Supine   Bridges Strengthening;Both;20 reps    Straight Leg Raises AROM;Right;2 sets;10 reps    Other Supine Knee/Hip Exercises supine abd  with red band x20                       PT Long Term Goals - 08/26/20 1054      PT LONG TERM GOAL #1   Title Patient will be independent with HEP and its progression    Time 8    Period Weeks    Status On-going  PT LONG TERM GOAL #2   Title Walk in clinic 500 feet with straight cane.    Baseline able to walk with no assit device 08/26/20    Time 8    Period Weeks    Status Achieved      PT LONG TERM GOAL #3   Title Perform a reciprocating stair gait with one railing.    Baseline unable to perfrom 08/26/20    Time 8    Period Weeks    Status On-going      PT LONG TERM GOAL #4   Title Perform ADL's with right hip pain not > 3/10.    Baseline 08/26/20 able to perform most ADL's with little discomfort 08/26/20    Time 8    Period Weeks    Status Achieved                 Plan - 08/26/20 1114    Clinical Impression Statement Patient tolerated treatment well today. Patient reported some some fatigue today. Patient is able to ambulate with no assist device and doing well with ADL's with no pain. Patient continues to have some weakness and difficulty with step downs. Patient met LTG #3 and #4 today with others progressing.     Personal Factors and Comorbidities Comorbidity 1;Comorbidity 2;Other    Comorbidities Bilateral TKA's, HTN.    Examination-Activity Limitations Other;Transfers;Locomotion Level;Bathing    Examination-Participation Restrictions Other    Stability/Clinical Decision Making Stable/Uncomplicated    Rehab Potential Excellent    PT Frequency 2x / week    PT Duration 8 weeks    PT Treatment/Interventions ADLs/Self Care Home Management;Cryotherapy;Electrical Stimulation;Stair training;Gait training;Functional mobility training;Therapeutic activities;Therapeutic exercise;Balance training;Neuromuscular re-education;Manual techniques;Patient/family education    PT Next Visit Plan cont with POC for right LE PRE's    Consulted and Agree with Plan of Care Patient           Patient will benefit from skilled therapeutic intervention in order to improve the following deficits and impairments:  Pain,Abnormal gait,Decreased activity tolerance,Decreased strength,Decreased range of motion  Visit Diagnosis: Pain in right hip  Muscle weakness (generalized)     Problem List Patient Active Problem List   Diagnosis Date Noted  . Encounter for screening fecal occult blood testing 05/13/2020  . Pelvic relaxation 05/13/2020  . Encounter for well woman exam with routine gynecological exam 03/25/2018  . Screening for colorectal cancer 03/25/2018  . Fecal occult blood test positive 03/25/2018  . Current use of estrogen therapy 03/25/2018  . Orthostatic hypotension 05/03/2017  . Syncope due to orthostatic hypotension 05/03/2017  . Symptomatic cholelithiasis 05/02/2017  . Acute cholecystitis 05/02/2017  . Abnormal nuclear stress test   . Chest pain 04/27/2017  . Hyperglycemia 04/27/2017  . Anemia 04/27/2017  . Essential hypertension 07/15/2014  . Mild persistent asthma without complication 38/75/6433  . Vitamin D deficiency 07/15/2014    Phillips Climes, PTA 08/26/2020, 11:20 AM  [facility] [place], [place], [address] Phone: [phone]   Fax:  [phone]  Name: Traci Johnson MRN: [id number] Date of Birth: [birth year]

## 2020-08-31 ENCOUNTER — Encounter: Payer: Self-pay | Admitting: Physical Therapy

## 2020-08-31 ENCOUNTER — Ambulatory Visit: Payer: Medicare Other | Admitting: Physical Therapy

## 2020-08-31 ENCOUNTER — Other Ambulatory Visit: Payer: Self-pay

## 2020-08-31 DIAGNOSIS — M25551 Pain in right hip: Secondary | ICD-10-CM

## 2020-08-31 DIAGNOSIS — M6281 Muscle weakness (generalized): Secondary | ICD-10-CM | POA: Diagnosis not present

## 2020-08-31 NOTE — Therapy (Signed)
[facility] [place], [place], [address] Phone: [phone]   Fax:  [phone]  Physical Therapy Treatment  Patient Details  Name: Traci Johnson MRN: [id number] Date of Birth: 02-24-1951 Referring Provider (PT): Edmonia Lynch MD.   Encounter Date: 08/31/2020   PT End of Session - 08/31/20 1035    Visit Number 12    Number of Visits 16    Date for PT Re-Evaluation 10/11/20    Authorization Type FOTO score 49 11th visit.  PROGRESS NOTE AT 10TH VISIT.  KX MODIFIER AFTER 15 VISITS.    PT Start Time 1030    PT Stop Time 1115    PT Time Calculation (min) 45 min    Activity Tolerance Patient tolerated treatment well    Behavior During Therapy WFL for tasks assessed/performed           Past Medical History:  Diagnosis Date  . Allergy   . Asthma   . Hypertension   . Migraines    cluster    Past Surgical History:  Procedure Laterality Date  . ABDOMINAL HYSTERECTOMY  1985  . CHOLECYSTECTOMY N/A 05/02/2017   Procedure: LAPAROSCOPIC CHOLECYSTECTOMY;  Surgeon: Kinsinger, Arta Bruce, MD;  Location: [facility];  Service: General;  Laterality: N/A;  . LEFT HEART CATH AND CORONARY ANGIOGRAPHY N/A 04/30/2017   Procedure: LEFT HEART CATH AND CORONARY ANGIOGRAPHY;  Surgeon: Lorretta Harp, MD;  Location: [facility];  Service: Cardiovascular;  Laterality: N/A;  . LIGAMENT REPAIR Right    birth defect  . REPLACEMENT TOTAL KNEE BILATERAL      There were no vitals filed for this visit.   Subjective Assessment - 08/31/20 1035    Subjective COVID-19 screen performed prior to patient entering clinic. Patient reports feeling uncomfortable in R hip and stifness in knees and is now walking without walker.    Pertinent History Bilateral TKA's, HTN.    How long can you walk comfortably? Short distances in home with a FWW.    Patient Stated Goals Get more active.  Regain strength.    Currently in Pain? No/denies               [facility] PT Assessment - 08/31/20 0001      Assessment   Medical Diagnosis S/p right anterior total hip replacement.    Referring Provider (PT) Edmonia Lynch MD.    Onset Date/Surgical Date 07/08/20    Next MD Visit 09/13/2020      Precautions   Precaution Comments Anterior THA.  No ultrasound.      Restrictions   Weight Bearing Restrictions No                         [facility] Adult PT Treatment/Exercise - 08/31/20 0001      Knee/Hip Exercises: Aerobic   Recumbent Bike Level 3 x10 mins    Nustep L4, seat 6 x5 min      Knee/Hip Exercises: Standing   Forward Step Up Right;2 sets;10 reps;Step Height: 6";Hand Hold: 1   x10 with L hand hold, x10 with R hand hold   Step Down Right;2 sets;10 reps;Hand Hold: 2;Step Height: 2";Step Height: 4"   x10 at 2" and x10 at 4"   Other Standing Knee Exercises lateral stepping x3 round trip      Knee/Hip Exercises: Seated   Long Arc Quad Strengthening;Right;2 sets;10 reps;Weights    Long Arc Quad Weight 4 lbs.    Sit to  Sand 2 sets;10 reps;without UE support   4# weight                      PT Long Term Goals - 08/26/20 1054      PT LONG TERM GOAL #1   Title Patient will be independent with HEP and its progression    Time 8    Period Weeks    Status On-going      PT LONG TERM GOAL #2   Title Walk in clinic 500 feet with straight cane.    Baseline able to walk with no assit device 08/26/20    Time 8    Period Weeks    Status Achieved      PT LONG TERM GOAL #3   Title Perform a reciprocating stair gait with one railing.    Baseline unable to perfrom 08/26/20    Time 8    Period Weeks    Status On-going      PT LONG TERM GOAL #4   Title Perform ADL's with right hip pain not > 3/10.    Baseline 08/26/20 able to perform most ADL's with little discomfort 08/26/20    Time 8    Period Weeks    Status Achieved                 Plan - 08/31/20 1216    Clinical Impression Statement Patient was able  to tolerate treatment well with progression of step downs. Patient reports some fear of falling especially because she has a history of her right LE buckling and she is afraid she will fall down the steps. Patient instructed more stair training and negotiation to build confidence and balance. Patient reported understanding. Patient did well with balance beam lateral stepping with intermittent UE support to maintain balance.    Personal Factors and Comorbidities Comorbidity 1;Comorbidity 2;Other    Comorbidities Bilateral TKA's, HTN.    Examination-Activity Limitations Other;Transfers;Locomotion Level;Bathing    Examination-Participation Restrictions Other    Stability/Clinical Decision Making Stable/Uncomplicated    Clinical Decision Making Low    Rehab Potential Excellent    PT Frequency 2x / week    PT Duration 8 weeks    PT Treatment/Interventions ADLs/Self Care Home Management;Cryotherapy;Electrical Stimulation;Stair training;Gait training;Functional mobility training;Therapeutic activities;Therapeutic exercise;Balance training;Neuromuscular re-education;Manual techniques;Patient/family education    PT Next Visit Plan cont with POC for right LE PRE's    Consulted and Agree with Plan of Care Patient           Patient will benefit from skilled therapeutic intervention in order to improve the following deficits and impairments:  Pain,Abnormal gait,Decreased activity tolerance,Decreased strength,Decreased range of motion  Visit Diagnosis: Pain in right hip  Muscle weakness (generalized)     Problem List Patient Active Problem List   Diagnosis Date Noted  . Encounter for screening fecal occult blood testing 05/13/2020  . Pelvic relaxation 05/13/2020  . Encounter for well woman exam with routine gynecological exam 03/25/2018  . Screening for colorectal cancer 03/25/2018  . Fecal occult blood test positive 03/25/2018  . Current use of estrogen therapy 03/25/2018  . Orthostatic  hypotension 05/03/2017  . Syncope due to orthostatic hypotension 05/03/2017  . Symptomatic cholelithiasis 05/02/2017  . Acute cholecystitis 05/02/2017  . Abnormal nuclear stress test   . Chest pain 04/27/2017  . Hyperglycemia 04/27/2017  . Anemia 04/27/2017  . Essential hypertension 07/15/2014  . Mild persistent asthma without complication 25/85/2778  . Vitamin D deficiency 07/15/2014  Gabriela Eves, PT, DPT 08/31/2020, 12:26 PM  [facility] [address] [place], [place], [address] Phone: [phone]   Fax:  [phone]  Name: Mikenna Bunkley MRN: [id number] Date of Birth: 1950/06/22

## 2020-09-02 ENCOUNTER — Encounter: Payer: Self-pay | Admitting: Physical Therapy

## 2020-09-02 ENCOUNTER — Other Ambulatory Visit: Payer: Self-pay

## 2020-09-02 ENCOUNTER — Ambulatory Visit: Payer: Medicare Other | Admitting: Physical Therapy

## 2020-09-02 DIAGNOSIS — M25551 Pain in right hip: Secondary | ICD-10-CM

## 2020-09-02 DIAGNOSIS — M6281 Muscle weakness (generalized): Secondary | ICD-10-CM | POA: Diagnosis not present

## 2020-09-02 NOTE — Therapy (Signed)
[facility] [place], [place], [place], [address] Phone: [phone]   Fax:  [phone]  Physical Therapy Treatment  Patient Details  Name: Traci Johnson MRN: [id number] Date of Birth: 1951-03-30 Referring Provider (PT): Edmonia Lynch MD.   Encounter Date: 09/02/2020   PT End of Session - 09/02/20 1043    Visit Number 13    Number of Visits 16    Date for PT Re-Evaluation 10/11/20    Authorization Type FOTO score 49 11th visit.  PROGRESS NOTE AT 10TH VISIT.  KX MODIFIER AFTER 15 VISITS.    PT Start Time 1030    PT Stop Time 1115    PT Time Calculation (min) 45 min    Activity Tolerance Patient tolerated treatment well    Behavior During Therapy WFL for tasks assessed/performed           Past Medical History:  Diagnosis Date  . Allergy   . Asthma   . Hypertension   . Migraines    cluster    Past Surgical History:  Procedure Laterality Date  . ABDOMINAL HYSTERECTOMY  1985  . CHOLECYSTECTOMY N/A 05/02/2017   Procedure: LAPAROSCOPIC CHOLECYSTECTOMY;  Surgeon: Kinsinger, Arta Bruce, MD;  Location: [facility];  Service: General;  Laterality: N/A;  . LEFT HEART CATH AND CORONARY ANGIOGRAPHY N/A 04/30/2017   Procedure: LEFT HEART CATH AND CORONARY ANGIOGRAPHY;  Surgeon: Lorretta Harp, MD;  Location: [facility];  Service: Cardiovascular;  Laterality: N/A;  . LIGAMENT REPAIR Right    birth defect  . REPLACEMENT TOTAL KNEE BILATERAL      There were no vitals filed for this visit.   Subjective Assessment - 09/02/20 1040    Subjective COVID-19 screen performed prior to patient entering clinic. Patient arrives doing well some catching in R knee but doing well.    Pertinent History Bilateral TKA's, HTN.    How long can you walk comfortably? Short distances in home with a FWW.    Patient Stated Goals Get more active.  Regain strength.    Currently in Pain? No/denies              [facility] PT Assessment - 09/02/20  0001      Assessment   Medical Diagnosis S/p right anterior total hip replacement.    Referring Provider (PT) Edmonia Lynch MD.    Onset Date/Surgical Date 07/08/20    Next MD Visit 09/13/2020      Precautions   Precaution Comments Anterior THA.  No ultrasound.      Restrictions   Weight Bearing Restrictions No                         [facility] Adult PT Treatment/Exercise - 09/02/20 0001      Knee/Hip Exercises: Aerobic   Recumbent Bike Level 3 x15 mins      Knee/Hip Exercises: Standing   Lateral Step Up 2 sets;10 reps;Step Height: 6";Both;Hand Hold: 1   x10 L UE, x 10 R UE support   Forward Step Up Right;2 sets;10 reps;Step Height: 6";Hand Hold: 1   x10 with L UE and x10 with R UE support   Step Down Right;2 sets;10 reps;Hand Hold: 2;Step Height: 4"    Other Standing Knee Exercises balance beam lateral stepping x3 minutes with intermittent UE support      Knee/Hip Exercises: Seated   Long Arc Quad Strengthening;Right;3 sets;10 reps;Weights    Long Arc Quad Weight 4 lbs.  Marching Strengthening;2 sets;10 reps;Weights    Marching Limitations 2    Hamstring Curl Strengthening;2 sets;10 reps    Hamstring Limitations red theraband    Sit to Sand 2 sets;10 reps;without UE support   staggered R stance x10; staggered R stance with red theraband for hip abd activation x10                      PT Long Term Goals - 08/26/20 1054      PT LONG TERM GOAL #1   Title Patient will be independent with HEP and its progression    Time 8    Period Weeks    Status On-going      PT LONG TERM GOAL #2   Title Walk in clinic 500 feet with straight cane.    Baseline able to walk with no assit device 08/26/20    Time 8    Period Weeks    Status Achieved      PT LONG TERM GOAL #3   Title Perform a reciprocating stair gait with one railing.    Baseline unable to perfrom 08/26/20    Time 8    Period Weeks    Status On-going      PT LONG TERM GOAL #4   Title  Perform ADL's with right hip pain not > 3/10.    Baseline 08/26/20 able to perform most ADL's with little discomfort 08/26/20    Time 8    Period Weeks    Status Achieved                 Plan - 09/02/20 1515    Clinical Impression Statement Patient arrives to physical therapy with some reports of right knee pain but overall fairly well with right hip. Patient guided through standing activities with but with reports of fatigue. Patient provided with intermitent rest breaks. Patient with less hesitation with step down but still requires tactile cue of her R heel on the step as she steps down. Patient with mild fasciculations with LAQ but overall responded well.    Personal Factors and Comorbidities Comorbidity 1;Comorbidity 2;Other    Comorbidities Bilateral TKA's, HTN.    Examination-Activity Limitations Other;Transfers;Locomotion Level;Bathing    Examination-Participation Restrictions Other    Stability/Clinical Decision Making Stable/Uncomplicated    Clinical Decision Making Low    Rehab Potential Excellent    PT Frequency 2x / week    PT Duration 8 weeks    PT Treatment/Interventions ADLs/Self Care Home Management;Cryotherapy;Electrical Stimulation;Stair training;Gait training;Functional mobility training;Therapeutic activities;Therapeutic exercise;Balance training;Neuromuscular re-education;Manual techniques;Patient/family education    PT Next Visit Plan cont with POC for right LE PRE's    Consulted and Agree with Plan of Care Patient           Patient will benefit from skilled therapeutic intervention in order to improve the following deficits and impairments:  Pain,Abnormal gait,Decreased activity tolerance,Decreased strength,Decreased range of motion  Visit Diagnosis: Pain in right hip  Muscle weakness (generalized)     Problem List Patient Active Problem List   Diagnosis Date Noted  . Encounter for screening fecal occult blood testing 05/13/2020  . Pelvic  relaxation 05/13/2020  . Encounter for well woman exam with routine gynecological exam 03/25/2018  . Screening for colorectal cancer 03/25/2018  . Fecal occult blood test positive 03/25/2018  . Current use of estrogen therapy 03/25/2018  . Orthostatic hypotension 05/03/2017  . Syncope due to orthostatic hypotension 05/03/2017  . Symptomatic cholelithiasis 05/02/2017  . Acute  cholecystitis 05/02/2017  . Abnormal nuclear stress test   . Chest pain 04/27/2017  . Hyperglycemia 04/27/2017  . Anemia 04/27/2017  . Essential hypertension 07/15/2014  . Mild persistent asthma without complication 37/90/2409  . Vitamin D deficiency 07/15/2014    Gabriela Eves, PT, DPT 09/02/2020, 3:31 PM  [facility] [address] [place], [place], [address] Phone: [phone]   Fax:  [phone]  Name: Traci Johnson MRN: [id number] Date of Birth: [birth year]

## 2020-09-07 ENCOUNTER — Encounter: Payer: Self-pay | Admitting: Physical Therapy

## 2020-09-07 ENCOUNTER — Ambulatory Visit: Payer: Medicare Other | Attending: Orthopedic Surgery | Admitting: Physical Therapy

## 2020-09-07 ENCOUNTER — Other Ambulatory Visit: Payer: Self-pay

## 2020-09-07 DIAGNOSIS — M25551 Pain in right hip: Secondary | ICD-10-CM | POA: Insufficient documentation

## 2020-09-07 DIAGNOSIS — M6281 Muscle weakness (generalized): Secondary | ICD-10-CM | POA: Diagnosis not present

## 2020-09-07 NOTE — Therapy (Signed)
[facility] [place], [place], [address] Phone: [phone]   Fax:  [phone]  Physical Therapy Treatment  Patient Details  Name: Traci Johnson MRN: [id number] Date of Birth: 07-02-1950 Referring Provider (PT): Edmonia Lynch MD.   Encounter Date: 09/07/2020   PT End of Session - 09/07/20 1044    Visit Number 14    Number of Visits 16    Date for PT Re-Evaluation 10/11/20    Authorization Type FOTO score 49 11th visit.  PROGRESS NOTE AT 10TH VISIT.  KX MODIFIER AFTER 15 VISITS.    PT Start Time 1030    PT Stop Time 1116    PT Time Calculation (min) 46 min    Activity Tolerance Patient tolerated treatment well    Behavior During Therapy WFL for tasks assessed/performed           Past Medical History:  Diagnosis Date  . Allergy   . Asthma   . Hypertension   . Migraines    cluster    Past Surgical History:  Procedure Laterality Date  . ABDOMINAL HYSTERECTOMY  1985  . CHOLECYSTECTOMY N/A 05/02/2017   Procedure: LAPAROSCOPIC CHOLECYSTECTOMY;  Surgeon: Kinsinger, Arta Bruce, MD;  Location: [facility];  Service: General;  Laterality: N/A;  . LEFT HEART CATH AND CORONARY ANGIOGRAPHY N/A 04/30/2017   Procedure: LEFT HEART CATH AND CORONARY ANGIOGRAPHY;  Surgeon: Lorretta Harp, MD;  Location: [place];  Service: Cardiovascular;  Laterality: N/A;  . LIGAMENT REPAIR Right    birth defect  . REPLACEMENT TOTAL KNEE BILATERAL      There were no vitals filed for this visit.   Subjective Assessment - 09/07/20 1036    Subjective COVID-19 screen performed prior to patient entering clinic. Patient reports hip feels good, knees are a little stiff. Patient reports she went down the basement steps over the weekend and felt alright going up with the right but cautious.    Pertinent History Bilateral TKA's, HTN.    How long can you walk comfortably? Short distances in home with a FWW.    Patient Stated Goals Get more  active.  Regain strength.    Currently in Pain? No/denies              [facility] PT Assessment - 09/07/20 0001      Assessment   Medical Diagnosis S/p right anterior total hip replacement.    Referring Provider (PT) Edmonia Lynch MD.    Onset Date/Surgical Date 07/08/20    Next MD Visit 09/13/2020      Precautions   Precaution Comments Anterior THA.  No ultrasound.      Restrictions   Weight Bearing Restrictions No                         [facility] Adult PT Treatment/Exercise - 09/07/20 0001      Knee/Hip Exercises: Aerobic   Recumbent Bike Level 3 x10 mins    Nustep L6, seat 6 x5 min      Knee/Hip Exercises: Machines for Strengthening   Cybex Leg Press 1 plate 2x10      Knee/Hip Exercises: Standing   Step Down Right;2 sets;10 reps;Hand Hold: 2;Step Height: 4"    Other Standing Knee Exercises balance beam lateral stepping x3 minutes with intermittent UE support      Knee/Hip Exercises: Seated   Long Arc Quad Strengthening;Right;3 sets;10 reps;Weights    Long Arc Quad Weight 4 lbs.  Hamstring Curl Strengthening;2 sets;10 reps    Hamstring Limitations green theraband    Sit to Sand 2 sets;10 reps;without UE support                       PT Long Term Goals - 08/26/20 1054      PT LONG TERM GOAL #1   Title Patient will be independent with HEP and its progression    Time 8    Period Weeks    Status On-going      PT LONG TERM GOAL #2   Title Walk in clinic 500 feet with straight cane.    Baseline able to walk with no assit device 08/26/20    Time 8    Period Weeks    Status Achieved      PT LONG TERM GOAL #3   Title Perform a reciprocating stair gait with one railing.    Baseline unable to perfrom 08/26/20    Time 8    Period Weeks    Status On-going      PT LONG TERM GOAL #4   Title Perform ADL's with right hip pain not > 3/10.    Baseline 08/26/20 able to perform most ADL's with little discomfort 08/26/20    Time 8    Period Weeks     Status Achieved                 Plan - 09/07/20 1303    Clinical Impression Statement Patient responded well to therapy session with the progression to leg press. Patient provided with verbal cuing to prevent full knee extension on leg press. Patient guided through step downs with improved form. Intermittent rest breaks provided secondary to fatigue.    Personal Factors and Comorbidities Comorbidity 1;Comorbidity 2;Other    Comorbidities Bilateral TKA's, HTN.    Examination-Activity Limitations Other;Transfers;Locomotion Level;Bathing    Examination-Participation Restrictions Other    Stability/Clinical Decision Making Stable/Uncomplicated    Clinical Decision Making Low    Rehab Potential Excellent    PT Frequency 2x / week    PT Duration 8 weeks    PT Treatment/Interventions ADLs/Self Care Home Management;Cryotherapy;Electrical Stimulation;Stair training;Gait training;Functional mobility training;Therapeutic activities;Therapeutic exercise;Balance training;Neuromuscular re-education;Manual techniques;Patient/family education    PT Next Visit Plan cont with POC for right LE PRE's    Consulted and Agree with Plan of Care Patient           Patient will benefit from skilled therapeutic intervention in order to improve the following deficits and impairments:  Pain,Abnormal gait,Decreased activity tolerance,Decreased strength,Decreased range of motion  Visit Diagnosis: Pain in right hip  Muscle weakness (generalized)     Problem List Patient Active Problem List   Diagnosis Date Noted  . Encounter for screening fecal occult blood testing 05/13/2020  . Pelvic relaxation 05/13/2020  . Encounter for well woman exam with routine gynecological exam 03/25/2018  . Screening for colorectal cancer 03/25/2018  . Fecal occult blood test positive 03/25/2018  . Current use of estrogen therapy 03/25/2018  . Orthostatic hypotension 05/03/2017  . Syncope due to orthostatic hypotension  05/03/2017  . Symptomatic cholelithiasis 05/02/2017  . Acute cholecystitis 05/02/2017  . Abnormal nuclear stress test   . Chest pain 04/27/2017  . Hyperglycemia 04/27/2017  . Anemia 04/27/2017  . Essential hypertension 07/15/2014  . Mild persistent asthma without complication 38/18/2993  . Vitamin D deficiency 07/15/2014    Gabriela Eves, PT, DPT 09/07/2020, 3:18 PM  [facility] [address]  [place], [place], [address] Phone: [phone]   Fax:  [phone]  Name: Traci Johnson MRN: [id number] Date of Birth: 1950-11-22

## 2020-09-09 ENCOUNTER — Ambulatory Visit: Payer: Medicare Other | Admitting: Physical Therapy

## 2020-09-09 ENCOUNTER — Other Ambulatory Visit: Payer: Self-pay

## 2020-09-09 DIAGNOSIS — M6281 Muscle weakness (generalized): Secondary | ICD-10-CM | POA: Diagnosis not present

## 2020-09-09 DIAGNOSIS — M25551 Pain in right hip: Secondary | ICD-10-CM | POA: Diagnosis not present

## 2020-09-09 NOTE — Therapy (Addendum)
[facility] [place], [place], [address] Phone: [phone]   Fax:  [phone]  Physical Therapy Treatment  Patient Details  Name: Traci Johnson MRN: [id number] Date of Birth: [birth year] Referring Provider (PT): Edmonia Lynch MD.   Encounter Date: 09/09/2020   PT End of Session - 09/09/20 1049    Visit Number 15    Number of Visits 16    Date for PT Re-Evaluation 10/11/20    Authorization Type FOTO score 49 11th visit.  PROGRESS NOTE AT 10TH VISIT.  KX MODIFIER AFTER 15 VISITS.    PT Start Time 1030    PT Stop Time 1116    PT Time Calculation (min) 46 min    Equipment Utilized During Treatment Other (comment)    Activity Tolerance Patient tolerated treatment well    Behavior During Therapy WFL for tasks assessed/performed           Past Medical History:  Diagnosis Date  . Allergy   . Asthma   . Hypertension   . Migraines    cluster    Past Surgical History:  Procedure Laterality Date  . ABDOMINAL HYSTERECTOMY  1985  . CHOLECYSTECTOMY N/A 05/02/2017   Procedure: LAPAROSCOPIC CHOLECYSTECTOMY;  Surgeon: Kinsinger, Arta Bruce, MD;  Location: [facility];  Service: General;  Laterality: N/A;  . LEFT HEART CATH AND CORONARY ANGIOGRAPHY N/A 04/30/2017   Procedure: LEFT HEART CATH AND CORONARY ANGIOGRAPHY;  Surgeon: Lorretta Harp, MD;  Location: [facility];  Service: Cardiovascular;  Laterality: N/A;  . LIGAMENT REPAIR Right    birth defect  . REPLACEMENT TOTAL KNEE BILATERAL      There were no vitals filed for this visit.   Subjective Assessment - 09/09/20 1052    Subjective COVID-19 screen performed prior to patient entering clinic. Patient arrived doing fairly well. States she went shopping yesterday and did well.    Pertinent History Bilateral TKA's, HTN.    How long can you walk comfortably? Short distances in home with a FWW.    Patient Stated Goals Get more active.  Regain strength.    Currently  in Pain? No/denies              [facility] PT Assessment - 09/09/20 0001      Assessment   Medical Diagnosis S/p right anterior total hip replacement.    Referring Provider (PT) Edmonia Lynch MD.    Onset Date/Surgical Date 07/08/20    Next MD Visit 09/13/2020      Precautions   Precaution Comments Anterior THA.  No ultrasound.      Restrictions   Weight Bearing Restrictions No                         [facility] Adult PT Treatment/Exercise - 09/09/20 0001      Knee/Hip Exercises: Aerobic   Recumbent Bike Level 3 x10 mins    Nustep L6, seat 6 x5 min      Knee/Hip Exercises: Machines for Strengthening   Cybex Knee Extension 10# 2x10    Cybex Knee Flexion 30# 2x10    Cybex Leg Press 1 plate 2x10      Knee/Hip Exercises: Standing   SLS R SLS x1 minute with interemittent UE support    Other Standing Knee Exercises balance beam lateral stepping x3 minutes with intermittent UE support; balance beam lateral stepping x3 minutes  PT Long Term Goals - 09/09/20 1110      PT LONG TERM GOAL #1   Title Patient will be independent with HEP and its progression    Time 8    Period Weeks    Status Achieved      PT LONG TERM GOAL #2   Title Walk in clinic 500 feet with straight cane.    Period Weeks    Status Achieved      PT LONG TERM GOAL #3   Title Perform a reciprocating stair gait with one railing.    Time 8    Period Weeks    Status Partially Met   reciprocating going up; step to step going down     PT LONG TERM GOAL #4   Title Perform ADL's with right hip pain not > 3/10.    Time 8    Status Achieved                 Plan - 09/09/20 1117    Clinical Impression Statement Patient responed well to therapy session with progression of LE machines. Patient demonstrated improved technique indicating strong carryover of cuing from last visit. Balance activities performed with good response though with fatigue and tightness in R  hip.  Patient's stair goal is partially met. Patient to see MD for follow up.    Personal Factors and Comorbidities Comorbidity 1;Comorbidity 2;Other    Comorbidities Bilateral TKA's, HTN.    Examination-Activity Limitations Other;Transfers;Locomotion Level;Bathing    Examination-Participation Restrictions Other    Stability/Clinical Decision Making Stable/Uncomplicated    Clinical Decision Making Low    Rehab Potential Excellent    PT Frequency 2x / week    PT Duration 8 weeks    PT Treatment/Interventions ADLs/Self Care Home Management;Cryotherapy;Electrical Stimulation;Stair training;Gait training;Functional mobility training;Therapeutic activities;Therapeutic exercise;Balance training;Neuromuscular re-education;Manual techniques;Patient/family education    PT Next Visit Plan cont or DC pending MD follow up    Consulted and Agree with Plan of Care Patient           Patient will benefit from skilled therapeutic intervention in order to improve the following deficits and impairments:  Pain,Abnormal gait,Decreased activity tolerance,Decreased strength,Decreased range of motion  Visit Diagnosis: Pain in right hip  Muscle weakness (generalized)     Problem List Patient Active Problem List   Diagnosis Date Noted  . Encounter for screening fecal occult blood testing 05/13/2020  . Pelvic relaxation 05/13/2020  . Encounter for well woman exam with routine gynecological exam 03/25/2018  . Screening for colorectal cancer 03/25/2018  . Fecal occult blood test positive 03/25/2018  . Current use of estrogen therapy 03/25/2018  . Orthostatic hypotension 05/03/2017  . Syncope due to orthostatic hypotension 05/03/2017  . Symptomatic cholelithiasis 05/02/2017  . Acute cholecystitis 05/02/2017  . Abnormal nuclear stress test   . Chest pain 04/27/2017  . Hyperglycemia 04/27/2017  . Anemia 04/27/2017  . Essential hypertension 07/15/2014  . Mild persistent asthma without complication  19/62/2297  . Vitamin D deficiency 07/15/2014    Gabriela Eves, PT, DPT 09/09/2020, 11:20 AM  [facility] [address] [place], [place], [address] Phone: [phone]   Fax:  [phone]  Name: Traci Johnson MRN: [id number] Date of Birth: 11-18-1950  PHYSICAL THERAPY DISCHARGE SUMMARY  Visits from Start of Care: 15  Current functional level related to goals / functional outcomes: See above   Remaining deficits: See above   Education / Equipment: HEP Plan: Patient agrees to discharge.  Patient goals were  partially met. Patient is being discharged due to being pleased with the current functional level.  ?????     Madelyn Flavors, PT 11/09/20 7:38 PM [facility] [place], [place], [address] Phone: [phone]   Fax:  [phone]

## 2020-09-10 ENCOUNTER — Other Ambulatory Visit (HOSPITAL_COMMUNITY): Payer: Self-pay | Admitting: Adult Health

## 2020-09-10 DIAGNOSIS — Z1231 Encounter for screening mammogram for malignant neoplasm of breast: Secondary | ICD-10-CM

## 2020-09-13 DIAGNOSIS — M1611 Unilateral primary osteoarthritis, right hip: Secondary | ICD-10-CM | POA: Diagnosis not present

## 2020-09-21 DIAGNOSIS — E559 Vitamin D deficiency, unspecified: Secondary | ICD-10-CM | POA: Diagnosis not present

## 2020-09-21 DIAGNOSIS — R5382 Chronic fatigue, unspecified: Secondary | ICD-10-CM | POA: Diagnosis not present

## 2020-10-13 ENCOUNTER — Other Ambulatory Visit: Payer: Self-pay

## 2020-10-13 ENCOUNTER — Ambulatory Visit (HOSPITAL_COMMUNITY)
Admission: RE | Admit: 2020-10-13 | Discharge: 2020-10-13 | Disposition: A | Discharge status: Home / Self Care | Payer: Medicare Other | Source: Ambulatory Visit | Attending: Adult Health | Admitting: Adult Health

## 2020-10-13 DIAGNOSIS — Z1231 Encounter for screening mammogram for malignant neoplasm of breast: Secondary | ICD-10-CM | POA: Diagnosis not present

## 2020-10-15 DIAGNOSIS — M1611 Unilateral primary osteoarthritis, right hip: Secondary | ICD-10-CM | POA: Diagnosis not present

## 2020-10-18 ENCOUNTER — Other Ambulatory Visit: Payer: Self-pay | Admitting: Orthopedic Surgery

## 2020-10-18 DIAGNOSIS — M79604 Pain in right leg: Secondary | ICD-10-CM

## 2020-10-18 DIAGNOSIS — M79605 Pain in left leg: Secondary | ICD-10-CM

## 2020-10-25 DIAGNOSIS — R5382 Chronic fatigue, unspecified: Secondary | ICD-10-CM | POA: Diagnosis not present

## 2020-10-25 DIAGNOSIS — E559 Vitamin D deficiency, unspecified: Secondary | ICD-10-CM | POA: Diagnosis not present

## 2020-10-25 DIAGNOSIS — E215 Disorder of parathyroid gland, unspecified: Secondary | ICD-10-CM | POA: Diagnosis not present

## 2020-10-29 ENCOUNTER — Ambulatory Visit
Admission: RE | Admit: 2020-10-29 | Discharge: 2020-10-29 | Disposition: A | Discharge status: Home / Self Care | Payer: Medicare Other | Source: Ambulatory Visit | Attending: Orthopedic Surgery | Admitting: Orthopedic Surgery

## 2020-10-29 ENCOUNTER — Other Ambulatory Visit: Payer: Self-pay

## 2020-10-29 DIAGNOSIS — Z96642 Presence of left artificial hip joint: Secondary | ICD-10-CM | POA: Diagnosis not present

## 2020-10-29 DIAGNOSIS — Z96641 Presence of right artificial hip joint: Secondary | ICD-10-CM | POA: Diagnosis not present

## 2020-10-29 DIAGNOSIS — Z471 Aftercare following joint replacement surgery: Secondary | ICD-10-CM | POA: Diagnosis not present

## 2020-10-29 DIAGNOSIS — M79605 Pain in left leg: Secondary | ICD-10-CM

## 2020-10-29 DIAGNOSIS — M79604 Pain in right leg: Secondary | ICD-10-CM

## 2020-11-03 DIAGNOSIS — M1611 Unilateral primary osteoarthritis, right hip: Secondary | ICD-10-CM | POA: Diagnosis not present

## 2020-11-09 ENCOUNTER — Other Ambulatory Visit: Payer: Self-pay

## 2020-11-09 ENCOUNTER — Ambulatory Visit: Payer: Medicare Other | Attending: Orthopedic Surgery | Admitting: Physical Therapy

## 2020-11-09 ENCOUNTER — Encounter: Payer: Self-pay | Admitting: Physical Therapy

## 2020-11-09 DIAGNOSIS — M25551 Pain in right hip: Secondary | ICD-10-CM | POA: Diagnosis not present

## 2020-11-09 DIAGNOSIS — M6281 Muscle weakness (generalized): Secondary | ICD-10-CM

## 2020-11-09 DIAGNOSIS — R2689 Other abnormalities of gait and mobility: Secondary | ICD-10-CM

## 2020-11-09 DIAGNOSIS — R2681 Unsteadiness on feet: Secondary | ICD-10-CM | POA: Insufficient documentation

## 2020-11-09 NOTE — Patient Instructions (Signed)
Access Code: [email or web address] URL: [email or web address] Date: 11/09/2020 Prepared by: Almyra Free  Exercises Supine Hamstring Stretch with Strap - 2 x daily - 7 x weekly - 1 sets - 3 reps - 60 sec hold Supine Bridge - 2 x daily - 7 x weekly - 1-3 sets - 10 reps Modified Thomas Stretch - 1 x daily - 7 x weekly - 1 sets - 3 reps - 60 sec hold

## 2020-11-09 NOTE — Therapy (Signed)
[facility] [place], [place], [address] Phone: [phone]   Fax:  [phone]  Physical Therapy Evaluation  Patient Details  Name: Traci Johnson MRN: [id number] Date of Birth: 08-06-1950 Referring Provider (PT): Edmonia Lynch MD.   Encounter Date: 11/09/2020   PT End of Session - 11/09/20 1124    Visit Number 1    Number of Visits 12    Date for PT Re-Evaluation 12/21/20    Authorization Type KX MODIFIER (Patient has already been see 15 visit)    PT Start Time 1125    PT Stop Time 1211    PT Time Calculation (min) 46 min    Activity Tolerance Patient tolerated treatment well    Behavior During Therapy [facility] for tasks assessed/performed           Past Medical History:  Diagnosis Date  . Allergy   . Asthma   . Hypertension   . Migraines    cluster    Past Surgical History:  Procedure Laterality Date  . ABDOMINAL HYSTERECTOMY  1985  . CHOLECYSTECTOMY N/A 05/02/2017   Procedure: LAPAROSCOPIC CHOLECYSTECTOMY;  Surgeon: Kinsinger, Arta Bruce, MD;  Location: [facility];  Service: General;  Laterality: N/A;  . LEFT HEART CATH AND CORONARY ANGIOGRAPHY N/A 04/30/2017   Procedure: LEFT HEART CATH AND CORONARY ANGIOGRAPHY;  Surgeon: Lorretta Harp, MD;  Location: [facility];  Service: Cardiovascular;  Laterality: N/A;  . LIGAMENT REPAIR Right    birth defect  . REPLACEMENT TOTAL KNEE BILATERAL      There were no vitals filed for this visit.    Subjective Assessment - 11/09/20 1127    Subjective Patient is familiar to this clinic and finished her last episode of care 09/09/20 at which time she reports improvements s/p her Rt THA. she was still having gait abnomalities and pain. She had a CT scan which showed a leg length discrepancy. She had her left shoe built up by [employer] 1 inch, but it was so heavy it was making her hurt worse. Dr. Percell Miller advised an insert in her left shoe instead. Patient has pain in her back  and hips. She is very stiff in her knees and hips. She also walks with a limp. She reports two falls in the past few weeks and reports feeling unstable.    Pertinent History Bilateral TKA's, HTN, right THA, chronic fatigue    How long can you stand comfortably? not at all; uses a stool to cook, grooming    Diagnostic tests CT  Rt leg 77.9 cm; Lt 75.8 cm    Patient Stated Goals Get more active.  Regain strength.    Currently in Pain? No/denies              [facility] PT Assessment - 11/09/20 0001      Assessment   Medical Diagnosis pelvic stability and gait    Referring Provider (PT) Edmonia Lynch MD.    Onset Date/Surgical Date 07/08/20    Next MD Visit July 13    Prior Therapy yes      Precautions   Precautions Fall      Restrictions   Weight Bearing Restrictions No      Balance Screen   Has the patient fallen in the past 6 months Yes    How many times? 2    Has the patient had a decrease in activity level because of a fear of falling?  Yes    Is the patient  reluctant to leave their home because of a fear of falling?  No      Home Ecologist residence      Prior Function   Level of Independence Independent    Vocation Retired    Leisure would like to walk for exercise      Observation/Other Assessments   Observations When standing barefoot patient must flex her right hip and knee which causes right SB of trunk; with shoe insert pelvic landmarks are WNL      Functional Tests   Functional tests Single leg stance      Single Leg Stance   Comments < 3 seconds bil; tandem stance <3 sec bil      AROM   Overall AROM Comments lumbar and bil hips/knees WFL; some tightness in bil ankle DF      Strength   Strength Assessment Site Hip    Right/Left Hip Right;Left    Right Hip Flexion 4-/5    Right Hip Extension 4/5    Right Hip ABduction 4+/5    Left Hip Flexion 4+/5    Left Hip Extension 4/5    Left Hip ABduction 5/5      Flexibility    Soft Tissue Assessment /Muscle Length yes    Hamstrings tight right    Quadriceps tight right in prone and hip flexor tight also    ITB WNL    Piriformis WNL    Quadratus Lumborum mild tightness right      Palpation   Palpation comment unremarkable      Ambulation/Gait   Gait Comments Amb with decreased stance time right and decreased step length bil; pt demo's marked pronation of right foot with stance phase                      Objective measurements completed on examination: See above findings.               PT Education - 11/09/20 1604    Education Details HEP; advised keeping in shoe insert to help with gait.    Person(s) Educated Patient    Methods Explanation;Demonstration;Handout    Comprehension Verbalized understanding;Returned demonstration               PT Long Term Goals - 11/09/20 1950      PT LONG TERM GOAL #1   Title Patient will be independent with HEP and its progression    Time 6    Period Weeks    Status New    Target Date 12/21/20      PT LONG TERM GOAL #2   Title Patient to demonstrate improved step and stride length with ambulation with decreased pain by 50%.    Time 6    Period Weeks    Status New      PT LONG TERM GOAL #3   Title Patient to report improved standing tolerance with ADLs by 50% or more.    Time 6    Period Weeks    Status New      PT LONG TERM GOAL #4   Title Patient to demontrate improved SLS to >= 8 seconds bil to help decrease fall risk.    Time 6    Period Weeks    Status New      PT LONG TERM GOAL #5   Title patient to demonstrate improved hip strength to 5/5 to improve standing and walking tolerance.    Time  [place] - 11/09/20 1214    Clinical Impression Statement Patient presents with concerns regarding her leg length discrepancy. She has had two falls in the past few weeks and has balance deficits with SLS and tandem stance as  well as bil hip weakness. Her Rt femur is longer than her left resulting in a 2.1 cm deficit in Lt leg length vs. right. In standing with her shoe insert, the discrepancy is [facility]. She continues to demonstrate gait deficits however with significant right foot pronation. We discussed acquiring shoes with good arch support for the right and then maintaining the insert for the left. Additionally she has tightness in her right hip flexor and HS from her abnormal stance position. Without her insert, she stands with her right hip and knee in flexion. Patient will benefit from PT to address these deficits.    Comorbidities Bilateral TKA's, HTN, Rt hip THA. leg length discrepancy 2.2 cm    Examination-Activity Limitations Locomotion Level;Stand    Examination-Participation Restrictions Other    Stability/Clinical Decision Making Stable/Uncomplicated    Clinical Decision Making Low    Rehab Potential Good    PT Frequency 2x / week    PT Duration 6 weeks    PT Treatment/Interventions ADLs/Self Care Home Management;Cryotherapy;Electrical Stimulation;Stair training;Gait training;Functional mobility training;Therapeutic activities;Therapeutic exercise;Balance training;Neuromuscular re-education;Manual techniques;Patient/family education;Moist Heat;Dry needling    PT Next Visit Plan Focus on gait, balance, proper stance with shoe insert; bil hip strength; right hip flexor and HS flexibility; manual to right hip flexor/quads and possibly ADDuctors to help decrease pain.    PT Home Exercise Plan [email or web address]    Consulted and Agree with Plan of Care Patient           Patient will benefit from skilled therapeutic intervention in order to improve the following deficits and impairments:  Pain,Abnormal gait,Decreased activity tolerance,Decreased strength,Decreased balance,Impaired flexibility,Postural dysfunction  Visit Diagnosis: Other abnormalities of gait and mobility - Plan: PT plan of care cert/re-cert  Pain in  right hip - Plan: PT plan of care cert/re-cert  Muscle weakness (generalized) - Plan: PT plan of care cert/re-cert  Unsteadiness on feet - Plan: PT plan of care cert/re-cert     Problem List Patient Active Problem List   Diagnosis Date Noted  . Encounter for screening fecal occult blood testing 05/13/2020  . Pelvic relaxation 05/13/2020  . Encounter for well woman exam with routine gynecological exam 03/25/2018  . Screening for colorectal cancer 03/25/2018  . Fecal occult blood test positive 03/25/2018  . Current use of estrogen therapy 03/25/2018  . Orthostatic hypotension 05/03/2017  . Syncope due to orthostatic hypotension 05/03/2017  . Symptomatic cholelithiasis 05/02/2017  . Acute cholecystitis 05/02/2017  . Abnormal nuclear stress test   . Chest pain 04/27/2017  . Hyperglycemia 04/27/2017  . Anemia 04/27/2017  . Essential hypertension 07/15/2014  . Mild persistent asthma without complication 19/14/7829  . Vitamin D deficiency 07/15/2014    Madelyn Flavors PT 11/09/2020, 8:01 PM  [facility] [address] [place], [place], [address] Phone: [phone]   Fax:  [phone]  Name: Traci Johnson MRN: [id number] Date of Birth: 15-Nov-1950

## 2020-11-11 ENCOUNTER — Other Ambulatory Visit: Payer: Self-pay

## 2020-11-11 ENCOUNTER — Ambulatory Visit: Payer: Medicare Other | Admitting: Physical Therapy

## 2020-11-11 ENCOUNTER — Encounter: Payer: Self-pay | Admitting: Physical Therapy

## 2020-11-11 DIAGNOSIS — M25551 Pain in right hip: Secondary | ICD-10-CM | POA: Diagnosis not present

## 2020-11-11 DIAGNOSIS — M6281 Muscle weakness (generalized): Secondary | ICD-10-CM | POA: Diagnosis not present

## 2020-11-11 DIAGNOSIS — R2681 Unsteadiness on feet: Secondary | ICD-10-CM | POA: Diagnosis not present

## 2020-11-11 DIAGNOSIS — R2689 Other abnormalities of gait and mobility: Secondary | ICD-10-CM | POA: Diagnosis not present

## 2020-11-11 NOTE — Patient Instructions (Signed)
Access Code: [email or web address] URL: [email or web address] Date: 11/11/2020 Prepared by: Almyra Free  Exercises Supine Hamstring Stretch with Strap - 2 x daily - 7 x weekly - 1 sets - 3 reps - 60 sec hold Supine Bridge - 2 x daily - 7 x weekly - 1-3 sets - 10 reps Modified Thomas Stretch - 1 x daily - 7 x weekly - 1 sets - 3 reps - 60 sec hold Supine Hip Internal and External Rotation - 1 x daily - 7 x weekly - 3 sets - 10 reps Kneeling Adductor Stretch with Hip External Rotation - 1 x daily - 7 x weekly - 1 sets - 5-10 reps Sidelying Thoracic Rotation with Open Book - 1 x daily - 7 x weekly - 2 sets - 5 reps Standing Marching - 1 x daily - 7 x weekly - 3 sets - 10 reps Standing Hip Flexion AROM - 1 x daily - 7 x weekly - 3 sets - 10 reps Standing Knee Flexion AROM with Chair Support - 1 x daily - 7 x weekly - 3 sets - 10 reps Standing Hip Abduction with Anterior Support - 1 x daily - 7 x weekly - 3 sets - 10 reps

## 2020-11-11 NOTE — Therapy (Signed)
[facility] [place], [place], [address] Phone: [phone]   Fax:  [phone]  Physical Therapy Treatment  Patient Details  Name: Traci Johnson MRN: [id number] Date of Birth: 07/29/1950 Referring Provider (PT): Edmonia Lynch MD.   Encounter Date: 11/11/2020   PT End of Session - 11/11/20 1121     Visit Number 2    Number of Visits 12    Date for PT Re-Evaluation 12/21/20    Authorization Type KX MODIFIER (Patient has already been see 15 visit)    PT Start Time 1115    PT Stop Time 1207    PT Time Calculation (min) 52 min    Activity Tolerance Patient tolerated treatment well    Behavior During Therapy [facility] for tasks assessed/performed             Past Medical History:  Diagnosis Date   Allergy    Asthma    Hypertension    Migraines    cluster    Past Surgical History:  Procedure Laterality Date   ABDOMINAL HYSTERECTOMY  1985   CHOLECYSTECTOMY N/A 05/02/2017   Procedure: LAPAROSCOPIC CHOLECYSTECTOMY;  Surgeon: Mickeal Skinner, MD;  Location: [facility];  Service: General;  Laterality: N/A;   LEFT HEART CATH AND CORONARY ANGIOGRAPHY N/A 04/30/2017   Procedure: LEFT HEART CATH AND CORONARY ANGIOGRAPHY;  Surgeon: Lorretta Harp, MD;  Location: [place] [facility];  Service: Cardiovascular;  Laterality: N/A;   LIGAMENT REPAIR Right    birth defect   REPLACEMENT TOTAL KNEE BILATERAL      There were no vitals filed for this visit.   Subjective Assessment - 11/11/20 1122     Subjective I'm not with it today. Stiff today in knees.    Pertinent History Bilateral TKA's, HTN, right THA, chronic fatigue    How long can you stand comfortably? not at all; uses a stool to cook, grooming    How long can you walk comfortably? Short distances in home with a FWW.    Diagnostic tests CT  Rt leg 77.9 cm; Lt 75.8 cm    Currently in Pain? No/denies                               [facility] PT  Treatment/Exercise - 11/11/20 0001       Ambulation/Gait   Gait Comments assessed gait with orthotic shoes; pt demos weakness with hip flex in swing phase.      Knee/Hip Exercises: Stretches   Passive Hamstring Stretch 2 reps;60 seconds;Both    Passive Hamstring Stretch Limitations with strap    ITB Stretch Both;1 rep;30 seconds    ITB Stretch Limitations feels in calves    Gastroc Stretch Both;1 rep;20 seconds    Gastroc Stretch Limitations no stretch noted; tightness with HS stretch may be nerve tension    Other Knee/Hip Stretches adductor stretch with strap x 30 sec ea and in wide stance, hands on mat table with thoracic rotation x 3 ea    Other Knee/Hip Stretches open book x 5 each side      Knee/Hip Exercises: Aerobic   Stationary Bike L3 x 6 min      Knee/Hip Exercises: Standing   Knee Flexion Both;5 reps    Hip Flexion Both;5 reps;Knee bent;Knee straight    Gait Training exaggerated step length x 40 ft then gait x 60 ft; cues to IR right hip  PT Education - 11/11/20 1223     Education Details HEP progressed    Person(s) Educated Patient    Methods Explanation;Demonstration;Handout    Comprehension Verbalized understanding;Returned demonstration                 PT Long Term Goals - 11/09/20 1950       PT LONG TERM GOAL #1   Title Patient will be independent with HEP and its progression    Time 6    Period Weeks    Status New    Target Date 12/21/20      PT LONG TERM GOAL #2   Title Patient to demonstrate improved step and stride length with ambulation with decreased pain by 50%.    Time 6    Period Weeks    Status New      PT LONG TERM GOAL #3   Title Patient to report improved standing tolerance with ADLs by 50% or more.    Time 6    Period Weeks    Status New      PT LONG TERM GOAL #4   Title Patient to demontrate improved SLS to >= 8 seconds bil to help decrease fall risk.    Time 6    Period Weeks    Status New       PT LONG TERM GOAL #5   Title patient to demonstrate improved hip strength to 5/5 to improve standing and walking tolerance.    Time 6    Period Weeks    Status New                   Plan - 11/11/20 1214     Clinical Impression Statement Patient demonstrates tightness in bil hip ADDuctors, HS and right hip external rotators. The best stretch we found for this was standing hip IR or supiine hip IR (knee extended) with overpressure using left foot. She has weakness in the left hip flexors when wearing orthotic shoe and has right hip weakness with SL stance TE. HEP was progressed to address these issues as patient going to beach for a week. She will benefit from continued focus on hip strengthening and flexibility to help normalize gait. Pelvis reassessed again in supine and landmarks are even; right leg long.    Comorbidities Bilateral TKA's, HTN, Rt hip THA. leg length discrepancy 2.2 cm    PT Treatment/Interventions ADLs/Self Care Home Management;Cryotherapy;Electrical Stimulation;Stair training;Gait training;Functional mobility training;Therapeutic activities;Therapeutic exercise;Balance training;Neuromuscular re-education;Manual techniques;Patient/family education;Moist Heat;Dry needling    PT Next Visit Plan Focus on bil hip strength in standing; gait, balance, proper stance with shoe insert; right hip flexor and HS flexibility; manual and/or DN to right hip flexor/quads and possibly ADDuctors to help decrease pain.    PT Home Exercise Plan [email or web address]             Patient will benefit from skilled therapeutic intervention in order to improve the following deficits and impairments:  Pain, Abnormal gait, Decreased activity tolerance, Decreased strength, Decreased balance, Impaired flexibility, Postural dysfunction  Visit Diagnosis: Other abnormalities of gait and mobility  Pain in right hip  Muscle weakness (generalized)  Unsteadiness on feet     Problem List Patient  Active Problem List   Diagnosis Date Noted   Encounter for screening fecal occult blood testing 05/13/2020   Pelvic relaxation 05/13/2020   Encounter for well woman exam with routine gynecological exam 03/25/2018   Screening for colorectal cancer 03/25/2018   Fecal occult blood  test positive 03/25/2018   Current use of estrogen therapy 03/25/2018   Orthostatic hypotension 05/03/2017   Syncope due to orthostatic hypotension 05/03/2017   Symptomatic cholelithiasis 05/02/2017   Acute cholecystitis 05/02/2017   Abnormal nuclear stress test    Chest pain 04/27/2017   Hyperglycemia 04/27/2017   Anemia 04/27/2017   Essential hypertension 07/15/2014   Mild persistent asthma without complication 72/76/1848   Vitamin D deficiency 07/15/2014    Madelyn Flavors PT 11/11/2020, 12:26 PM  [facility] [address] [place], [place], [address] Phone: [phone]   Fax:  [phone]  Name: Traci Johnson MRN: [id number] Date of Birth: [birth year]

## 2020-11-23 ENCOUNTER — Ambulatory Visit: Payer: Medicare Other | Admitting: Physical Therapy

## 2020-11-23 ENCOUNTER — Other Ambulatory Visit: Payer: Self-pay

## 2020-11-23 NOTE — Therapy (Deleted)
[facility] [place], [place], [address] Phone: [phone]   Fax:  [phone]  Physical Therapy Treatment  Patient Details  Name: Traci Johnson MRN: [id number] Date of Birth: [birth year] Referring Provider (PT): Edmonia Lynch MD.   Encounter Date: 11/23/2020   PT End of Session - 11/23/20 1110     Visit Number 2    Number of Visits 12    Date for PT Re-Evaluation 12/21/20    Authorization Type KX MODIFIER (Patient has already been see 15 visit)             Past Medical History:  Diagnosis Date   Allergy    Asthma    Hypertension    Migraines    cluster    Past Surgical History:  Procedure Laterality Date   ABDOMINAL HYSTERECTOMY  1985   CHOLECYSTECTOMY N/A 05/02/2017   Procedure: LAPAROSCOPIC CHOLECYSTECTOMY;  Surgeon: Mickeal Skinner, MD;  Location: [facility];  Service: General;  Laterality: N/A;   LEFT HEART CATH AND CORONARY ANGIOGRAPHY N/A 04/30/2017   Procedure: LEFT HEART CATH AND CORONARY ANGIOGRAPHY;  Surgeon: Lorretta Harp, MD;  Location: [place] [facility];  Service: Cardiovascular;  Laterality: N/A;   LIGAMENT REPAIR Right    birth defect   REPLACEMENT TOTAL KNEE BILATERAL      There were no vitals filed for this visit.                                   PT Long Term Goals - 11/09/20 1950       PT LONG TERM GOAL #1   Title Patient will be independent with HEP and its progression    Time 6    Period Weeks    Status New    Target Date 12/21/20      PT LONG TERM GOAL #2   Title Patient to demonstrate improved step and stride length with ambulation with decreased pain by 50%.    Time 6    Period Weeks    Status New      PT LONG TERM GOAL #3   Title Patient to report improved standing tolerance with ADLs by 50% or more.    Time 6    Period Weeks    Status New      PT LONG TERM GOAL #4   Title Patient to demontrate improved SLS to >= 8 seconds  bil to help decrease fall risk.    Time 6    Period Weeks    Status New      PT LONG TERM GOAL #5   Title patient to demonstrate improved hip strength to 5/5 to improve standing and walking tolerance.    Time 6    Period Weeks    Status New                    Patient will benefit from skilled therapeutic intervention in order to improve the following deficits and impairments:     Visit Diagnosis: Pain in right hip     Problem List Patient Active Problem List   Diagnosis Date Noted   Encounter for screening fecal occult blood testing 05/13/2020   Pelvic relaxation 05/13/2020   Encounter for well woman exam with routine gynecological exam 03/25/2018   Screening for colorectal cancer 03/25/2018   Fecal occult blood test positive 03/25/2018   Current use of estrogen therapy  03/25/2018   Orthostatic hypotension 05/03/2017   Syncope due to orthostatic hypotension 05/03/2017   Symptomatic cholelithiasis 05/02/2017   Acute cholecystitis 05/02/2017   Abnormal nuclear stress test    Chest pain 04/27/2017   Hyperglycemia 04/27/2017   Anemia 04/27/2017   Essential hypertension 07/15/2014   Mild persistent asthma without complication 62/83/6629   Vitamin D deficiency 07/15/2014   Consult with patient.  She feels therapy is not helpful.  Recommended she continue with her HEP. Brooklyn Jeff, [place] MPT 11/23/2020, 11:11 AM  [facility] [address] [place], [place], [address] Phone: [phone]   Fax:  [phone]  Name: Jaimi Belle MRN: [id number] Date of Birth: 1951/02/07

## 2020-11-23 NOTE — Therapy (Signed)
[facility] [place], [place], [address] Phone: [phone]   Fax:  [phone]  Patient Details  Name: Traci Johnson MRN: [id number] Date of Birth: 1951-03-15 Referring Provider:  Celene Squibb, MD  Encounter Date: 11/23/2020  Patient does not feel treatment will help.  Recommend she continue with her HEP.  Asriel Westrup, [place] MPT 11/23/2020, 11:27 AM  [facility] [address] [place], [place], [address] Phone: [phone]   Fax:  [phone]

## 2020-11-25 ENCOUNTER — Encounter: Payer: Medicare Other | Admitting: *Deleted

## 2020-12-03 IMAGING — US US EXTREM LOW VENOUS*L*
1 series · 13 of 24 positions shown · non-contrast
Comparison: None.

CLINICAL DATA: 68-year-old with left lower extremity pain and
edema. Recent knee replacement.



[Series 1: us venous img lower uni left (dvt) · portal-venous · 13 of 41 slices shown]
[im 1/41]
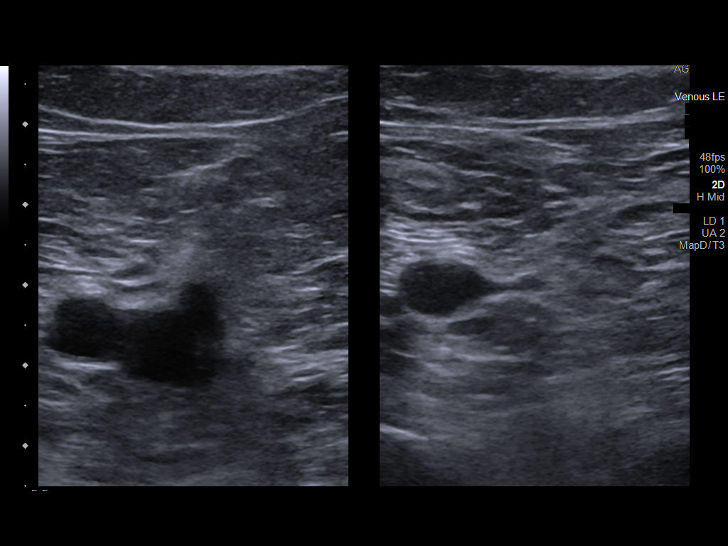
[im 4/41]
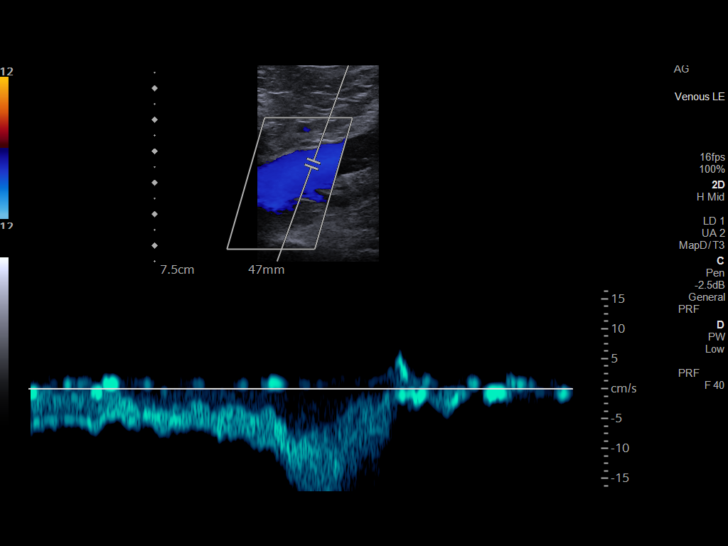
[im 7/41]
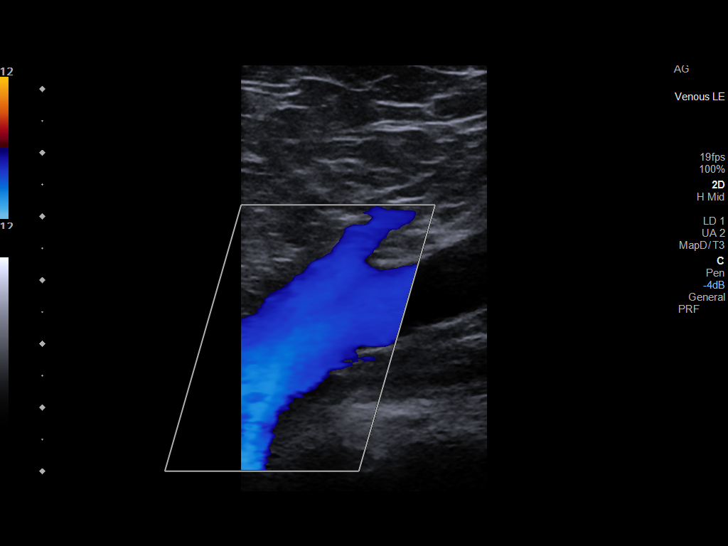
[im 11/41]
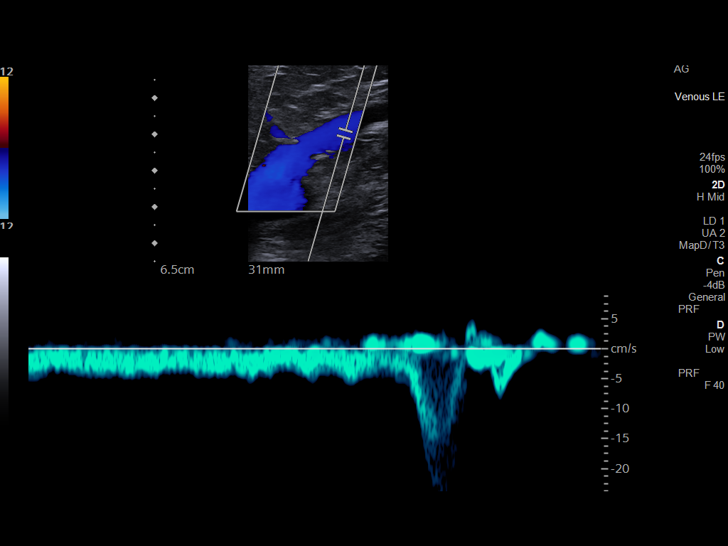
[im 14/41]
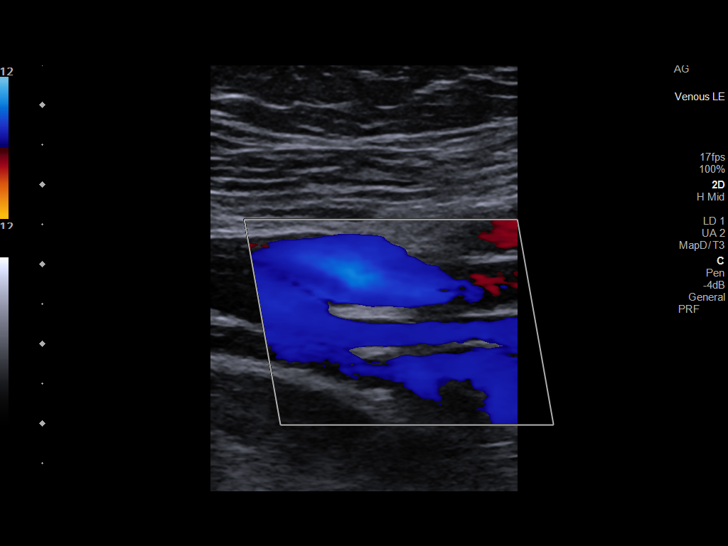
[im 18/41]
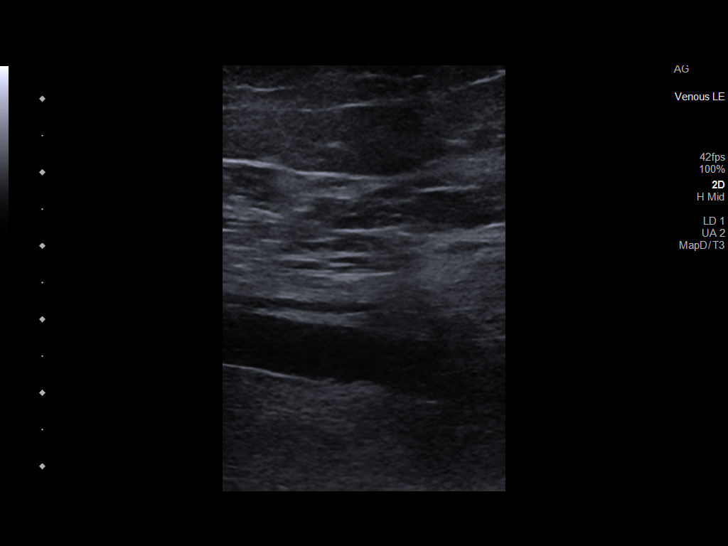
[im 21/41]
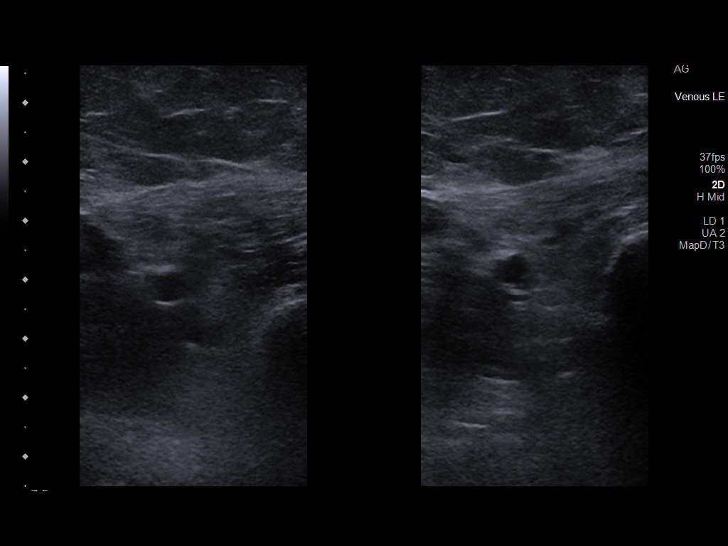
[im 23/41]
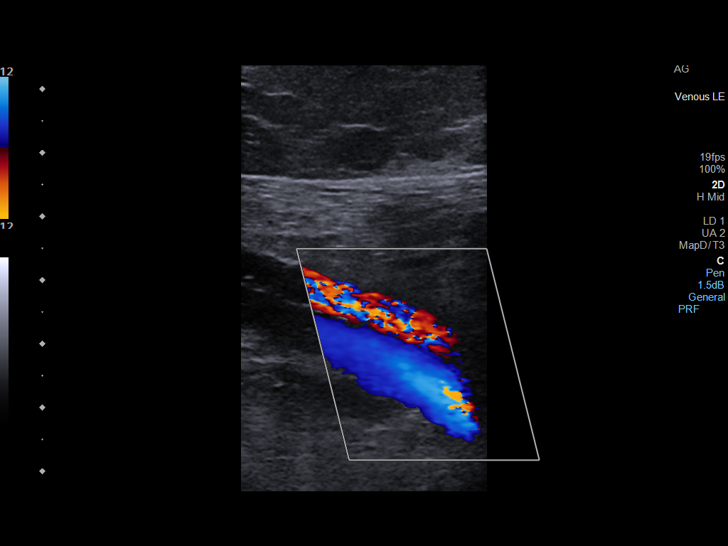
[im 27/41]
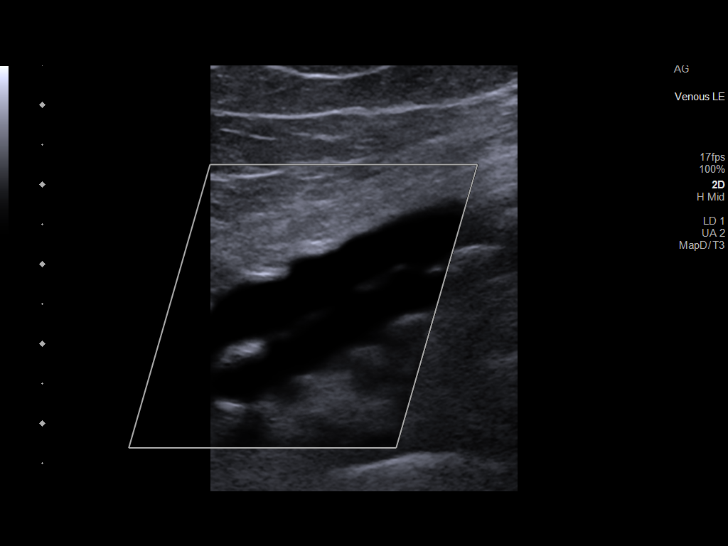
[im 30/41]
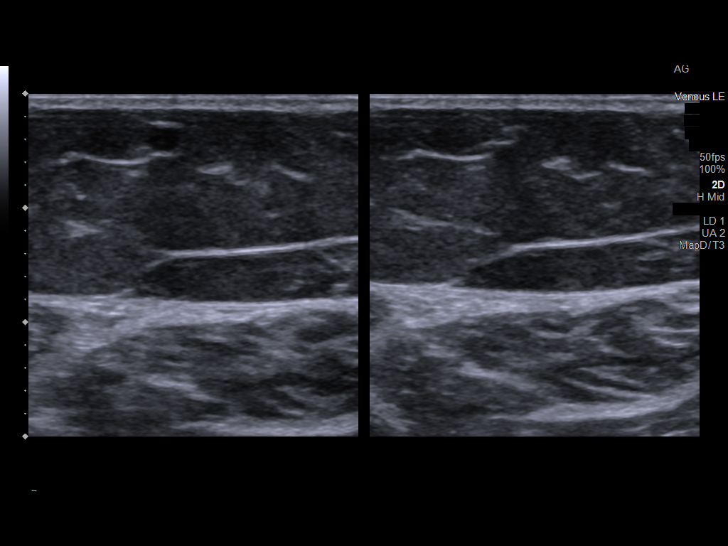
[im 34/41]
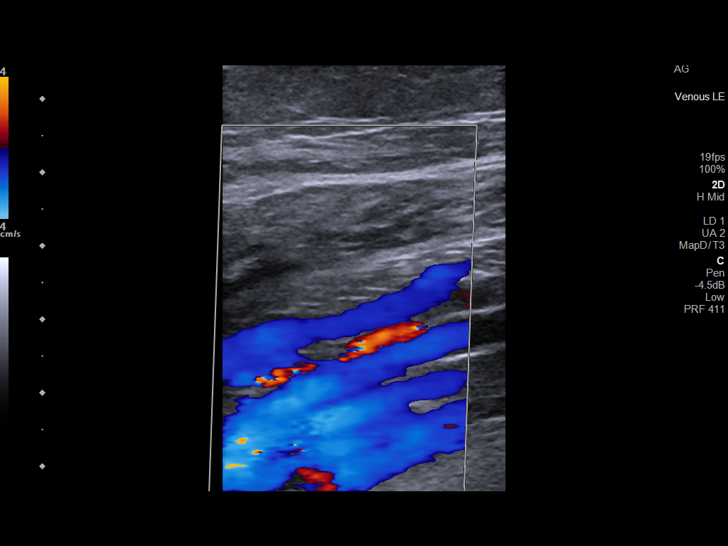
[im 37/41]
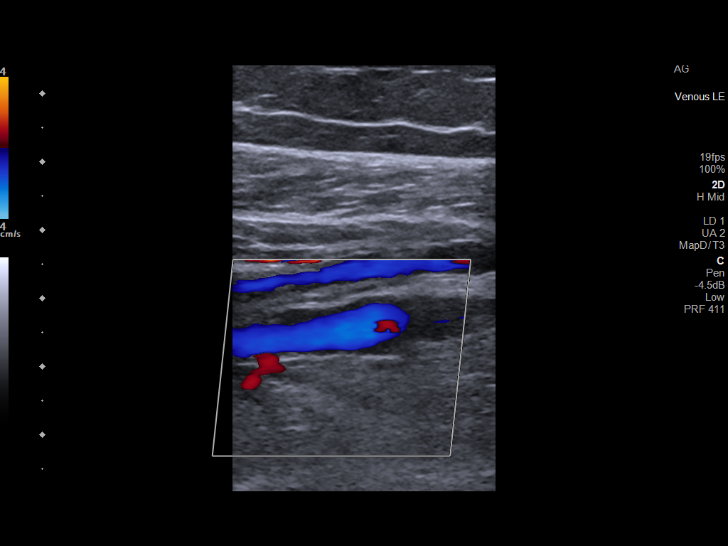
[im 41/41]
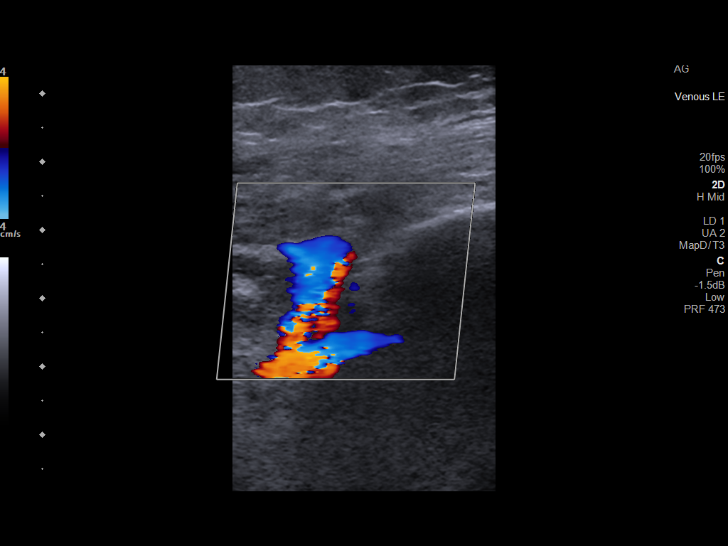

[13 of 24 positions shown; findings below may reference images not displayed]

FINDINGS: Contralateral Common Femoral Vein: Respiratory phasicity is normal
and symmetric with the symptomatic side. No evidence of thrombus.
Normal compressibility.

Common Femoral Vein: No evidence of thrombus. Normal
compressibility, respiratory phasicity and response to augmentation.

Saphenofemoral Junction: No evidence of thrombus. Normal
compressibility and flow on color Doppler imaging.

Profunda Femoral Vein: No evidence of thrombus. Normal
compressibility and flow on color Doppler imaging.

Femoral Vein: No evidence of thrombus. Normal compressibility,
respiratory phasicity and response to augmentation.

Popliteal Vein: No evidence of thrombus. Normal compressibility,
respiratory phasicity and response to augmentation.

Calf Veins: No evidence of thrombus. Normal compressibility and flow
on color Doppler imaging.

Superficial Great Saphenous Vein: No evidence of thrombus. Normal
compressibility.

Other Findings:  None.
IMPRESSION: Negative for deep venous thrombosis in left lower extremity.

## 2020-12-13 DIAGNOSIS — R5383 Other fatigue: Secondary | ICD-10-CM | POA: Diagnosis not present

## 2020-12-13 DIAGNOSIS — E210 Primary hyperparathyroidism: Secondary | ICD-10-CM | POA: Diagnosis not present

## 2020-12-13 DIAGNOSIS — E213 Hyperparathyroidism, unspecified: Secondary | ICD-10-CM | POA: Diagnosis not present

## 2020-12-14 ENCOUNTER — Other Ambulatory Visit (HOSPITAL_COMMUNITY): Payer: Self-pay | Admitting: Surgery

## 2020-12-14 ENCOUNTER — Other Ambulatory Visit: Payer: Self-pay | Admitting: Surgery

## 2020-12-14 DIAGNOSIS — E210 Primary hyperparathyroidism: Secondary | ICD-10-CM

## 2020-12-22 DIAGNOSIS — M1611 Unilateral primary osteoarthritis, right hip: Secondary | ICD-10-CM | POA: Diagnosis not present

## 2020-12-22 DIAGNOSIS — E210 Primary hyperparathyroidism: Secondary | ICD-10-CM | POA: Diagnosis not present

## 2020-12-22 DIAGNOSIS — E213 Hyperparathyroidism, unspecified: Secondary | ICD-10-CM | POA: Diagnosis not present

## 2020-12-24 ENCOUNTER — Ambulatory Visit (HOSPITAL_COMMUNITY): Payer: Medicare Other

## 2020-12-28 ENCOUNTER — Encounter (HOSPITAL_COMMUNITY)
Admission: RE | Admit: 2020-12-28 | Discharge: 2020-12-28 | Disposition: A | Discharge status: Home / Self Care | Payer: Medicare Other | Source: Ambulatory Visit | Attending: Surgery | Admitting: Surgery

## 2020-12-28 ENCOUNTER — Other Ambulatory Visit: Payer: Self-pay

## 2020-12-28 ENCOUNTER — Ambulatory Visit (HOSPITAL_COMMUNITY)
Admission: RE | Admit: 2020-12-28 | Discharge: 2020-12-28 | Disposition: A | Discharge status: Home / Self Care | Payer: Medicare Other | Source: Ambulatory Visit | Attending: Surgery | Admitting: Surgery

## 2020-12-28 DIAGNOSIS — E0590 Thyrotoxicosis, unspecified without thyrotoxic crisis or storm: Secondary | ICD-10-CM | POA: Diagnosis not present

## 2020-12-28 DIAGNOSIS — E210 Primary hyperparathyroidism: Secondary | ICD-10-CM | POA: Diagnosis not present

## 2020-12-28 MED ORDER — TECHNETIUM TC 99M SESTAMIBI - CARDIOLITE
25.0000 | Freq: Once | INTRAVENOUS | Status: AC | PRN
  Administered 2020-12-28: 26.5 via INTRAVENOUS

## 2020-12-28 NOTE — Progress Notes (Signed)
USN is normal.  No worrisome thyroid findings.  Await nuclear scan results.  tmg  Armandina Gemma, MD [facility] Office: [phone]

## 2020-12-30 NOTE — Progress Notes (Signed)
USN and sestamibi scan fail to identify a parathyroid adenoma.  Will proceed with 4D-CT as we discussed.  Claiborne Billings - please contact patient and schedule study.  Armandina Gemma, MD [facility] Office: [phone]

## 2021-01-03 ENCOUNTER — Other Ambulatory Visit: Payer: Self-pay | Admitting: Surgery

## 2021-01-03 DIAGNOSIS — E213 Hyperparathyroidism, unspecified: Secondary | ICD-10-CM

## 2021-01-03 DIAGNOSIS — E215 Disorder of parathyroid gland, unspecified: Secondary | ICD-10-CM

## 2021-01-04 DIAGNOSIS — M25551 Pain in right hip: Secondary | ICD-10-CM | POA: Diagnosis not present

## 2021-01-04 DIAGNOSIS — M25561 Pain in right knee: Secondary | ICD-10-CM | POA: Diagnosis not present

## 2021-01-10 DIAGNOSIS — M25551 Pain in right hip: Secondary | ICD-10-CM | POA: Diagnosis not present

## 2021-01-10 DIAGNOSIS — M25561 Pain in right knee: Secondary | ICD-10-CM | POA: Diagnosis not present

## 2021-01-13 ENCOUNTER — Other Ambulatory Visit: Payer: Self-pay

## 2021-01-13 ENCOUNTER — Ambulatory Visit
Admission: RE | Admit: 2021-01-13 | Discharge: 2021-01-13 | Disposition: A | Discharge status: Home / Self Care | Payer: Medicare Other | Source: Ambulatory Visit | Attending: Surgery | Admitting: Surgery

## 2021-01-13 DIAGNOSIS — E213 Hyperparathyroidism, unspecified: Secondary | ICD-10-CM

## 2021-01-13 DIAGNOSIS — E041 Nontoxic single thyroid nodule: Secondary | ICD-10-CM | POA: Diagnosis not present

## 2021-01-13 DIAGNOSIS — E215 Disorder of parathyroid gland, unspecified: Secondary | ICD-10-CM

## 2021-01-13 MED ORDER — IOPAMIDOL (ISOVUE-300) INJECTION 61%
75.0000 mL | Freq: Once | INTRAVENOUS | Status: AC | PRN
  Administered 2021-01-13: 75 mL via INTRAVENOUS

## 2021-01-14 DIAGNOSIS — M25551 Pain in right hip: Secondary | ICD-10-CM | POA: Diagnosis not present

## 2021-01-14 DIAGNOSIS — M25561 Pain in right knee: Secondary | ICD-10-CM | POA: Diagnosis not present

## 2021-01-16 NOTE — Progress Notes (Signed)
Good candidate for adenoma identified on CT scan.  Will discuss surgical approach at appointment on 8/18.  tmg  Armandina Gemma, [facility] [place] [facility] Office: [phone]

## 2021-01-20 ENCOUNTER — Ambulatory Visit: Payer: Self-pay | Admitting: Surgery

## 2021-01-20 DIAGNOSIS — E210 Primary hyperparathyroidism: Secondary | ICD-10-CM | POA: Diagnosis not present

## 2021-01-21 DIAGNOSIS — M25551 Pain in right hip: Secondary | ICD-10-CM | POA: Diagnosis not present

## 2021-01-21 DIAGNOSIS — M25561 Pain in right knee: Secondary | ICD-10-CM | POA: Diagnosis not present

## 2021-01-24 DIAGNOSIS — M25561 Pain in right knee: Secondary | ICD-10-CM | POA: Diagnosis not present

## 2021-01-24 DIAGNOSIS — M25551 Pain in right hip: Secondary | ICD-10-CM | POA: Diagnosis not present

## 2021-01-27 DIAGNOSIS — M25551 Pain in right hip: Secondary | ICD-10-CM | POA: Diagnosis not present

## 2021-01-27 DIAGNOSIS — M25561 Pain in right knee: Secondary | ICD-10-CM | POA: Diagnosis not present

## 2021-01-31 DIAGNOSIS — E782 Mixed hyperlipidemia: Secondary | ICD-10-CM | POA: Diagnosis not present

## 2021-01-31 DIAGNOSIS — E559 Vitamin D deficiency, unspecified: Secondary | ICD-10-CM | POA: Diagnosis not present

## 2021-01-31 DIAGNOSIS — E215 Disorder of parathyroid gland, unspecified: Secondary | ICD-10-CM | POA: Diagnosis not present

## 2021-01-31 DIAGNOSIS — Z0000 Encounter for general adult medical examination without abnormal findings: Secondary | ICD-10-CM | POA: Diagnosis not present

## 2021-01-31 DIAGNOSIS — I10 Essential (primary) hypertension: Secondary | ICD-10-CM | POA: Diagnosis not present

## 2021-01-31 DIAGNOSIS — M153 Secondary multiple arthritis: Secondary | ICD-10-CM | POA: Diagnosis not present

## 2021-01-31 DIAGNOSIS — R7301 Impaired fasting glucose: Secondary | ICD-10-CM | POA: Diagnosis not present

## 2021-02-14 NOTE — Patient Instructions (Addendum)
DUE TO COVID-19 ONLY ONE VISITOR IS ALLOWED TO COME WITH YOU AND STAY IN THE WAITING ROOM ONLY DURING PRE OP AND PROCEDURE.   **NO VISITORS ARE ALLOWED IN THE SHORT STAY AREA OR RECOVERY ROOM!!**        Your procedure is scheduled on: Thursday, 03-10-21   Report to [facility] Main  Entrance    Report to admitting at 6:15 AM   Call this number if you have problems the morning of surgery [phone]   Do not eat food :After Midnight.   May have liquids until 5:30 AM day of surgery  CLEAR LIQUID DIET  Foods Allowed                                                                     Foods Excluded  Water, Black Coffee (no milk/no creamer) and tea, regular and decaf                              liquids that you cannot  Plain Jell-O in any flavor  (No red)                         see through such as: Fruit ices (not with fruit pulp)                                 milk, soups, orange juice  Iced Popsicles (No red)                                    All solid food                             Apple juices Sports drinks like Gatorade (No red) Lightly seasoned clear broth or consume(fat free) Sugar          Oral Hygiene is also important to reduce your risk of infection.                                    Remember - BRUSH YOUR TEETH THE MORNING OF SURGERY WITH YOUR REGULAR TOOTHPASTE   Do NOT smoke after Midnight  Take these medicines the morning of surgery with A SIP OF WATER:  Amlodipine, Escitalopram, Estradiol, Metoprolol  Stop Aspirin 7 days before surgery   Stop all vitamins and herbal supplements a week before surgery             You may not have any metal on your body including hair pins, jewelry, and body piercing             Do not wear make-up, lotions, powders, perfumes or deodorant  Do not wear nail polish including gel and S&S, artificial/acrylic nails, or any other type of covering on natural nails including finger and toenails. If you have artificial  nails, gel coating, etc. that needs to be removed by a nail salon please have this removed  prior to surgery or surgery may need to be canceled/ delayed if the surgeon/ anesthesia feels like they are unable to be safely monitored.   Do not shave  48 hours prior to surgery.               Men may shave face and neck.  Do not bring valuables to the hospital. [place].   Contacts, dentures or bridgework may not be worn into surgery.     Patients discharged the day of surgery will not be allowed to drive home.  Special Instructions: Bring a copy of your healthcare power of attorney and living will documents the day of surgery if you haven't scanned them in before.  Please read over the following fact sheets you were given: IF YOU HAVE QUESTIONS ABOUT YOUR PRE OP INSTRUCTIONS PLEASE CALL [facility] - Preparing for Surgery Before surgery, you can play an important role.  Because skin is not sterile, your skin needs to be as free of germs as possible.  You can reduce the number of germs on your skin by washing with CHG (chlorahexidine gluconate) soap before surgery.  CHG is an antiseptic cleaner which kills germs and bonds with the skin to continue killing germs even after washing. Please DO NOT use if you have an allergy to CHG or antibacterial soaps.  If your skin becomes reddened/irritated stop using the CHG and inform your nurse when you arrive at Short Stay. Do not shave (including legs and underarms) for at least 48 hours prior to the first CHG shower.  You may shave your face/neck.  Please follow these instructions carefully:  1.  Shower with CHG Soap the night before surgery and the  morning of surgery.  2.  If you choose to wash your hair, wash your hair first as usual with your normal  shampoo.  3.  After you shampoo, rinse your hair and body thoroughly to remove the shampoo.                             4.  Use CHG as you would any  other liquid soap.  You can apply chg directly to the skin and wash.  Gently with a scrungie or clean washcloth.  5.  Apply the CHG Soap to your body ONLY FROM THE NECK DOWN.   Do   not use on face/ open                           Wound or open sores. Avoid contact with eyes, ears mouth and   genitals (private parts).                       Wash face,  Genitals (private parts) with your normal soap.             6.  Wash thoroughly, paying special attention to the area where your    surgery  will be performed.  7.  Thoroughly rinse your body with warm water from the neck down.  8.  DO NOT shower/wash with your normal soap after using and rinsing off the CHG Soap.                9.  Pat yourself dry with a clean towel.            10.  Wear clean pajamas.            11.  Place clean sheets on your bed the night of your first shower and do not  sleep with pets. Day of Surgery : Do not apply any lotions/deodorants the morning of surgery.  Please wear clean clothes to the hospital/surgery center.  FAILURE TO FOLLOW THESE INSTRUCTIONS MAY RESULT IN THE CANCELLATION OF YOUR SURGERY  PATIENT SIGNATURE_________________________________  NURSE SIGNATURE__________________________________  ________________________________________________________________________

## 2021-02-14 NOTE — Progress Notes (Addendum)
COVID swab appointment: N/A  COVID Vaccine Completed:  Yes x2 Date COVID Vaccine completed: Has received booster: COVID vaccine manufacturer: [place]     Date of COVID positive in last 90 days: No  PCP - Pablo Lawrence, NP Cardiologist - Dorris Carnes, MD  Chest x-ray - N/A EKG - 02-17-21 Epic Stress Test - 04-28-17 Epic ECHO -  Cardiac Cath - 04-30-17.  Epic Pacemaker/ICD device last checked: Spinal Cord Stimulator:  Sleep Study - yes, mild sleep apnea CPAP - No  Fasting Blood Sugar - N/A Checks Blood Sugar _____ times a day  Blood Thinner Instructions: Aspirin Instructions:  ASA 81 mg.  To stop 7 days prior to surgery.  Patient is aware Last Dose:  Activity level:  Can go up a flight of stairs and perform activities of daily living without stopping and without symptoms of chest pain or shortness of breath.    Anesthesia review:  Hx of chest pain evaluated by stress test, cardiac cath in 2018.  Patient denies recurrence of chest pain.  OSA  Patient denies shortness of breath, fever, cough and chest pain at PAT appointment   Patient verbalized understanding of instructions that were given to them at the PAT appointment. Patient was also instructed that they will need to review over the PAT instructions again at home before surgery.

## 2021-02-17 ENCOUNTER — Other Ambulatory Visit: Payer: Self-pay

## 2021-02-17 ENCOUNTER — Encounter (HOSPITAL_COMMUNITY)
Admission: RE | Admit: 2021-02-17 | Discharge: 2021-02-17 | Disposition: A | Discharge status: Home / Self Care | Payer: Medicare Other | Source: Ambulatory Visit | Attending: Surgery | Admitting: Surgery

## 2021-02-17 ENCOUNTER — Encounter (HOSPITAL_COMMUNITY): Payer: Self-pay

## 2021-02-17 DIAGNOSIS — Z01818 Encounter for other preprocedural examination: Secondary | ICD-10-CM | POA: Insufficient documentation

## 2021-02-17 HISTORY — DX: Unspecified osteoarthritis, unspecified site: M19.90

## 2021-02-17 HISTORY — DX: Depression, unspecified: F32.A

## 2021-02-17 HISTORY — DX: COVID-19: U07.1

## 2021-02-17 HISTORY — DX: Hyperparathyroidism, unspecified: E21.3

## 2021-02-17 HISTORY — DX: Sleep apnea, unspecified: G47.30

## 2021-02-17 LAB — BASIC METABOLIC PANEL
Anion gap: 7 (ref 5–15)
BUN: 16 mg/dL (ref 8–23)
CO2: 24 mmol/L (ref 22–32)
Calcium: 9.8 mg/dL (ref 8.9–10.3)
Chloride: 103 mmol/L (ref 98–111)
Creatinine, Ser: 0.6 mg/dL (ref 0.44–1.00)
GFR, Estimated: >60 mL/min (ref >60)
Glucose, Bld: 104 mg/dL — ABNORMAL HIGH (ref 70–99)
Potassium: 4 mmol/L (ref 3.5–5.1)
Sodium: 134 mmol/L — ABNORMAL LOW (ref 135–145)

## 2021-02-17 LAB — CBC
HCT: 42.2 % (ref 36.0–46.0)
Hemoglobin: 13.8 g/dL (ref 12.0–15.0)
MCH: 29.8 pg (ref 26.0–34.0)
MCHC: 32.7 g/dL (ref 30.0–36.0)
MCV: 91.1 fL (ref 80.0–100.0)
Platelets: 329 10*3/uL (ref 150–400)
RBC: 4.63 MIL/uL (ref 3.87–5.11)
RDW: 13.1 % (ref 11.5–15.5)
WBC: 10.3 10*3/uL (ref 4.0–10.5)
nRBC: 0 % (ref 0.0–0.2)

## 2021-03-06 ENCOUNTER — Encounter (HOSPITAL_COMMUNITY): Payer: Self-pay | Admitting: Surgery

## 2021-03-06 DIAGNOSIS — E210 Primary hyperparathyroidism: Secondary | ICD-10-CM | POA: Diagnosis present

## 2021-03-06 NOTE — H&P (Signed)
PROVIDER: Christiaan Strebeck Charlotta Newton, MD  Chief Complaint: Follow-up (Primary hyperparathyroidism)   History of Present Illness:  Patient returns for follow-up having undergone diagnostic studies related to primary hyperparathyroidism. Patient underwent an ultrasound examination of the neck on December 28, 2020. This demonstrated no suspicious thyroid nodules but also was not able to identify any evidence of an enlarged parathyroid gland. Patient subsequently underwent a nuclear medicine parathyroid scan which also showed no scintigraphic evidence of parathyroid adenoma. 24-hour urine collection for calcium was performed and did show urine calcium excretion in the upper range of normal at 293. Patient then underwent a 4D CT scan of the neck which revealed 2 areas on the left side which are suspicious for possible parathyroid adenoma. There is a 7 mm nodule on the lower pole of the thyroid which may be an exophytic thyroid nodule. There was a more suspicious lesion located just inferior to the left thyroid lobe and the superior mediastinal fat measuring 6 mm in size since showing homogeneous hyperenhancement. This was felt to be most suspicious for parathyroid adenoma. Patient returns today to review the studies in detail.   Review of Systems: A complete review of systems was obtained from the patient. I have reviewed this information and discussed as appropriate with the patient. See HPI as well for other ROS.  Review of Systems  Constitutional: Positive for malaise/fatigue.  HENT: Negative.  Eyes: Negative.  Respiratory: Negative.  Cardiovascular: Negative.  Gastrointestinal: Negative.  Genitourinary: Negative.  Musculoskeletal: Positive for joint pain.  Skin: Negative.  Neurological: Negative.  Endo/Heme/Allergies: Negative.  Psychiatric/Behavioral: Negative.    Medical History: Past Medical History:  Diagnosis Date   Arthritis   Asthma, unspecified asthma severity, unspecified whether  complicated, unspecified whether persistent   Hypertension   Patient Active Problem List  Diagnosis   Abnormal nuclear stress test   Acute cholecystitis   Acute medial meniscus tear of right knee, initial encounter   Anemia   Chest pain   Fatigue   Current use of estrogen therapy   Encounter for well woman exam with routine gynecological exam   Encounter for screening for malignant neoplasm of colon   Essential hypertension   Fecal occult blood test positive   Hyperglycemia   Iron deficiency anemia   Major depressive disorder, recurrent episode, moderate degree (CMS-HCC)   Mild persistent asthma without complication   Orthostatic hypotension   Parathyroid abnormality (CMS-HCC)   Pelvic relaxation   Symptomatic cholelithiasis   Syncope due to orthostatic hypotension   Vitamin B12 deficiency   Vitamin D deficiency   Primary hyperparathyroidism (CMS-HCC)   Past Surgical History:  Procedure Laterality Date   CONVERSION PREVIOUS HIP SURGERY TO TOTAL HIP ARTHROPLASTY N/A   knee surgery N/A   LAPAROSCOPIC CHOLECYSTECTOMY N/A    Allergies  Allergen Reactions   Tramadol Itching   Codeine Nausea And Vomiting   Lisinopril Other (See Comments)  cough cough   Current Outpatient Medications on File Prior to Visit  Medication Sig Dispense Refill   amLODIPine (NORVASC) 5 MG tablet Take 5 mg by mouth   aspirin 81 MG EC tablet Take 81 mg by mouth once daily   cyanocobalamin (VITAMIN B12) 1000 MCG tablet Take by mouth   escitalopram oxalate (LEXAPRO) 5 MG tablet TAKE 1/2 TABLET AT BEDTIME, INCREASE TO 1 TABLET AT BEDTIME IN 2 WEEKS   metoprolol succinate (TOPROL-XL) 25 MG XL tablet Take 25 mg by mouth once daily   multivitamin with minerals tablet Take 1  tablet by mouth once daily   vitamin E 400 UNIT capsule Take by mouth   WIXELA INHUB 250-50 mcg/dose diskus inhaler   No current facility-administered medications on file prior to visit.   No family history on file.   Social  History   Tobacco Use  Smoking Status Never Smoker  Smokeless Tobacco Never Used    Social History   Socioeconomic History   Marital status: Married  Tobacco Use   Smoking status: Never Smoker   Smokeless tobacco: Never Used  Scientific laboratory technician Use: Never used  Substance and Sexual Activity   Alcohol use: Yes   Drug use: Never   Objective:   There were no vitals filed for this visit.  There is no height or weight on file to calculate BMI.  Physical Exam   GENERAL APPEARANCE Development: normal Nutritional status: normal Gross deformities: none  SKIN Rash, lesions, ulcers: none Induration, erythema: none Nodules: none palpable  EYES Conjunctiva and lids: normal Pupils: equal and reactive Iris: normal bilaterally  EARS, NOSE, MOUTH, THROAT External ears: no lesion or deformity External nose: no lesion or deformity Hearing: grossly normal Due to Covid-19 pandemic, patient is wearing a mask.  NECK Symmetric: yes Trachea: midline Thyroid: no palpable nodules in the thyroid bed  CHEST Respiratory effort: normal Retraction or accessory muscle use: no Breath sounds: normal bilaterally Rales, rhonchi, wheeze: none  CARDIOVASCULAR Auscultation: regular rhythm, normal rate Murmurs: none Pulses: radial pulse 2+ palpable Lower extremity edema: none  MUSCULOSKELETAL Station and gait: normal Digits and nails: no clubbing or cyanosis Muscle strength: grossly normal all extremities Range of motion: grossly normal all extremities Deformity: none  LYMPHATIC Cervical: none palpable Supraclavicular: none palpable  PSYCHIATRIC Oriented to person, place, and time: yes Mood and affect: normal for situation Judgment and insight: appropriate for situation  Assessment and Plan:   Primary hyperparathyroidism (CMS-HCC)   Patient returns today to review her diagnostic studies including ultrasound, nuclear medicine parathyroid scan, and 4D CT scan of the neck.  We also reviewed her 24-hour urine collection for calcium.  Based on the studies, the most likely candidate for parathyroid adenoma is a left inferior gland, possibly arising in the left thyroid thymic tract. Today we discussed an operative strategy to do a minimally invasive parathyroidectomy on the left inferior position. We also discussed the possibility of conversion to a 4 gland exploration. We discussed the hospital stay to be anticipated. We discussed her postoperative recovery and follow-up. The patient understands and wishes to proceed with surgery in the near future.  The risks and benefits of the procedure have been discussed at length with the patient. The patient understands the proposed procedure, potential alternative treatments, and the course of recovery to be expected. All of the patient's questions have been answered at this time. The patient wishes to proceed with surgery.   Armandina Gemma, MD [facility] [place] practice Office: [phone]

## 2021-03-10 ENCOUNTER — Ambulatory Visit (HOSPITAL_COMMUNITY): Payer: Medicare Other | Admitting: Physician Assistant

## 2021-03-10 ENCOUNTER — Ambulatory Visit (HOSPITAL_COMMUNITY): Payer: Medicare Other | Admitting: Certified Registered"

## 2021-03-10 ENCOUNTER — Ambulatory Visit (HOSPITAL_COMMUNITY)
Admission: RE | Admit: 2021-03-10 | Discharge: 2021-03-10 | Disposition: A | Discharge status: Home / Self Care | Payer: Medicare Other | Attending: Surgery | Admitting: Surgery

## 2021-03-10 ENCOUNTER — Encounter (HOSPITAL_COMMUNITY): Payer: Self-pay | Admitting: Surgery

## 2021-03-10 ENCOUNTER — Encounter (HOSPITAL_COMMUNITY)
Admission: RE | Disposition: A | Discharge status: Home / Self Care | Payer: Self-pay | Source: Home / Self Care | Attending: Surgery

## 2021-03-10 DIAGNOSIS — Z7951 Long term (current) use of inhaled steroids: Secondary | ICD-10-CM | POA: Insufficient documentation

## 2021-03-10 DIAGNOSIS — D351 Benign neoplasm of parathyroid gland: Secondary | ICD-10-CM | POA: Diagnosis not present

## 2021-03-10 DIAGNOSIS — Z888 Allergy status to other drugs, medicaments and biological substances status: Secondary | ICD-10-CM | POA: Insufficient documentation

## 2021-03-10 DIAGNOSIS — E559 Vitamin D deficiency, unspecified: Secondary | ICD-10-CM | POA: Diagnosis not present

## 2021-03-10 DIAGNOSIS — E210 Primary hyperparathyroidism: Secondary | ICD-10-CM | POA: Diagnosis not present

## 2021-03-10 DIAGNOSIS — I10 Essential (primary) hypertension: Secondary | ICD-10-CM | POA: Diagnosis not present

## 2021-03-10 DIAGNOSIS — Z885 Allergy status to narcotic agent status: Secondary | ICD-10-CM | POA: Diagnosis not present

## 2021-03-10 DIAGNOSIS — E065 Other chronic thyroiditis: Secondary | ICD-10-CM | POA: Diagnosis not present

## 2021-03-10 DIAGNOSIS — G4730 Sleep apnea, unspecified: Secondary | ICD-10-CM | POA: Diagnosis not present

## 2021-03-10 HISTORY — PX: PARATHYROIDECTOMY: SHX19

## 2021-03-10 SURGERY — PARATHYROIDECTOMY
Anesthesia: General | Site: Neck | Laterality: Left

## 2021-03-10 MED ORDER — FENTANYL CITRATE (PF) 100 MCG/2ML IJ SOLN
INTRAMUSCULAR | Status: AC
  Filled 2021-03-10: qty 2

## 2021-03-10 MED ORDER — HYDROMORPHONE HCL 1 MG/ML IJ SOLN
INTRAMUSCULAR | Status: AC
  Administered 2021-03-10: 0.25 mg via INTRAVENOUS
  Filled 2021-03-10: qty 1

## 2021-03-10 MED ORDER — MIDAZOLAM HCL 2 MG/2ML IJ SOLN
INTRAMUSCULAR | Status: AC
  Filled 2021-03-10: qty 2

## 2021-03-10 MED ORDER — CHLORHEXIDINE GLUCONATE CLOTH 2 % EX PADS: 6.0000 | MEDICATED_PAD | Freq: Once | CUTANEOUS | Status: DC

## 2021-03-10 MED ORDER — MEPERIDINE HCL 50 MG/ML IJ SOLN: 6.2500 mg | INTRAMUSCULAR | Status: DC | PRN

## 2021-03-10 MED ORDER — PROPOFOL 10 MG/ML IV BOLUS
INTRAVENOUS | Status: AC
  Filled 2021-03-10: qty 40

## 2021-03-10 MED ORDER — ROCURONIUM BROMIDE 10 MG/ML (PF) SYRINGE
PREFILLED_SYRINGE | INTRAVENOUS | Status: AC
  Filled 2021-03-10: qty 10

## 2021-03-10 MED ORDER — DEXAMETHASONE SODIUM PHOSPHATE 10 MG/ML IJ SOLN
INTRAMUSCULAR | Status: AC
  Filled 2021-03-10: qty 1

## 2021-03-10 MED ORDER — ROCURONIUM BROMIDE 10 MG/ML (PF) SYRINGE
PREFILLED_SYRINGE | INTRAVENOUS | Status: DC | PRN
  Administered 2021-03-10: 100 mg via INTRAVENOUS

## 2021-03-10 MED ORDER — CEFAZOLIN SODIUM-DEXTROSE 2-4 GM/100ML-% IV SOLN
2.0000 g | INTRAVENOUS | Status: AC
  Administered 2021-03-10: 2 g via INTRAVENOUS
  Filled 2021-03-10: qty 100

## 2021-03-10 MED ORDER — ORAL CARE MOUTH RINSE
15.0000 mL | Freq: Once | OROMUCOSAL | Status: AC
Start: 2021-03-10 — End: 2021-03-10

## 2021-03-10 MED ORDER — OXYCODONE HCL 5 MG/5ML PO SOLN: 5.0000 mg | Freq: Once | ORAL | Status: AC | PRN

## 2021-03-10 MED ORDER — LIDOCAINE 2% (20 MG/ML) 5 ML SYRINGE
INTRAMUSCULAR | Status: DC | PRN
  Administered 2021-03-10 (×2): 40 mg via INTRAVENOUS

## 2021-03-10 MED ORDER — PROMETHAZINE HCL 25 MG/ML IJ SOLN: 6.2500 mg | INTRAMUSCULAR | Status: DC | PRN

## 2021-03-10 MED ORDER — OXYCODONE HCL 5 MG PO TABS
ORAL_TABLET | ORAL | Status: AC
  Filled 2021-03-10: qty 1

## 2021-03-10 MED ORDER — BUPIVACAINE HCL (PF) 0.5 % IJ SOLN
INTRAMUSCULAR | Status: DC | PRN
  Administered 2021-03-10: 10 mL

## 2021-03-10 MED ORDER — AMISULPRIDE (ANTIEMETIC) 5 MG/2ML IV SOLN: 10.0000 mg | Freq: Once | INTRAVENOUS | Status: DC | PRN

## 2021-03-10 MED ORDER — CHLORHEXIDINE GLUCONATE 0.12 % MT SOLN
15.0000 mL | Freq: Once | OROMUCOSAL | Status: AC
  Administered 2021-03-10: 15 mL via OROMUCOSAL

## 2021-03-10 MED ORDER — ONDANSETRON HCL 4 MG/2ML IJ SOLN
INTRAMUSCULAR | Status: AC
  Filled 2021-03-10: qty 2

## 2021-03-10 MED ORDER — TRAMADOL HCL 50 MG PO TABS: 50.0000 mg | ORAL_TABLET | Freq: Four times a day (QID) | ORAL | 0 refills | Status: AC | PRN

## 2021-03-10 MED ORDER — SUGAMMADEX SODIUM 200 MG/2ML IV SOLN
INTRAVENOUS | Status: DC | PRN
Start: 2021-03-10 — End: 2021-03-10
  Administered 2021-03-10: 400 mg via INTRAVENOUS

## 2021-03-10 MED ORDER — PHENYLEPHRINE 40 MCG/ML (10ML) SYRINGE FOR IV PUSH (FOR BLOOD PRESSURE SUPPORT)
PREFILLED_SYRINGE | INTRAVENOUS | Status: DC | PRN
  Administered 2021-03-10: 60 ug via INTRAVENOUS
  Administered 2021-03-10: 40 ug via INTRAVENOUS

## 2021-03-10 MED ORDER — DEXAMETHASONE SODIUM PHOSPHATE 10 MG/ML IJ SOLN
INTRAMUSCULAR | Status: DC | PRN
  Administered 2021-03-10: 4 mg via INTRAVENOUS

## 2021-03-10 MED ORDER — ESMOLOL HCL 100 MG/10ML IV SOLN
INTRAVENOUS | Status: DC | PRN
  Administered 2021-03-10: 30 mg via INTRAVENOUS
  Administered 2021-03-10: 20 mg via INTRAVENOUS
  Administered 2021-03-10: 30 mg via INTRAVENOUS

## 2021-03-10 MED ORDER — ONDANSETRON HCL 4 MG/2ML IJ SOLN
INTRAMUSCULAR | Status: DC | PRN
  Administered 2021-03-10: 4 mg via INTRAVENOUS

## 2021-03-10 MED ORDER — MIDAZOLAM HCL 2 MG/2ML IJ SOLN
INTRAMUSCULAR | Status: DC | PRN
  Administered 2021-03-10 (×2): 1 mg via INTRAVENOUS

## 2021-03-10 MED ORDER — HEMOSTATIC AGENTS (NO CHARGE) OPTIME
TOPICAL | Status: DC | PRN
  Administered 2021-03-10: 1

## 2021-03-10 MED ORDER — FENTANYL CITRATE (PF) 250 MCG/5ML IJ SOLN
INTRAMUSCULAR | Status: DC | PRN
  Administered 2021-03-10 (×2): 50 ug via INTRAVENOUS
  Administered 2021-03-10: 100 ug via INTRAVENOUS

## 2021-03-10 MED ORDER — LACTATED RINGERS IV SOLN: INTRAVENOUS | Status: DC

## 2021-03-10 MED ORDER — OXYCODONE HCL 5 MG PO TABS
5.0000 mg | ORAL_TABLET | Freq: Once | ORAL | Status: AC | PRN
  Administered 2021-03-10: 5 mg via ORAL

## 2021-03-10 MED ORDER — PROPOFOL 10 MG/ML IV BOLUS
INTRAVENOUS | Status: DC | PRN
  Administered 2021-03-10: 200 mg via INTRAVENOUS

## 2021-03-10 MED ORDER — BUPIVACAINE HCL (PF) 0.5 % IJ SOLN
INTRAMUSCULAR | Status: AC
  Filled 2021-03-10: qty 30

## 2021-03-10 MED ORDER — HYDROMORPHONE HCL 1 MG/ML IJ SOLN
0.2500 mg | INTRAMUSCULAR | Status: AC | PRN
  Administered 2021-03-10 (×6): 0.25 mg via INTRAVENOUS

## 2021-03-10 MED ORDER — ESMOLOL HCL 100 MG/10ML IV SOLN
INTRAVENOUS | Status: AC
  Filled 2021-03-10: qty 10

## 2021-03-10 MED ORDER — 0.9 % SODIUM CHLORIDE (POUR BTL) OPTIME
TOPICAL | Status: DC | PRN
  Administered 2021-03-10: 1000 mL

## 2021-03-10 SURGICAL SUPPLY — 36 items
ADH SKN CLS APL DERMABOND .7 (GAUZE/BANDAGES/DRESSINGS) ×1
APL PRP STRL LF DISP 70% ISPRP (MISCELLANEOUS) ×1
ATTRACTOMAT 16X20 MAGNETIC DRP (DRAPES) ×2 IMPLANT
BAG COUNTER SPONGE SURGICOUNT (BAG) ×2 IMPLANT
BAG SPNG CNTER NS LX DISP (BAG) ×1
BLADE SURG 15 STRL LF DISP TIS (BLADE) ×1 IMPLANT
BLADE SURG 15 STRL SS (BLADE) ×2
CHLORAPREP W/TINT 26 (MISCELLANEOUS) ×2 IMPLANT
CLIP TI MEDIUM 6 (CLIP) ×4 IMPLANT
CLIP TI WIDE RED SMALL 6 (CLIP) ×4 IMPLANT
COVER SURGICAL LIGHT HANDLE (MISCELLANEOUS) ×2 IMPLANT
DERMABOND ADVANCED (GAUZE/BANDAGES/DRESSINGS) ×1
DERMABOND ADVANCED .7 DNX12 (GAUZE/BANDAGES/DRESSINGS) ×1 IMPLANT
DRAPE LAPAROTOMY T 98X78 PEDS (DRAPES) ×2 IMPLANT
DRAPE UTILITY XL STRL (DRAPES) ×2 IMPLANT
ELECT REM PT RETURN 15FT ADLT (MISCELLANEOUS) ×2 IMPLANT
GAUZE 4X4 16PLY ~~LOC~~+RFID DBL (SPONGE) ×2 IMPLANT
GLOVE SURG SYN 7.5  E (GLOVE) ×4
GLOVE SURG SYN 7.5 E (GLOVE) ×2 IMPLANT
GLOVE SURG SYN 7.5 PF PI (GLOVE) ×2 IMPLANT
GOWN STRL REUS W/TWL XL LVL3 (GOWN DISPOSABLE) ×6 IMPLANT
HEMOSTAT SURGICEL 2X4 FIBR (HEMOSTASIS) ×2 IMPLANT
ILLUMINATOR WAVEGUIDE N/F (MISCELLANEOUS) IMPLANT
KIT BASIN OR (CUSTOM PROCEDURE TRAY) ×2 IMPLANT
KIT TURNOVER KIT A (KITS) ×2 IMPLANT
NDL HYPO 25X1 1.5 SAFETY (NEEDLE) ×1 IMPLANT
NEEDLE HYPO 25X1 1.5 SAFETY (NEEDLE) ×2 IMPLANT
PACK BASIC VI WITH GOWN DISP (CUSTOM PROCEDURE TRAY) ×2 IMPLANT
PENCIL SMOKE EVACUATOR (MISCELLANEOUS) ×2 IMPLANT
SUT MNCRL AB 4-0 PS2 18 (SUTURE) ×2 IMPLANT
SUT VIC AB 3-0 SH 18 (SUTURE) ×2 IMPLANT
SYR BULB IRRIG 60ML STRL (SYRINGE) ×2 IMPLANT
SYR CONTROL 10ML LL (SYRINGE) ×2 IMPLANT
TOWEL OR 17X26 10 PK STRL BLUE (TOWEL DISPOSABLE) ×2 IMPLANT
TOWEL OR NON WOVEN STRL DISP B (DISPOSABLE) ×2 IMPLANT
TUBING CONNECTING 10 (TUBING) ×2 IMPLANT

## 2021-03-10 NOTE — Interval H&P Note (Signed)
History and Physical Interval Note:  03/10/2021 8:10 AM  Traci Johnson  has presented today for surgery, with the diagnosis of PRIMARY HYPERPARATHYROIDISM.  The various methods of treatment have been discussed with the patient and family. After consideration of risks, benefits and other options for treatment, the patient has consented to    Procedure(s): LEFT INFERIOR PARATHYROIDECTOMY (Left) as a surgical intervention.    The patient's history has been reviewed, patient examined, no change in status, stable for surgery.  I have reviewed the patient's chart and labs.  Questions were answered to the patient's satisfaction.    Armandina Gemma, [facility] [place] [facility] Office: [place]

## 2021-03-10 NOTE — Anesthesia Procedure Notes (Signed)
Procedure Name: Intubation Date/Time: 03/10/2021 8:29 AM Performed by: Eben Burow, CRNA Pre-anesthesia Checklist: Patient identified, Emergency Drugs available, Suction available and Patient being monitored Patient Re-evaluated:Patient Re-evaluated prior to induction Oxygen Delivery Method: Circle system utilized Preoxygenation: Pre-oxygenation with 100% oxygen Induction Type: IV induction Ventilation: Mask ventilation without difficulty Grade View: Grade I Tube type: Oral Tube size: 7.0 mm Number of attempts: 1 Airway Equipment and Method: Stylet and Oral airway Placement Confirmation: ETT inserted through vocal cords under direct vision, positive ETCO2 and breath sounds checked- equal and bilateral Secured at: 21 cm Tube secured with: Tape Dental Injury: Teeth and Oropharynx as per pre-operative assessment

## 2021-03-10 NOTE — Transfer of Care (Addendum)
Immediate Anesthesia Transfer of Care Note  Patient: Traci Johnson  Procedure(s) Performed: LEFT INFERIOR PARATHYROIDECTOMY (Left: Neck)  Patient Location: PACU  Anesthesia Type:General  Level of Consciousness: awake  Airway & Oxygen Therapy: Patient Spontanous Breathing and Patient connected to face mask oxygen  Post-op Assessment: Report given to RN and Post -op Vital signs reviewed and stable  Post vital signs: Reviewed and stable  Last Vitals:  Vitals Value Taken Time  BP 146/83 03/10/21 0933  Temp    Pulse 86 03/10/21 0935  Resp 17 03/10/21 0935  SpO2 94 % 03/10/21 0935  Vitals shown include unvalidated device data.  Last Pain:  Vitals:   03/10/21 0704  TempSrc:   PainSc: 0-No pain         Complications: No notable events documented.

## 2021-03-10 NOTE — Anesthesia Preprocedure Evaluation (Signed)
Anesthesia Evaluation  Patient identified by MRN, date of birth, ID band Patient awake  General Assessment Comment:Toxic appearing woman in mild distress  Reviewed: Allergy & Precautions, NPO status , Patient's Chart, lab work & pertinent test results  Airway Mallampati: III  TM Distance: <3 FB Neck ROM: Full  Mouth opening: Limited Mouth Opening  Dental no notable dental hx. (+) Teeth Intact   Pulmonary asthma , sleep apnea ,  Use inhalers daily   breath sounds clear to auscultation       Cardiovascular hypertension, Pt. on medications  Rhythm:Regular Rate:Tachycardia  Recent cardiac workup was neg   Neuro/Psych  Headaches, Depression    GI/Hepatic cholelithiasis   Endo/Other  Morbid obesity  Renal/GU negative Renal ROS     Musculoskeletal  (+) Arthritis , Osteoarthritis,    Abdominal (+) + obese,   Peds  Hematology   Anesthesia Other Findings   Reproductive/Obstetrics                             Anesthesia Physical  Anesthesia Plan  ASA: 2  Anesthesia Plan: General   Post-op Pain Management:    Induction: Intravenous  PONV Risk Score and Plan: 3 and Treatment may vary due to age or medical condition, Dexamethasone, Ondansetron and Midazolam  Airway Management Planned: Oral ETT  Additional Equipment:   Intra-op Plan:   Post-operative Plan: Extubation in OR  Informed Consent: I have reviewed the patients History and Physical, chart, labs and discussed the procedure including the risks, benefits and alternatives for the proposed anesthesia with the patient or authorized representative who has indicated his/her understanding and acceptance.     Dental advisory given  Plan Discussed with: CRNA  Anesthesia Plan Comments:         Anesthesia Quick Evaluation

## 2021-03-10 NOTE — Op Note (Signed)
OPERATIVE REPORT - PARATHYROIDECTOMY  Preoperative diagnosis: Primary hyperparathyroidism  Postop diagnosis: Same  Procedure: Left inferior minimally invasive parathyroidectomy  Surgeon:  Armandina Gemma, MD  Anesthesia: General endotracheal  Estimated blood loss: Minimal  Preparation: ChloraPrep  Indications: Patient returns for follow-up having undergone diagnostic studies related to primary hyperparathyroidism. Patient underwent an ultrasound examination of the neck on December 28, 2020. This demonstrated no suspicious thyroid nodules but also was not able to identify any evidence of an enlarged parathyroid gland. Patient subsequently underwent a nuclear medicine parathyroid scan which also showed no scintigraphic evidence of parathyroid adenoma. 24-hour urine collection for calcium was performed and did show urine calcium excretion in the upper range of normal at 293. Patient then underwent a 4D CT scan of the neck which revealed 2 areas on the left side which are suspicious for possible parathyroid adenoma. There is a 7 mm nodule on the lower pole of the thyroid which may be an exophytic thyroid nodule. There was a more suspicious lesion located just inferior to the left thyroid lobe and the superior mediastinal fat measuring 6 mm in size since showing homogeneous hyperenhancement.  Patient now comes to surgery for neck exploration and parathyroidectomy.  Procedure: The patient was prepared in the pre-operative holding area. The patient was brought to the operating room and placed in a supine position on the operating room table. Following administration of general anesthesia, the patient was positioned and then prepped and draped in the usual strict aseptic fashion. After ascertaining that an adequate level of anesthesia been achieved, a neck incision was made with a #15 blade. Dissection was carried through subcutaneous tissues and platysma. Hemostasis was obtained with the electrocautery. Skin  flaps were developed circumferentially and a Weitlander retractor was placed for exposure.  Strap muscles were incised in the midline. Strap muscles were reflected laterally exposing the thyroid lobe. With gentle blunt dissection the thyroid lobe was mobilized.  There is a 7 mm nodular mass adjacent to the inferior pole of the thyroid lobe.  This appears to be thyroid tissue.  Additional mobilization of the inferior pole and the lateral left thyroid lobe does not reveal any other enlarged parathyroid tissue.  The left thyroid thymic tract is then dissected out into the upper mediastinum.  The tract appears to contain parathyroid tissue.  Therefore the tract is dissected out, divided between ligaclips, and excised.  A proximately a 1 cm nodular mass is identified and submitted to pathology.  Frozen section by Dr. Claudette Laws confirms hypercellular parathyroid tissue consistent with parathyroid adenoma.  The small nodular density adjacent to the inferior thyroid pole was also dissected out.  Vascular structures are divided between small ligaclips and the nodule is excised.  It is sectioned on the operating room table and appears to be thyroid tissue.  It will be submitted separately for permanent review.  Neck was irrigated with warm saline and good hemostasis was noted. Fibrillar was placed in the operative field. Strap muscles were approximated in the midline with interrupted 3-0 Vicryl sutures. Platysma was closed with interrupted 3-0 Vicryl sutures. Marcaine was infiltrated circumferentially. Skin was closed with a running 4-0 Monocryl subcuticular suture. Wound was washed and dried and Dermabond was applied. Patient was awakened from anesthesia and brought to the recovery room. The patient tolerated the procedure well.   Armandina Gemma, MD [facility] Office: [phone]

## 2021-03-11 ENCOUNTER — Encounter (HOSPITAL_COMMUNITY): Payer: Self-pay | Admitting: Surgery

## 2021-03-11 LAB — SURGICAL PATHOLOGY

## 2021-03-11 NOTE — Anesthesia Postprocedure Evaluation (Signed)
Anesthesia Post Note  Patient: Traci Johnson  Procedure(s) Performed: LEFT INFERIOR PARATHYROIDECTOMY (Left: Neck)     Patient location during evaluation: PACU Anesthesia Type: General Level of consciousness: awake and alert Pain management: pain level controlled Vital Signs Assessment: post-procedure vital signs reviewed and stable Respiratory status: spontaneous breathing, nonlabored ventilation and respiratory function stable Cardiovascular status: blood pressure returned to baseline and stable Postop Assessment: no apparent nausea or vomiting Anesthetic complications: no   No notable events documented.  Last Vitals:  Vitals:   03/10/21 1215 03/10/21 1222  BP: (!) 152/91 (!) 144/76  Pulse: 86 86  Resp: 12 16  Temp:    SpO2: (!) 89% 94%    Last Pain:  Vitals:   03/10/21 1222  TempSrc:   PainSc: 0-No pain   Pain Goal:                   Lynda Rainwater

## 2021-03-22 DIAGNOSIS — Z23 Encounter for immunization: Secondary | ICD-10-CM | POA: Diagnosis not present

## 2021-03-29 DIAGNOSIS — E210 Primary hyperparathyroidism: Secondary | ICD-10-CM | POA: Diagnosis not present

## 2021-03-29 DIAGNOSIS — E892 Postprocedural hypoparathyroidism: Secondary | ICD-10-CM | POA: Diagnosis not present

## 2021-06-01 ENCOUNTER — Other Ambulatory Visit: Payer: Medicare Other | Admitting: Adult Health

## 2021-07-07 ENCOUNTER — Ambulatory Visit (INDEPENDENT_AMBULATORY_CARE_PROVIDER_SITE_OTHER): Payer: Medicare Other | Admitting: Adult Health

## 2021-07-07 ENCOUNTER — Other Ambulatory Visit: Payer: Self-pay

## 2021-07-07 ENCOUNTER — Encounter: Payer: Self-pay | Admitting: Adult Health

## 2021-07-07 VITALS — BP 128/76 | HR 92 | Ht 64.0 in | Wt 210.0 lb

## 2021-07-07 DIAGNOSIS — Z79899 Other long term (current) drug therapy: Secondary | ICD-10-CM | POA: Diagnosis not present

## 2021-07-07 DIAGNOSIS — N8189 Other female genital prolapse: Secondary | ICD-10-CM | POA: Diagnosis not present

## 2021-07-07 DIAGNOSIS — Z1211 Encounter for screening for malignant neoplasm of colon: Secondary | ICD-10-CM | POA: Diagnosis not present

## 2021-07-07 DIAGNOSIS — Z01419 Encounter for gynecological examination (general) (routine) without abnormal findings: Secondary | ICD-10-CM

## 2021-07-07 DIAGNOSIS — N816 Rectocele: Secondary | ICD-10-CM

## 2021-07-07 DIAGNOSIS — Z90710 Acquired absence of both cervix and uterus: Secondary | ICD-10-CM | POA: Insufficient documentation

## 2021-07-07 LAB — HEMOCCULT GUIAC POC 1CARD (OFFICE): Fecal Occult Blood, POC: NEGATIVE

## 2021-07-07 MED ORDER — ESTRADIOL 1 MG PO TABS: 1.0000 mg | ORAL_TABLET | Freq: Every day | ORAL | 3 refills | Status: DC

## 2021-07-07 NOTE — Progress Notes (Signed)
Patient ID: Traci Johnson, female   DOB: 08-02-1950, 71 y.o.   MRN: [id number] History of Present Illness: Traci Johnson is a 71 year old white female, married, sp hysterectomy on estrace in for well woman gyn exam. PCP is Pablo Lawrence NP    Current Medications, Allergies, Past Medical History, Past Surgical History, Family History and Social History were reviewed in [employer] record.     Review of Systems:  Patient denies any headaches, hearing loss, fatigue, blurred vision, shortness of breath, chest pain, abdominal pain, problems with bowel movements, urination, or intercourse.(Not often). No joint pain or mood swings.  Has body aches, and still gets hot flashes Has had knee and hip surgery   Physical Exam:BP 128/76 (BP Location: Left Arm, Patient Position: Sitting, Cuff Size: Large)    Pulse 92    Ht 5\' 4"  (1.626 m)    Wt 210 lb (95.3 kg)    BMI 36.05 kg/m   General:  Well developed, well nourished, no acute distress Skin:  Warm and dry Neck:  Midline trachea, normal thyroid, good ROM, no lymphadenopathy,no carotid bruits heard  Lungs; Clear to auscultation bilaterally Breast:  No dominant palpable mass, retraction, or nipple discharge Cardiovascular: Regular rate and rhythm Abdomen:  Soft, non tender, no hepatosplenomegaly Pelvic:  External genitalia is normal in appearance, has multiple angiokeratomas.  The vagina is pale with loss of rugae.Has relaxation. Urethra has no lesions or masses. The cervix and uterus are absent.  No adnexal masses or tenderness noted.Bladder is non tender, no masses felt. Rectal: Good sphincter tone, no polyps, or hemorrhoids felt.  Hemoccult negative.+rectocele Extremities/musculoskeletal:  No swelling or varicosities noted, no clubbing or cyanosis Psych:  No mood changes, alert and cooperative,seems happy AA is 1 Fall risk is moderate Depression screen [facility] 2/9 07/07/2021 05/13/2020 05/06/2019  Decreased Interest 2 2 0  Down,  Depressed, Hopeless 1 3 0  PHQ - 2 Score 3 5 0  Altered sleeping 2 2 -  Tired, decreased energy 1 3 -  Change in appetite 0 1 -  Feeling bad or failure about yourself  0 2 -  Trouble concentrating 0 0 -  Moving slowly or fidgety/restless 0 0 -  Suicidal thoughts 0 0 -  PHQ-9 Score 6 13 -    GAD 7 : Generalized Anxiety Score 07/07/2021 05/13/2020  Nervous, Anxious, on Edge 1 1  Control/stop worrying 1 0  Worry too much - different things 1 1  Trouble relaxing 0 2  Restless 0 0  Easily annoyed or irritable 2 2  Afraid - awful might happen 0 0  Total GAD 7 Score 5 6      Upstream - 07/07/21 1143       Pregnancy Intention Screening   Does the patient want to become pregnant in the next year? N/A    Does the patient's partner want to become pregnant in the next year? N/A    Would the patient like to discuss contraceptive options today? N/A      Contraception Wrap Up   Current Method Female Sterilization   hyst   End Method Female Sterilization   hyst   Contraception Counseling Provided No             Examination chaperoned by Levy Pupa LPN  Impression and Plan: 1. Encounter for well woman exam with routine gynecological exam Physical with PCP Labs with PCP Mammogram yearly Colonoscopy per GI Follow up in 2 years for GYN physical  2. Current use of estrogen therapy She is aware of risks and benefits and wants to continue, will refill estrace 1 mg  Meds ordered this encounter  Medications   estradiol (ESTRACE) 1 MG tablet    Sig: Take 1 tablet (1 mg total) by mouth daily.    Dispense:  90 tablet    Refill:  3    Order Specific Question:   Supervising Provider    Answer:   Elonda Husky, LUTHER H [[id number]]     3. Encounter for screening fecal occult blood testing  4. Pelvic relaxation  5. Rectocele  6. S/P hysterectomy Np pap needed

## 2021-08-17 NOTE — Progress Notes (Signed)
?Traci Johnson D.O. ?[facility] ?[place] ?Phone: [phone] ?Subjective:   ?I, Traci Johnson, am serving as a scribe for Dr. Hulan Saas. ? ?This visit occurred during the SARS-CoV-2 public health emergency.  Safety protocols were in place, including screening questions prior to the visit, additional usage of staff PPE, and extensive cleaning of exam room while observing appropriate contact time as indicated for disinfecting solutions.  ? ?I'm seeing this patient by the request  of:  Pablo Lawrence, NP ? ?CC: Multiple joint complaints ? ?[id number]  ?Traci Johnson is a 71 y.o. female coming in with complaint of R hip and knee pain. Patient states L leg is shorter than R leg and is causing constant back and SI joint pain. Denies any radiating symptoms. Constant knee pain despite doing a lot of physical therapy. Patient is going on vacation in June. Uses meloxicam daily and tramadol prn.  ? ?Tore R meniscus 2019; Nov 2020 partial knee replacement; Feb 2021 L TKR; Feb 2022 R hip replacement; Oct 2022 tumor removed thyroid ? ? ? ? ?  ? ?Past Medical History:  ?Diagnosis Date  ? Allergy   ? Arthritis   ? Asthma   ? Chronic fatigue   ? COVID-19   ? Depression   ? Hyperparathyroidism ([place])   ? Hypertension   ? Migraines   ? cluster  ? Sleep apnea   ? ?Past Surgical History:  ?Procedure Laterality Date  ? ABDOMINAL HYSTERECTOMY  1985  ? CHOLECYSTECTOMY N/A 05/02/2017  ? Procedure: LAPAROSCOPIC CHOLECYSTECTOMY;  Surgeon: Kinsinger, Arta Bruce, MD;  Location: [facility];  Service: General;  Laterality: N/A;  ? LEFT HEART CATH AND CORONARY ANGIOGRAPHY N/A 04/30/2017  ? Procedure: LEFT HEART CATH AND CORONARY ANGIOGRAPHY;  Surgeon: Lorretta Harp, MD;  Location: [facility];  Service: Cardiovascular;  Laterality: N/A;  ? 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 PARATHYROIDECTOMY Left 03/10/2021  ? Procedure: LEFT INFERIOR PARATHYROIDECTOMY;  Surgeon: Armandina Gemma, MD;   Location: [facility];  Service: General;  Laterality: Left;  ? REPLACEMENT TOTAL KNEE BILATERAL    ? TOTAL HIP ARTHROPLASTY Right   ? ?Social History  ? ?Socioeconomic History  ? Marital status: Married  ?  Spouse name: Not on file  ? Number of children: Not on file  ? Years of education: Not on file  ? Highest education level: Not on file  ?Occupational History  ? Occupation: Solicitor  ?Tobacco Use  ? Smoking status: Never  ? Smokeless tobacco: Never  ?Vaping Use  ? Vaping Use: Never used  ?Substance and Sexual Activity  ? Alcohol use: Yes  ?  Alcohol/week: 0.0 standard drinks  ?  Comment: rare  ? Drug use: Never  ? Sexual activity: Not Currently  ?  Birth control/protection: Surgical  ?  Comment: hysto  ?Other Topics Concern  ? Not on file  ?Social History Narrative  ? ** Merged History Encounter **  ?    ? ?Social Determinants of Health  ? ?Financial Resource Strain: Low Risk   ? Difficulty of Paying Living Expenses: Not very hard  ?Food Insecurity: No Food Insecurity  ? Worried About Charity fundraiser in the Last Year: Never true  ? Ran Out of Food in the Last Year: Never true  ?Transportation Needs: No Transportation Needs  ? Lack of Transportation (Medical): No  ? Lack of Transportation (Non-Medical): No  ?Physical Activity: Insufficiently Active  ? Days  of Exercise per Week: 1 day  ? Minutes of Exercise per Session: 20 min  ?Stress: No Stress Concern Present  ? Feeling of Stress : Only a little  ?Social Connections: Socially Integrated  ? Frequency of Communication with Friends and Family: More than three times a week  ? Frequency of Social Gatherings with Friends and Family: Once a week  ? Attends Religious Services: More than 4 times per year  ? Active Member of Clubs or Organizations: Yes  ? Attends Archivist Meetings: More than 4 times per year  ? Marital Status: Married  ? ?Allergies  ?Allergen Reactions  ? Codeine Nausea And Vomiting  ? Lisinopril Cough  ? ?Family History  ?Problem  Relation Age of Onset  ? Cancer Paternal Grandfather   ? Heart disease Paternal Grandmother   ? Cancer Maternal Grandfather   ? Cancer Father   ? Dementia Mother   ? Cancer Mother   ? Hypertension Brother   ? Cancer Son   ?     testicular & lung  ? CAD Neg Hx   ? ? ?Current Outpatient Medications (Endocrine & Metabolic):  ?  estradiol (ESTRACE) 1 MG tablet, Take 1 tablet (1 mg total) by mouth daily. ? ?Current Outpatient Medications (Cardiovascular):  ?  amLODipine (NORVASC) 5 MG tablet, Take 5 mg by mouth daily. ?  hydrochlorothiazide (HYDRODIURIL) 25 MG tablet, Take 25 mg by mouth daily. ?  metoprolol succinate (TOPROL-XL) 25 MG 24 hr tablet, Take 12.5 mg by mouth daily. ? ?Current Outpatient Medications (Respiratory):  ?  albuterol (PROVENTIL HFA;VENTOLIN HFA) 108 (90 BASE) MCG/ACT inhaler, Inhale 2 puffs into the lungs every 6 (six) hours as needed for wheezing. ?  fluticasone-salmeterol (ADVAIR) 250-50 MCG/ACT AEPB, Inhale 1 puff into the lungs in the morning and at bedtime. ? ?Current Outpatient Medications (Analgesics):  ?  aspirin 81 MG tablet, Take 81 mg by mouth daily. ?  meloxicam (MOBIC) 15 MG tablet, TAKE ONE TABLET BY MOUTH ONCE DAILY ?  traMADol (ULTRAM) 50 MG tablet, Take 1-2 tablets (50-100 mg total) by mouth every 6 (six) hours as needed for moderate pain. ? ?Current Outpatient Medications (Hematological):  ?  Ferrous Gluconate (IRON 27 PO), Take 27 mg by mouth daily. ? ?Current Outpatient Medications (Other):  ?  Cholecalciferol (VITAMIN D3) 5000 units CAPS, Take 5,000 Units by mouth daily. ?  cyclobenzaprine (FLEXERIL) 5 MG tablet, TAKE ONE TABLET BY MOUTH THREE TIMES DAILY AS NEEDED FOR MUSCLE SPASM ?  escitalopram (LEXAPRO) 5 MG tablet, Take 5 mg by mouth daily. ?  gabapentin (NEURONTIN) 100 MG capsule, Take 2 capsules (200 mg total) by mouth at bedtime. ?  OVER THE COUNTER MEDICATION, Take 1 capsule by mouth in the morning and at bedtime. Super Beets ?  Potassium 99 MG TABS, Take 1 tablet by  mouth daily. ?  Probiotic Product (PROBIOTIC PO), Take by mouth. ?  Vitamin A 2400 MCG (8000 UT) CAPS, Take 8,000 Units by mouth daily. ?  vitamin E 180 MG (400 UNITS) capsule, Take 400 Units by mouth daily. ?  zinc gluconate 50 MG tablet, Take 50 mg by mouth daily. ? ? ?Reviewed prior external information including notes and imaging from  ?primary care provider ?As well as notes that were available from care everywhere and other healthcare systems. ? ?Past medical history, social, surgical and family history all reviewed in electronic medical record.  No pertanent information unless stated regarding to the chief complaint.  ? ?Review of Systems: ?  No headache, visual changes, nausea, vomiting, diarrhea, constipation, dizziness, abdominal pain, skin rash, fevers, chills, night sweats, weight loss, swollen lymph nodes, body aches, joint swelling, chest pain, shortness of breath, mood changes. POSITIVE muscle aches ? ?Objective  ?Blood pressure (!) 142/72, pulse (!) 106, height '5\' 4"'$  (1.626 m), weight 214 lb (97.1 kg), SpO2 97 %. ?  ?General: No apparent distress alert and oriented x3 mood and affect normal, dressed appropriately.  ?HEENT: Pupils equal, extraocular movements intact  ?Respiratory: Patient's speak in full sentences and does not appear short of breath  ?Cardiovascular: No lower extremity edema, non tender, no erythema  ?Gait antalgic gait with weakness noted with the core strength ?MSK: Tender to palpation diffusely.  Significant tightness noted with hip abductor as well as FABER test right greater than left.  Patient does have weakness noted of the hip abductors with walking again.  Tightness noted with straight leg test.  Only 5 degrees of extension of the back with worsening pain. ? ? ?  ?Impression and Recommendations:  ?  ? ?The above documentation has been reviewed and is accurate and complete Lyndal Pulley, DO ? ? ? ?

## 2021-08-23 ENCOUNTER — Other Ambulatory Visit: Payer: Self-pay

## 2021-08-23 ENCOUNTER — Ambulatory Visit: Payer: Self-pay

## 2021-08-23 ENCOUNTER — Ambulatory Visit: Payer: Medicare Other | Admitting: Family Medicine

## 2021-08-23 ENCOUNTER — Ambulatory Visit (INDEPENDENT_AMBULATORY_CARE_PROVIDER_SITE_OTHER): Payer: Medicare Other

## 2021-08-23 VITALS — BP 142/72 | HR 106 | Ht 64.0 in | Wt 214.0 lb

## 2021-08-23 DIAGNOSIS — M2170 Unequal limb length (acquired), unspecified site: Secondary | ICD-10-CM | POA: Insufficient documentation

## 2021-08-23 DIAGNOSIS — M25561 Pain in right knee: Secondary | ICD-10-CM | POA: Diagnosis not present

## 2021-08-23 DIAGNOSIS — M545 Low back pain, unspecified: Secondary | ICD-10-CM | POA: Diagnosis not present

## 2021-08-23 DIAGNOSIS — E210 Primary hyperparathyroidism: Secondary | ICD-10-CM

## 2021-08-23 DIAGNOSIS — M48061 Spinal stenosis, lumbar region without neurogenic claudication: Secondary | ICD-10-CM | POA: Insufficient documentation

## 2021-08-23 MED ORDER — GABAPENTIN 100 MG PO CAPS: 200.0000 mg | ORAL_CAPSULE | Freq: Every day | ORAL | 0 refills | Status: DC

## 2021-08-23 NOTE — Assessment & Plan Note (Signed)
Patient is awaiting for her follow-up.  We did discuss the potential for laboratory work-up.  Patient will be getting it from an outside facility and then we will see if anything else needs to be done otherwise. ?

## 2021-08-23 NOTE — Patient Instructions (Addendum)
Xray today ?Exercises ?Gabapentin '200mg'$  at night ?Turmeric '500mg'$  daily ?Tart cherry extract '1200mg'$  at night ?See me in 6 weeks ?

## 2021-08-23 NOTE — Assessment & Plan Note (Signed)
Patient does have significant leg length discrepancy noted with the right side being shorter.  Does have a lift on her shoe at this time.  Patient does walk with some mild weakness noted of the legs that appears as well with weakness of the hip abductors.  Concern for underlying arthritic changes of the back that could be contributing.  Patient will get x-rays today, discussed gabapentin, home exercises given as well.  Patient wants to avoid formal physical therapy because she has done significant amount of this over 4 years.  Think we need to consider the possibility of an MRI of the back as well if this continues.  Follow-up with me again 4 to 6 weeks ?

## 2021-08-23 NOTE — Assessment & Plan Note (Signed)
Concern for underlying lumbar spinal still stenosis.  Discussed icing regimen and home exercises, which activities to do and which ones to avoid, increase activity slowly.  Follow-up again in 6 to 8 weeks.  Consider laboratory work-up as well as advanced imaging if patient continues to have difficulty. ?

## 2021-09-07 ENCOUNTER — Telehealth: Payer: Self-pay | Admitting: Family Medicine

## 2021-09-07 NOTE — Telephone Encounter (Signed)
At last visit per pt, Dr. Tamala Julian wanted pt to get Calcium labs at her next appt with thyroid doctor. Per pt, thyroid doctor no longer needs to see this patient. ? ?Do we want to order the calcium labs to be done before her next appt May 8? ? ?Pt can see PCP, but unsure how long it would be to get an appt. ?

## 2021-09-12 ENCOUNTER — Encounter: Payer: Self-pay | Admitting: Family Medicine

## 2021-09-12 ENCOUNTER — Other Ambulatory Visit: Payer: Self-pay

## 2021-09-12 DIAGNOSIS — M2550 Pain in unspecified joint: Secondary | ICD-10-CM

## 2021-09-12 NOTE — Telephone Encounter (Signed)
Labs ordered. Patient notified.

## 2021-09-12 NOTE — Telephone Encounter (Signed)
Maybe days before her appointment jif she does not mind coming in we can check  ?Ionized calcium., PTH, TSH, free T3, T4, Vit D ?If we could put orders in as well for future that would be great  ?Thank you! ?

## 2021-09-19 ENCOUNTER — Other Ambulatory Visit: Payer: Self-pay

## 2021-09-19 MED ORDER — ESTRADIOL 1 MG PO TABS: 1.0000 mg | ORAL_TABLET | Freq: Every day | ORAL | 0 refills | Status: DC

## 2021-09-20 ENCOUNTER — Other Ambulatory Visit: Payer: Self-pay | Admitting: *Deleted

## 2021-09-20 MED ORDER — ESTRADIOL 1 MG PO TABS: 1.0000 mg | ORAL_TABLET | Freq: Every day | ORAL | 0 refills | Status: DC

## 2021-09-27 ENCOUNTER — Other Ambulatory Visit (INDEPENDENT_AMBULATORY_CARE_PROVIDER_SITE_OTHER): Payer: Medicare Other

## 2021-09-27 DIAGNOSIS — M2550 Pain in unspecified joint: Secondary | ICD-10-CM | POA: Diagnosis not present

## 2021-09-27 LAB — TSH: TSH: 1.87 u[IU]/mL (ref 0.35–5.50)

## 2021-09-27 LAB — T4, FREE: Free T4: 0.8 ng/dL (ref 0.60–1.60)

## 2021-09-27 LAB — T3, FREE: T3, Free: 2.7 pg/mL (ref 2.3–4.2)

## 2021-09-27 LAB — VITAMIN D 25 HYDROXY (VIT D DEFICIENCY, FRACTURES): VITD: 32.64 ng/mL (ref 30.00–100.00)

## 2021-09-28 LAB — PTH, INTACT AND CALCIUM
Calcium: 9.1 mg/dL (ref 8.6–10.4)
PTH: 48 pg/mL (ref 16–77)

## 2021-09-28 LAB — CALCIUM, IONIZED: Calcium, Ion: 4.8 mg/dL (ref 4.7–5.5)

## 2021-09-29 NOTE — Progress Notes (Signed)
?Traci Johnson D.O. ?[facility] ?[place] ?Phone: [phone] ?Subjective:   ?I, Traci Johnson, am serving as a scribe for Dr. Hulan Saas. ? ?This visit occurred during the SARS-CoV-2 public health emergency.  Safety protocols were in place, including screening questions prior to the visit, additional usage of staff PPE, and extensive cleaning of exam room while observing appropriate contact time as indicated for disinfecting solutions.  ? ? ?I'm seeing this patient by the request  of:  Pablo Lawrence, NP ? ?CC: Low back pain and hip pain follow-up ? ?[id number]  ?08/23/2021 ?Concern for underlying lumbar spinal still stenosis.  Discussed icing regimen and home exercises, which activities to do and which ones to avoid, increase activity slowly.  Follow-up again in 6 to 8 weeks.  Consider laboratory work-up as well as advanced imaging if patient continues to have difficulty. ? ?Patient does have significant leg length discrepancy noted with the right side being shorter.  Does have a lift on her shoe at this time.  Patient does walk with some mild weakness noted of the legs that appears as well with weakness of the hip abductors.  Concern for underlying arthritic changes of the back that could be contributing.  Patient will get x-rays today, discussed gabapentin, home exercises given as well.  Patient wants to avoid formal physical therapy because she has done significant amount of this over 4 years.  Think we need to consider the possibility of an MRI of the back as well if this continues.  Follow-up with me again 4 to 6 weeks ? ?Update 10/10/2021 ?Traci Johnson is a 71 y.o. female coming in with complaint of lumbar spine pain. Patient would like to discuss labwork. Patient states that her back and L hip are throbbing and slightly worse. Pain more with standing.  Patient states is still affecting daily activities, unable to do anything significant secondary to the  discomfort and pain, is having to take the tramadol more than she would like. ? ? ?  ? ?Past Medical History:  ?Diagnosis Date  ? Allergy   ? Arthritis   ? Asthma   ? Chronic fatigue   ? COVID-19   ? Depression   ? Hyperparathyroidism ([place])   ? Hypertension   ? Migraines   ? cluster  ? Sleep apnea   ? ?Past Surgical History:  ?Procedure Laterality Date  ? ABDOMINAL HYSTERECTOMY  1985  ? CHOLECYSTECTOMY N/A 05/02/2017  ? Procedure: LAPAROSCOPIC CHOLECYSTECTOMY;  Surgeon: Kinsinger, Arta Bruce, MD;  Location: [facility];  Service: General;  Laterality: N/A;  ? LEFT HEART CATH AND CORONARY ANGIOGRAPHY N/A 04/30/2017  ? Procedure: LEFT HEART CATH AND CORONARY ANGIOGRAPHY;  Surgeon: Lorretta Harp, MD;  Location: [facility];  Service: Cardiovascular;  Laterality: N/A;  ? 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 PARATHYROIDECTOMY Left 03/10/2021  ? Procedure: LEFT INFERIOR PARATHYROIDECTOMY;  Surgeon: Armandina Gemma, MD;  Location: [facility];  Service: General;  Laterality: Left;  ? REPLACEMENT TOTAL KNEE BILATERAL    ? TOTAL HIP ARTHROPLASTY Right   ? ?Social History  ? ?Socioeconomic History  ? Marital status: Married  ?  Spouse name: Not on file  ? Number of children: Not on file  ? Years of education: Not on file  ? Highest education level: Not on file  ?Occupational History  ? Occupation: Solicitor  ?Tobacco Use  ? Smoking status: Never  ? Smokeless tobacco: Never  ?Vaping Use  ?  Vaping Use: Never used  ?Substance and Sexual Activity  ? Alcohol use: Yes  ?  Alcohol/week: 0.0 standard drinks  ?  Comment: rare  ? Drug use: Never  ? Sexual activity: Not Currently  ?  Birth control/protection: Surgical  ?  Comment: hysto  ?Other Topics Concern  ? Not on file  ?Social History Narrative  ? ** Merged History Encounter **  ?    ? ?Social Determinants of Health  ? ?Financial Resource Strain: Low Risk   ? Difficulty of Paying Living Expenses: Not very hard  ?Food Insecurity: No Food Insecurity  ? Worried About  Charity fundraiser in the Last Year: Never true  ? Ran Out of Food in the Last Year: Never true  ?Transportation Needs: No Transportation Needs  ? Lack of Transportation (Medical): No  ? Lack of Transportation (Non-Medical): No  ?Physical Activity: Insufficiently Active  ? Days of Exercise per Week: 1 day  ? Minutes of Exercise per Session: 20 min  ?Stress: No Stress Concern Present  ? Feeling of Stress : Only a little  ?Social Connections: Socially Integrated  ? Frequency of Communication with Friends and Family: More than three times a week  ? Frequency of Social Gatherings with Friends and Family: Once a week  ? Attends Religious Services: More than 4 times per year  ? Active Member of Clubs or Organizations: Yes  ? Attends Archivist Meetings: More than 4 times per year  ? Marital Status: Married  ? ?Allergies  ?Allergen Reactions  ? Codeine Nausea And Vomiting  ? Lisinopril Cough  ? ?Family History  ?Problem Relation Age of Onset  ? Cancer Paternal Grandfather   ? Heart disease Paternal Grandmother   ? Cancer Maternal Grandfather   ? Cancer Father   ? Dementia Mother   ? Cancer Mother   ? Hypertension Brother   ? Cancer Son   ?     testicular & lung  ? CAD Neg Hx   ? ? ?Current Outpatient Medications (Endocrine & Metabolic):  ?  estradiol (ESTRACE) 1 MG tablet, Take 1 tablet (1 mg total) by mouth daily. ? ?Current Outpatient Medications (Cardiovascular):  ?  amLODipine (NORVASC) 5 MG tablet, Take 5 mg by mouth daily. ?  hydrochlorothiazide (HYDRODIURIL) 25 MG tablet, Take 25 mg by mouth daily. ?  metoprolol succinate (TOPROL-XL) 25 MG 24 hr tablet, Take 12.5 mg by mouth daily. ? ?Current Outpatient Medications (Respiratory):  ?  albuterol (PROVENTIL HFA;VENTOLIN HFA) 108 (90 BASE) MCG/ACT inhaler, Inhale 2 puffs into the lungs every 6 (six) hours as needed for wheezing. ?  fluticasone-salmeterol (ADVAIR) 250-50 MCG/ACT AEPB, Inhale 1 puff into the lungs in the morning and at bedtime. ? ?Current  Outpatient Medications (Analgesics):  ?  aspirin 81 MG tablet, Take 81 mg by mouth daily. ?  meloxicam (MOBIC) 15 MG tablet, TAKE ONE TABLET BY MOUTH ONCE DAILY ?  traMADol (ULTRAM) 50 MG tablet, Take 1-2 tablets (50-100 mg total) by mouth every 6 (six) hours as needed for moderate pain. ? ?Current Outpatient Medications (Hematological):  ?  Ferrous Gluconate (IRON 27 PO), Take 27 mg by mouth daily. ? ?Current Outpatient Medications (Other):  ?  Cholecalciferol (VITAMIN D3) 5000 units CAPS, Take 5,000 Units by mouth daily. ?  cyclobenzaprine (FLEXERIL) 5 MG tablet, TAKE ONE TABLET BY MOUTH THREE TIMES DAILY AS NEEDED FOR MUSCLE SPASM ?  escitalopram (LEXAPRO) 5 MG tablet, Take 5 mg by mouth daily. ?  gabapentin (NEURONTIN)  100 MG capsule, Take 2 capsules (200 mg total) by mouth at bedtime. ?  OVER THE COUNTER MEDICATION, Take 1 capsule by mouth in the morning and at bedtime. Super Beets ?  Potassium 99 MG TABS, Take 1 tablet by mouth daily. ?  Probiotic Product (PROBIOTIC PO), Take by mouth. ?  Vitamin A 2400 MCG (8000 UT) CAPS, Take 8,000 Units by mouth daily. ?  Vitamin D, Ergocalciferol, (DRISDOL) 1.25 MG (50000 UNIT) CAPS capsule, Take 1 capsule (50,000 Units total) by mouth every 7 (seven) days. ?  vitamin E 180 MG (400 UNITS) capsule, Take 400 Units by mouth daily. ?  zinc gluconate 50 MG tablet, Take 50 mg by mouth daily. ? ? ?Reviewed prior external information including notes and imaging from  ?primary care provider ?As well as notes that were available from care everywhere and other healthcare systems. ? ?Past medical history, social, surgical and family history all reviewed in electronic medical record.  No pertanent information unless stated regarding to the chief complaint.  ? ?Review of Systems: ? No headache, visual changes, nausea, vomiting, diarrhea, constipation, dizziness, abdominal pain, skin rash, fevers, chills, night sweats, weight loss, swollen lymph nodes, body aches, joint swelling, chest  pain, shortness of breath, mood changes. POSITIVE muscle aches ? ?Objective  ?Blood pressure 130/74, pulse 73, height '5\' 4"'$  (1.626 m), weight 216 lb (98 kg), SpO2 98 %. ?  ?General: No apparent distress alert and orie

## 2021-10-10 ENCOUNTER — Encounter: Payer: Self-pay | Admitting: Family Medicine

## 2021-10-10 ENCOUNTER — Ambulatory Visit: Payer: Medicare Other | Admitting: Family Medicine

## 2021-10-10 DIAGNOSIS — E559 Vitamin D deficiency, unspecified: Secondary | ICD-10-CM

## 2021-10-10 DIAGNOSIS — M48061 Spinal stenosis, lumbar region without neurogenic claudication: Secondary | ICD-10-CM | POA: Diagnosis not present

## 2021-10-10 MED ORDER — VITAMIN D (ERGOCALCIFEROL) 1.25 MG (50000 UNIT) PO CAPS: 50000.0000 [IU] | ORAL_CAPSULE | ORAL | 0 refills | Status: DC

## 2021-10-10 NOTE — Patient Instructions (Addendum)
MRI lumbar triad open ?Take tylenol with tramadol ?Keep using lift ?Vit D stop daily, take prescription instead ? ? ?When we receive your results we will contact you. ? ?

## 2021-10-10 NOTE — Assessment & Plan Note (Signed)
Once weekly vitamin D prescribed today.  Recheck in 12 weeks ?

## 2021-10-10 NOTE — Assessment & Plan Note (Signed)
Significant concern for spinal stenosis.  Does have a leg length discrepancy but have fixed that.  Continue the gabapentin.  We will get MRI and could be a candidate for epidurals.  Patient does have claustrophobia and will try the open MRI. ?Patient does have tramadol from primary care provider and will try taking this with Tylenol as well.  Discussed which activities to do which ones to avoid.  Follow-up again in 6 to 8 weeks ?

## 2021-10-23 ENCOUNTER — Other Ambulatory Visit: Payer: Self-pay | Admitting: Family Medicine

## 2021-10-25 ENCOUNTER — Telehealth: Payer: Self-pay | Admitting: Family Medicine

## 2021-10-25 NOTE — Telephone Encounter (Signed)
Patient called stating that she dropped off a copy of her MRI last week and wanted to know if Dr Tamala Julian had a chance to review it and what his thoughts were?  Please advise.

## 2021-10-26 ENCOUNTER — Other Ambulatory Visit: Payer: Self-pay

## 2021-10-26 DIAGNOSIS — M5459 Other low back pain: Secondary | ICD-10-CM

## 2021-10-26 NOTE — Telephone Encounter (Signed)
Lets do L3/4 facet injections and see

## 2021-10-26 NOTE — Telephone Encounter (Signed)
Yes I do have the report from her MRI.  Overall mild arthritis.  Patient noted does have L3-L4 facet effusions as well as L4-L5 and even mild at L5-S1 no significant changes from previous MRI in 2021  Due to the tightness and weakness noted mostly of the hip abductors would consider the possibility of L3-L4 facet injections or the possibility of an L3-L4 epidural for patient's symptomatology.  If patient is okay with this immune discussed with her you can order.

## 2021-10-26 NOTE — Telephone Encounter (Signed)
Facet injections ordered. Patient notified.

## 2021-11-02 ENCOUNTER — Ambulatory Visit
Admission: RE | Admit: 2021-11-02 | Discharge: 2021-11-02 | Disposition: A | Discharge status: Home / Self Care | Payer: Medicare Other | Source: Ambulatory Visit | Attending: Family Medicine | Admitting: Family Medicine

## 2021-11-02 DIAGNOSIS — M5459 Other low back pain: Secondary | ICD-10-CM

## 2021-11-02 MED ORDER — METHYLPREDNISOLONE ACETATE 40 MG/ML INJ SUSP (RADIOLOG
80.0000 mg | Freq: Once | INTRAMUSCULAR | Status: AC
  Administered 2021-11-02: 80 mg via INTRA_ARTICULAR

## 2021-11-02 MED ORDER — IOPAMIDOL (ISOVUE-M 200) INJECTION 41%
1.0000 mL | Freq: Once | INTRAMUSCULAR | Status: AC
  Administered 2021-11-02: 1 mL via INTRA_ARTICULAR

## 2021-11-02 NOTE — Discharge Instructions (Signed)

## 2021-11-30 ENCOUNTER — Other Ambulatory Visit (HOSPITAL_COMMUNITY): Payer: Self-pay | Admitting: Adult Health Nurse Practitioner

## 2021-11-30 DIAGNOSIS — Z1231 Encounter for screening mammogram for malignant neoplasm of breast: Secondary | ICD-10-CM

## 2021-12-01 NOTE — Progress Notes (Signed)
[name] [address] [place] Phone: [phone] Subjective:   [name], am serving as a scribe for Dr. Hulan Saas.  I'm seeing this patient by the request  of:  Pablo Lawrence, NP  CC: Back pain follow-up  [id number]  10/10/2021 Significant concern for spinal stenosis.  Does have a leg length discrepancy but have fixed that.  Continue the gabapentin.  We will get MRI and could be a candidate for epidurals.  Patient does have claustrophobia and will try the open MRI. Patient does have tramadol from primary care provider and will try taking this with Tylenol as well.  Discussed which activities to do which ones to avoid.  Follow-up again in 6 to 8 weeks  Update 12/02/2021 Traci Johnson is a 71 y.o. female coming in with complaint of lumbar spinal stenosis. Patient states hips are feeling better. Did a lot of walking on vacation and little hip pain. Everything else is bothersome especially knees. No other complaints. Currently having a back spasm. Sometimes feels like she has radiation of the pain as well.  Because she was traveling so much things of that has exacerbated everything.  Now having muscle spasms that is keeping her up at night.      Past Medical History:  Diagnosis Date   Allergy    Arthritis    Asthma    Chronic fatigue    COVID-19    Depression    Hyperparathyroidism ([place])    Hypertension    Migraines    cluster   Sleep apnea    Past Surgical History:  Procedure Laterality Date   ABDOMINAL HYSTERECTOMY  1985   CHOLECYSTECTOMY N/A 05/02/2017   Procedure: LAPAROSCOPIC CHOLECYSTECTOMY;  Surgeon: Mickeal Skinner, MD;  Location: [facility];  Service: General;  Laterality: N/A;   LEFT HEART CATH AND CORONARY ANGIOGRAPHY N/A 04/30/2017   Procedure: LEFT HEART CATH AND CORONARY ANGIOGRAPHY;  Surgeon: Lorretta Harp, MD;  Location: [facility];  Service: Cardiovascular;  Laterality: N/A;    LIGAMENT REPAIR Right    birth defect   PARATHYROIDECTOMY Left 03/10/2021   Procedure: LEFT INFERIOR PARATHYROIDECTOMY;  Surgeon: Armandina Gemma, MD;  Location: [facility];  Service: General;  Laterality: Left;   REPLACEMENT TOTAL KNEE BILATERAL     TOTAL HIP ARTHROPLASTY Right    Social History   Socioeconomic History   Marital status: Married    Spouse name: Not on file   Number of children: Not on file   Years of education: Not on file   Highest education level: Not on file  Occupational History   Occupation: Solicitor  Tobacco Use   Smoking status: Never   Smokeless tobacco: Never  Vaping Use   Vaping Use: Never used  Substance and Sexual Activity   Alcohol use: Yes    Alcohol/week: 0.0 standard drinks of alcohol    Comment: rare   Drug use: Never   Sexual activity: Not Currently    Birth control/protection: Surgical    Comment: hysto  Other Topics Concern   Not on file  Social History Narrative   ** Merged History Encounter **       Social Determinants of Health   Financial Resource Strain: Low Risk  (07/07/2021)   Overall Financial Resource Strain (CARDIA)    Difficulty of Paying Living Expenses: Not very hard  Food Insecurity: No Food Insecurity (07/07/2021)   Hunger Vital Sign    Worried About Running Out  of Food in the Last Year: Never true    [place] in the Last Year: Never true  Transportation Needs: No Transportation Needs (07/07/2021)   PRAPARE - Hydrologist (Medical): No    Lack of Transportation (Non-Medical): No  Physical Activity: Insufficiently Active (07/07/2021)   Exercise Vital Sign    Days of Exercise per Week: 1 day    Minutes of Exercise per Session: 20 min  Stress: No Stress Concern Present (07/07/2021)   [place]    Feeling of Stress : Only a little  Social Connections: Socially Integrated (07/07/2021)   Social Connection and Isolation Panel  [NHANES]    Frequency of Communication with Friends and Family: More than three times a week    Frequency of Social Gatherings with Friends and Family: Once a week    Attends Religious Services: More than 4 times per year    Active Member of [employer] or Organizations: Yes    Attends Music therapist: More than 4 times per year    Marital Status: Married   Allergies  Allergen Reactions   Codeine Nausea And Vomiting   Lisinopril Cough   Family History  Problem Relation Age of Onset   Cancer Paternal Grandfather    Heart disease Paternal Grandmother    Cancer Maternal Grandfather    Cancer Father    Dementia Mother    Cancer Mother    Hypertension Brother    Cancer Son        testicular & lung   CAD Neg Hx     Current Outpatient Medications (Endocrine & Metabolic):    estradiol (ESTRACE) 1 MG tablet, Take 1 tablet (1 mg total) by mouth daily.  Current Outpatient Medications (Cardiovascular):    amLODipine (NORVASC) 5 MG tablet, Take 5 mg by mouth daily.   hydrochlorothiazide (HYDRODIURIL) 25 MG tablet, Take 25 mg by mouth daily.   metoprolol succinate (TOPROL-XL) 25 MG 24 hr tablet, Take 12.5 mg by mouth daily.  Current Outpatient Medications (Respiratory):    montelukast (SINGULAIR) 10 MG tablet, Take 1 tablet (10 mg total) by mouth at bedtime.   albuterol (PROVENTIL HFA;VENTOLIN HFA) 108 (90 BASE) MCG/ACT inhaler, Inhale 2 puffs into the lungs every 6 (six) hours as needed for wheezing.   fluticasone-salmeterol (ADVAIR) 250-50 MCG/ACT AEPB, Inhale 1 puff into the lungs in the morning and at bedtime.  Current Outpatient Medications (Analgesics):    aspirin 81 MG tablet, Take 81 mg by mouth daily.   meloxicam (MOBIC) 15 MG tablet, TAKE ONE TABLET BY MOUTH ONCE DAILY   traMADol (ULTRAM) 50 MG tablet, Take 1-2 tablets (50-100 mg total) by mouth every 6 (six) hours as needed for moderate pain.  Current Outpatient Medications (Hematological):    Ferrous Gluconate  (IRON 27 PO), Take 27 mg by mouth daily.  Current Outpatient Medications (Other):    Cholecalciferol (VITAMIN D3) 5000 units CAPS, Take 5,000 Units by mouth daily.   cyclobenzaprine (FLEXERIL) 5 MG tablet, TAKE ONE TABLET BY MOUTH THREE TIMES DAILY AS NEEDED FOR MUSCLE SPASM   escitalopram (LEXAPRO) 5 MG tablet, Take 5 mg by mouth daily.   gabapentin (NEURONTIN) 100 MG capsule, TAKE 2 CAPSULES BY MOUTH AT BEDTIME.   OVER THE COUNTER MEDICATION, Take 1 capsule by mouth in the morning and at bedtime. Super Beets   Potassium 99 MG TABS, Take 1 tablet by mouth daily.   Probiotic Product (PROBIOTIC  PO), Take by mouth.   Vitamin A 2400 MCG (8000 UT) CAPS, Take 8,000 Units by mouth daily.   Vitamin D, Ergocalciferol, (DRISDOL) 1.25 MG (50000 UNIT) CAPS capsule, Take 1 capsule (50,000 Units total) by mouth every 7 (seven) days.   vitamin E 180 MG (400 UNITS) capsule, Take 400 Units by mouth daily.   zinc gluconate 50 MG tablet, Take 50 mg by mouth daily.   Reviewed prior external information including notes and imaging from  primary care provider As well as notes that were available from care everywhere and other healthcare systems.  Past medical history, social, surgical and family history all reviewed in electronic medical record.  No pertanent information unless stated regarding to the chief complaint.   Review of Systems:  No headache, visual changes, nausea, vomiting, diarrhea, constipation, dizziness, abdominal pain, skin rash, fevers, chills, night sweats, weight loss, swollen lymph nodes,  joint swelling, chest pain, shortness of breath, mood changes. POSITIVE muscle aches, body aches  Objective  Blood pressure 130/80, pulse (!) 102, height '5\' 4"'$  (1.626 m), weight 214 lb (97.1 kg), SpO2 92 %.   General: No apparent distress alert and oriented x3 mood and affect normal, dressed appropriately.  HEENT: Pupils equal, extraocular movements intact  Respiratory: Patient's speak in full  sentences and does not appear short of breath  Cardiovascular: No lower extremity edema, non tender, no erythema  Low back exam does have some loss of lordosis.  Patient is tender to palpation out of proportion.  Does have significant arthritic type of movements of the lower extremities as well.  Knee replacements noted.    Impression and Recommendations:     The above documentation has been reviewed and is accurate and complete Lyndal Pulley, DO

## 2021-12-02 ENCOUNTER — Ambulatory Visit: Payer: Medicare Other | Admitting: Family Medicine

## 2021-12-02 ENCOUNTER — Encounter: Payer: Self-pay | Admitting: Family Medicine

## 2021-12-02 VITALS — BP 130/80 | HR 102 | Ht 64.0 in | Wt 214.0 lb

## 2021-12-02 DIAGNOSIS — M48061 Spinal stenosis, lumbar region without neurogenic claudication: Secondary | ICD-10-CM

## 2021-12-02 DIAGNOSIS — M5459 Other low back pain: Secondary | ICD-10-CM

## 2021-12-02 MED ORDER — KETOROLAC TROMETHAMINE 30 MG/ML IJ SOLN
30.0000 mg | Freq: Once | INTRAMUSCULAR | Status: AC
  Administered 2021-12-02: 30 mg via INTRAMUSCULAR

## 2021-12-02 MED ORDER — METHYLPREDNISOLONE ACETATE 40 MG/ML IJ SUSP
40.0000 mg | Freq: Once | INTRAMUSCULAR | Status: AC
  Administered 2021-12-02: 40 mg via INTRAMUSCULAR

## 2021-12-02 MED ORDER — MONTELUKAST SODIUM 10 MG PO TABS: 10.0000 mg | ORAL_TABLET | Freq: Every day | ORAL | 3 refills | Status: DC

## 2021-12-02 NOTE — Patient Instructions (Addendum)
Injection today Singulair '10mg'$  prescribed. Please review black box warning Stay active  See you again in 2-3 months

## 2021-12-02 NOTE — Assessment & Plan Note (Signed)
Still believe the patient does have more of a spinal stenosis.  Patient is supposed to be taking gabapentin, no significant side effects.  Patient is currently meloxicam but only in 5-day bursts from now on.  Try a low dose of Singulair to see how patient potentially will respond to this as a adjunct therapy for some of the discomfort and pain she is having.  We did discuss potentially changing the Lexapro to another medication such as Effexor or Cymbalta for pain relief if necessary.  Patient will follow-up again in 2 to 3 months

## 2021-12-14 ENCOUNTER — Ambulatory Visit (HOSPITAL_COMMUNITY)
Admission: RE | Admit: 2021-12-14 | Discharge: 2021-12-14 | Disposition: A | Discharge status: Home / Self Care | Payer: Medicare Other | Source: Ambulatory Visit | Attending: Adult Health Nurse Practitioner | Admitting: Adult Health Nurse Practitioner

## 2021-12-14 DIAGNOSIS — Z1231 Encounter for screening mammogram for malignant neoplasm of breast: Secondary | ICD-10-CM | POA: Diagnosis not present

## 2022-01-02 ENCOUNTER — Ambulatory Visit: Payer: Medicare Other | Admitting: Family Medicine

## 2022-01-17 ENCOUNTER — Telehealth: Payer: Self-pay | Admitting: Adult Health

## 2022-01-17 MED ORDER — ESTRADIOL 1 MG PO TABS: 1.0000 mg | ORAL_TABLET | Freq: Every day | ORAL | 2 refills | Status: DC

## 2022-01-17 NOTE — Telephone Encounter (Signed)
Pt states she needs her estradiol (ESTRACE) 1 MG called into her online pharmacy. This is [email or web address] and the number is [phone]

## 2022-01-17 NOTE — Addendum Note (Signed)
Addended by: Derrek Monaco A on: 01/17/2022 05:06 PM   Modules accepted: Orders

## 2022-01-17 NOTE — Telephone Encounter (Signed)
Refilled estrace 1 mg

## 2022-01-31 NOTE — Progress Notes (Addendum)
[place] Phone: [phone] Subjective:   Fontaine No, am serving as a scribe for Dr. Hulan Saas.   I'm seeing this patient by the request  of:  Pablo Lawrence, NP  CC: Back pain follow-up, knee pain  [id number]  12/02/2021 Still believe the patient does have more of a spinal stenosis.  Patient is supposed to be taking gabapentin, no significant side effects.  Patient is currently meloxicam but only in 5-day bursts from now on.  Try a low dose of Singulair to see how patient potentially will respond to this as a adjunct therapy for some of the discomfort and pain she is having.  We did discuss potentially changing the Lexapro to another medication such as Effexor or Cymbalta for pain relief if necessary.  Patient will follow-up again in 2 to 3 months  Update 02/01/2022 Allsion Nogales is a 71 y.o. female coming in with complaint of lumbar spinal stenosis. Facet injections in May 2023 which have helped but are starting to wear off. Patient states that her L hip pain increases with walking and doing chores. Also c/o pain in L knee that is more of a tightness like a rubber band that is giving her resistance.       Past Medical History:  Diagnosis Date   Allergy    Arthritis    Asthma    Chronic fatigue    COVID-19    Depression    Hyperparathyroidism ([place])    Hypertension    Migraines    cluster   Sleep apnea    Past Surgical History:  Procedure Laterality Date   ABDOMINAL HYSTERECTOMY  1985   CHOLECYSTECTOMY N/A 05/02/2017   Procedure: LAPAROSCOPIC CHOLECYSTECTOMY;  Surgeon: Mickeal Skinner, MD;  Location: [facility];  Service: General;  Laterality: N/A;   LEFT HEART CATH AND CORONARY ANGIOGRAPHY N/A 04/30/2017   Procedure: LEFT HEART CATH AND CORONARY ANGIOGRAPHY;  Surgeon: Lorretta Harp, MD;  Location: [facility];  Service: Cardiovascular;  Laterality: N/A;   LIGAMENT REPAIR  Right    birth defect   PARATHYROIDECTOMY Left 03/10/2021   Procedure: LEFT INFERIOR PARATHYROIDECTOMY;  Surgeon: Armandina Gemma, MD;  Location: [facility];  Service: General;  Laterality: Left;   REPLACEMENT TOTAL KNEE BILATERAL     TOTAL HIP ARTHROPLASTY Right    Social History   Socioeconomic History   Marital status: Married    Spouse name: Not on file   Number of children: Not on file   Years of education: Not on file   Highest education level: Not on file  Occupational History   Occupation: Solicitor  Tobacco Use   Smoking status: Never   Smokeless tobacco: Never  Vaping Use   Vaping Use: Never used  Substance and Sexual Activity   Alcohol use: Yes    Alcohol/week: 0.0 standard drinks of alcohol    Comment: rare   Drug use: Never   Sexual activity: Not Currently    Birth control/protection: Surgical    Comment: hysto  Other Topics Concern   Not on file  Social History Narrative   ** Merged History Encounter **       Social Determinants of Health   Financial Resource Strain: Low Risk  (07/07/2021)   Overall Financial Resource Strain (CARDIA)    Difficulty of Paying Living Expenses: Not very hard  Food Insecurity: No Food Insecurity (07/07/2021)   Hunger Vital Sign  Worried About Charity fundraiser in the Last Year: Never true    [place] in the Last Year: Never true  Transportation Needs: No Transportation Needs (07/07/2021)   PRAPARE - Hydrologist (Medical): No    Lack of Transportation (Non-Medical): No  Physical Activity: Insufficiently Active (07/07/2021)   Exercise Vital Sign    Days of Exercise per Week: 1 day    Minutes of Exercise per Session: 20 min  Stress: No Stress Concern Present (07/07/2021)   [place]    Feeling of Stress : Only a little  Social Connections: Socially Integrated (07/07/2021)   Social Connection and Isolation Panel [NHANES]     Frequency of Communication with Friends and Family: More than three times a week    Frequency of Social Gatherings with Friends and Family: Once a week    Attends Religious Services: More than 4 times per year    Active Member of [employer] or Organizations: Yes    Attends Music therapist: More than 4 times per year    Marital Status: Married   Allergies  Allergen Reactions   Codeine Nausea And Vomiting   Lisinopril Cough   Family History  Problem Relation Age of Onset   Cancer Paternal Grandfather    Heart disease Paternal Grandmother    Cancer Maternal Grandfather    Cancer Father    Dementia Mother    Cancer Mother    Hypertension Brother    Cancer Son        testicular & lung   CAD Neg Hx     Current Outpatient Medications (Endocrine & Metabolic):    estradiol (ESTRACE) 1 MG tablet, Take 1 tablet (1 mg total) by mouth daily.   Semaglutide,0.25 or 0.'5MG'$ /DOS, (OZEMPIC, 0.25 OR 0.5 MG/DOSE,) 2 MG/1.5ML SOPN, Inject into the skin.  Current Outpatient Medications (Cardiovascular):    amLODipine (NORVASC) 5 MG tablet, Take 5 mg by mouth daily.   hydrochlorothiazide (HYDRODIURIL) 25 MG tablet, Take 25 mg by mouth daily.   metoprolol succinate (TOPROL-XL) 25 MG 24 hr tablet, Take 12.5 mg by mouth daily.  Current Outpatient Medications (Respiratory):    albuterol (PROVENTIL HFA;VENTOLIN HFA) 108 (90 BASE) MCG/ACT inhaler, Inhale 2 puffs into the lungs every 6 (six) hours as needed for wheezing.   fluticasone-salmeterol (ADVAIR) 250-50 MCG/ACT AEPB, Inhale 1 puff into the lungs in the morning and at bedtime.   montelukast (SINGULAIR) 10 MG tablet, Take 1 tablet (10 mg total) by mouth at bedtime.  Current Outpatient Medications (Analgesics):    aspirin 81 MG tablet, Take 81 mg by mouth daily.   meloxicam (MOBIC) 15 MG tablet, TAKE ONE TABLET BY MOUTH ONCE DAILY   traMADol (ULTRAM) 50 MG tablet, Take 1-2 tablets (50-100 mg total) by mouth every 6 (six) hours as needed for  moderate pain.  Current Outpatient Medications (Hematological):    Ferrous Gluconate (IRON 27 PO), Take 27 mg by mouth daily.  Current Outpatient Medications (Other):    Cholecalciferol (VITAMIN D3) 5000 units CAPS, Take 5,000 Units by mouth daily.   cyclobenzaprine (FLEXERIL) 5 MG tablet, TAKE ONE TABLET BY MOUTH THREE TIMES DAILY AS NEEDED FOR MUSCLE SPASM   escitalopram (LEXAPRO) 5 MG tablet, Take 5 mg by mouth daily.   gabapentin (NEURONTIN) 100 MG capsule, TAKE 2 CAPSULES BY MOUTH AT BEDTIME.   OVER THE COUNTER MEDICATION, Take 1 capsule by mouth in the morning and  at bedtime. Super Beets   Potassium 99 MG TABS, Take 1 tablet by mouth daily.   Probiotic Product (PROBIOTIC PO), Take by mouth.   Vitamin A 2400 MCG (8000 UT) CAPS, Take 8,000 Units by mouth daily.   Vitamin D, Ergocalciferol, (DRISDOL) 1.25 MG (50000 UNIT) CAPS capsule, Take 1 capsule (50,000 Units total) by mouth every 7 (seven) days.   vitamin E 180 MG (400 UNITS) capsule, Take 400 Units by mouth daily.   zinc gluconate 50 MG tablet, Take 50 mg by mouth daily.   Reviewed prior external information including notes and imaging from  primary care provider As well as notes that were available from care everywhere and other healthcare systems.  Past medical history, social, surgical and family history all reviewed in electronic medical record.  No pertanent information unless stated regarding to the chief complaint.   Review of Systems:  No headache, visual changes, nausea, vomiting, diarrhea, constipation, dizziness, abdominal pain, skin rash, fevers, chills, night sweats, weight loss, swollen lymph nodes, body aches, joint swelling, chest pain, shortness of breath, mood changes. POSITIVE muscle aches  Objective  Blood pressure 130/80, pulse 66, height '5\' 4"'$  (1.626 m), weight 208 lb (94.3 kg), SpO2 99 %.   General: No apparent distress alert and oriented x3 mood and affect normal, dressed appropriately.  HEENT: Pupils  equal, extraocular movements intact  Respiratory: Patient's speak in full sentences and does not appear short of breath  Cardiovascular: No lower extremity edema, non tender, no erythema  Severely antalgic gait noted.  Leg length discrepancy noted left greater than right.  Patient does have instability and feels with audible clicking and mild pain with valgus and varus force of the knee replacement.  Trace fluid possibly in the patellofemoral joint.  Patient has 90 degrees of flexion and lacks last 2 degrees of extension.  Abnormal thigh to calf ratio noted.  Low back exam does have some loss of lordosis.  Some tenderness to palpation noted.  Worsening pain with extension of the back.    Impression and Recommendations:    The above documentation has been reviewed and is accurate and complete Lyndal Pulley, DO

## 2022-02-01 ENCOUNTER — Ambulatory Visit: Payer: Medicare Other | Admitting: Family Medicine

## 2022-02-01 ENCOUNTER — Ambulatory Visit (INDEPENDENT_AMBULATORY_CARE_PROVIDER_SITE_OTHER): Payer: Medicare Other

## 2022-02-01 VITALS — BP 130/80 | HR 66 | Ht 64.0 in | Wt 208.0 lb

## 2022-02-01 DIAGNOSIS — M2170 Unequal limb length (acquired), unspecified site: Secondary | ICD-10-CM | POA: Diagnosis not present

## 2022-02-01 DIAGNOSIS — M5459 Other low back pain: Secondary | ICD-10-CM | POA: Diagnosis not present

## 2022-02-01 DIAGNOSIS — Z96652 Presence of left artificial knee joint: Secondary | ICD-10-CM

## 2022-02-01 DIAGNOSIS — G8929 Other chronic pain: Secondary | ICD-10-CM

## 2022-02-01 DIAGNOSIS — M25562 Pain in left knee: Secondary | ICD-10-CM

## 2022-02-01 DIAGNOSIS — M48061 Spinal stenosis, lumbar region without neurogenic claudication: Secondary | ICD-10-CM | POA: Diagnosis not present

## 2022-02-01 DIAGNOSIS — T84023A Instability of internal left knee prosthesis, initial encounter: Secondary | ICD-10-CM

## 2022-02-01 DIAGNOSIS — T8484XA Pain due to internal orthopedic prosthetic devices, implants and grafts, initial encounter: Secondary | ICD-10-CM | POA: Insufficient documentation

## 2022-02-01 NOTE — Patient Instructions (Signed)
L knee xray Bone scan LLE Custom stability brace Facet injections [phone] Can hold on injections if you want See me in 2-3 months

## 2022-02-01 NOTE — Assessment & Plan Note (Signed)
Patient did respond well to the facet injections.  Chronic problem with worsening symptoms seems to be worsening at this point and we will already ordered the facet injections.  Patient does have some travel plans coming up.  Patient does have the meloxicam, gabapentin as well that could be helpful.  Encouraged her to continue to lose weight.  Follow-up with me again in 6 weeks after.

## 2022-02-01 NOTE — Assessment & Plan Note (Signed)
Continue to wear the lift.  Patient noted does have instability and feels like of the left knee that we will consider bone scan to make sure there is no significant loosening.

## 2022-02-01 NOTE — Assessment & Plan Note (Addendum)
Patient is in pain and seems to have some instability with valgus and varus force.  Abnormal thigh to calf ratio.  Patient is ambulatory and I do think it over a stability brace can beneficial.  This would help patient continue to be active.  We are going to get a bone scan to make sure that there is no significant loosening of this total knee replacement.  If there is we will discuss further treatment options thereafter.  Patient also has an abnormal thigh to calf ratio noted in that knee does make a custom bracing needed for this instability and pain.

## 2022-02-08 ENCOUNTER — Ambulatory Visit
Admission: RE | Admit: 2022-02-08 | Discharge: 2022-02-08 | Disposition: A | Discharge status: Home / Self Care | Payer: Medicare Other | Source: Ambulatory Visit | Attending: Family Medicine | Admitting: Family Medicine

## 2022-02-08 DIAGNOSIS — M5459 Other low back pain: Secondary | ICD-10-CM

## 2022-02-08 MED ORDER — METHYLPREDNISOLONE ACETATE 40 MG/ML INJ SUSP (RADIOLOG
80.0000 mg | Freq: Once | INTRAMUSCULAR | Status: AC
  Administered 2022-02-08: 80 mg via INTRA_ARTICULAR

## 2022-02-08 MED ORDER — IOPAMIDOL (ISOVUE-M 200) INJECTION 41%
1.0000 mL | Freq: Once | INTRAMUSCULAR | Status: AC
  Administered 2022-02-08: 1 mL via INTRA_ARTICULAR

## 2022-02-08 NOTE — Discharge Instructions (Signed)

## 2022-02-14 DIAGNOSIS — T84023A Instability of internal left knee prosthesis, initial encounter: Secondary | ICD-10-CM | POA: Insufficient documentation

## 2022-02-16 ENCOUNTER — Encounter (HOSPITAL_COMMUNITY)
Admission: RE | Admit: 2022-02-16 | Discharge: 2022-02-16 | Disposition: A | Discharge status: Home / Self Care | Payer: Medicare Other | Source: Ambulatory Visit | Attending: Family Medicine | Admitting: Family Medicine

## 2022-02-16 ENCOUNTER — Encounter (HOSPITAL_COMMUNITY): Payer: Self-pay

## 2022-02-16 DIAGNOSIS — G8929 Other chronic pain: Secondary | ICD-10-CM | POA: Insufficient documentation

## 2022-02-16 DIAGNOSIS — M25562 Pain in left knee: Secondary | ICD-10-CM | POA: Insufficient documentation

## 2022-02-16 MED ORDER — TECHNETIUM TC 99M MEDRONATE IV KIT
20.0000 | PACK | Freq: Once | INTRAVENOUS | Status: AC | PRN
  Administered 2022-02-16: 21 via INTRAVENOUS

## 2022-02-25 ENCOUNTER — Other Ambulatory Visit: Payer: Self-pay | Admitting: Family Medicine

## 2022-04-04 LAB — COLOGUARD: COLOGUARD: NEGATIVE

## 2022-04-10 NOTE — Progress Notes (Unsigned)
[facility] [place] Phone: [phone] Subjective:   Traci Johnson, am serving as a scribe for Dr. Hulan Saas.  I'm seeing this patient by the request  of:  Pablo Lawrence, NP  CC: Low back pain follow-up  [id number]  02/01/2022 Patient is in pain and seems to have some instability with valgus and varus force.  Abnormal thigh to calf ratio.  Patient is ambulatory and I do think it over a stability brace can beneficial.  This would help patient continue to be active.  We are going to get a bone scan to make sure that there is no significant loosening of this total knee replacement.  If there is we will discuss further treatment options thereafter.  Patient also has an abnormal thigh to calf ratio noted in that knee does make a custom bracing needed for this instability and pain.     Continue to wear the lift.  Patient noted does have instability and feels like of the left knee that we will consider bone scan to make sure there is no significant loosening.     Patient did respond well to the facet injections.  Chronic problem with worsening symptoms seems to be worsening at this point and we will already ordered the facet injections.  Patient does have some travel plans coming up.  Patient does have the meloxicam, gabapentin as well that could be helpful.  Encouraged her to continue to lose weight.  Follow-up with me again in 6 weeks after.      Update 04/12/2022 Traci Johnson is a 71 y.o. female coming in with complaint of lumbar spine pain and L knee pain. Patient states that he lower back and left hip is throbbing. Patient drove to the beach and back and is now having pretty bad pain. The last facet injection did not last as long. Patient went to [place] and did a lot of walking but the brace has been awesome. Still has pain if not in the brace.        Past Medical History:  Diagnosis Date   Allergy    Arthritis     Asthma    Chronic fatigue    COVID-19    Depression    Hyperparathyroidism ([place])    Hypertension    Migraines    cluster   Sleep apnea    Past Surgical History:  Procedure Laterality Date   ABDOMINAL HYSTERECTOMY  1985   CHOLECYSTECTOMY N/A 05/02/2017   Procedure: LAPAROSCOPIC CHOLECYSTECTOMY;  Surgeon: Mickeal Skinner, MD;  Location: [facility];  Service: General;  Laterality: N/A;   LEFT HEART CATH AND CORONARY ANGIOGRAPHY N/A 04/30/2017   Procedure: LEFT HEART CATH AND CORONARY ANGIOGRAPHY;  Surgeon: Lorretta Harp, MD;  Location: [facility] [facility];  Service: Cardiovascular;  Laterality: N/A;   LIGAMENT REPAIR Right    birth defect   PARATHYROIDECTOMY Left 03/10/2021   Procedure: LEFT INFERIOR PARATHYROIDECTOMY;  Surgeon: Armandina Gemma, MD;  Location: [facility];  Service: General;  Laterality: Left;   REPLACEMENT TOTAL KNEE BILATERAL     TOTAL HIP ARTHROPLASTY Right    Social History   Socioeconomic History   Marital status: Married    Spouse name: Not on file   Number of children: Not on file   Years of education: Not on file   Highest education level: Not on file  Occupational History   Occupation: Solicitor  Tobacco Use   Smoking status: Never  Smokeless tobacco: Never  Vaping Use   Vaping Use: Never used  Substance and Sexual Activity   Alcohol use: Yes    Alcohol/week: 0.0 standard drinks of alcohol    Comment: rare   Drug use: Never   Sexual activity: Not Currently    Birth control/protection: Surgical    Comment: hysto  Other Topics Concern   Not on file  Social History Narrative   ** Merged History Encounter **       Social Determinants of Health   Financial Resource Strain: Low Risk  (07/07/2021)   Overall Financial Resource Strain (CARDIA)    Difficulty of Paying Living Expenses: Not very hard  Food Insecurity: No Food Insecurity (07/07/2021)   Hunger Vital Sign    Worried About Running Out of Food in the Last Year: Never true    Ran  Out of Food in the Last Year: Never true  Transportation Needs: No Transportation Needs (07/07/2021)   PRAPARE - Hydrologist (Medical): No    Lack of Transportation (Non-Medical): No  Physical Activity: Insufficiently Active (07/07/2021)   Exercise Vital Sign    Days of Exercise per Week: 1 day    Minutes of Exercise per Session: 20 min  Stress: No Stress Concern Present (07/07/2021)   [place]    Feeling of Stress : Only a little  Social Connections: Socially Integrated (07/07/2021)   Social Connection and Isolation Panel [NHANES]    Frequency of Communication with Friends and Family: More than three times a week    Frequency of Social Gatherings with Friends and Family: Once a week    Attends Religious Services: More than 4 times per year    Active Member of [employer] or Organizations: Yes    Attends Music therapist: More than 4 times per year    Marital Status: Married   Allergies  Allergen Reactions   Codeine Nausea And Vomiting   Lisinopril Cough   Family History  Problem Relation Age of Onset   Cancer Paternal Grandfather    Heart disease Paternal Grandmother    Cancer Maternal Grandfather    Cancer Father    Dementia Mother    Cancer Mother    Hypertension Brother    Cancer Son        testicular & lung   CAD Neg Hx     Current Outpatient Medications (Endocrine & Metabolic):    estradiol (ESTRACE) 1 MG tablet, Take 1 tablet (1 mg total) by mouth daily.   predniSONE (DELTASONE) 20 MG tablet, Take 2 tablets (40 mg total) by mouth daily with breakfast.  Current Outpatient Medications (Cardiovascular):    amLODipine (NORVASC) 5 MG tablet, Take 5 mg by mouth daily.   hydrochlorothiazide (HYDRODIURIL) 25 MG tablet, Take 25 mg by mouth daily.   metoprolol succinate (TOPROL-XL) 25 MG 24 hr tablet, Take 12.5 mg by mouth daily.  Current Outpatient Medications (Respiratory):     albuterol (PROVENTIL HFA;VENTOLIN HFA) 108 (90 BASE) MCG/ACT inhaler, Inhale 2 puffs into the lungs every 6 (six) hours as needed for wheezing.   fluticasone-salmeterol (ADVAIR) 250-50 MCG/ACT AEPB, Inhale 1 puff into the lungs in the morning and at bedtime.   montelukast (SINGULAIR) 10 MG tablet, TAKE 1 TABLET BY MOUTH EVERYDAY AT BEDTIME  Current Outpatient Medications (Analgesics):    aspirin 81 MG tablet, Take 81 mg by mouth daily.   meloxicam (MOBIC) 15 MG tablet, TAKE ONE TABLET  BY MOUTH ONCE DAILY   traMADol (ULTRAM) 50 MG tablet, Take 1-2 tablets (50-100 mg total) by mouth every 6 (six) hours as needed for moderate pain.   Current Outpatient Medications (Other):    Cholecalciferol (VITAMIN D3) 5000 units CAPS, Take 5,000 Units by mouth daily.   cyclobenzaprine (FLEXERIL) 5 MG tablet, TAKE ONE TABLET BY MOUTH THREE TIMES DAILY AS NEEDED FOR MUSCLE SPASM   escitalopram (LEXAPRO) 5 MG tablet, Take 5 mg by mouth daily.   gabapentin (NEURONTIN) 100 MG capsule, TAKE 2 CAPSULES BY MOUTH AT BEDTIME.   OVER THE COUNTER MEDICATION, Take 1 capsule by mouth in the morning and at bedtime. Super Beets   Potassium 99 MG TABS, Take 1 tablet by mouth daily.   Probiotic Product (PROBIOTIC PO), Take by mouth.   Vitamin A 2400 MCG (8000 UT) CAPS, Take 8,000 Units by mouth daily.   vitamin E 180 MG (400 UNITS) capsule, Take 400 Units by mouth daily.   zinc gluconate 50 MG tablet, Take 50 mg by mouth daily.   Reviewed prior external information including notes and imaging from  primary care provider As well as notes that were available from care everywhere and other healthcare systems.  Past medical history, social, surgical and family history all reviewed in electronic medical record.  No pertanent information unless stated regarding to the chief complaint.   Review of Systems:  No headache, visual changes, nausea, vomiting, diarrhea, constipation, dizziness, abdominal pain, skin rash, fevers,  chills, night sweats, weight loss, swollen lymph nodes, body aches, joint swelling, chest pain, shortness of breath, mood changes. POSITIVE muscle aches  Objective  Blood pressure 124/80, pulse 82, height '5\' 4"'$  (1.626 m), SpO2 95 %.   General: No apparent distress alert and oriented x3 mood and affect normal, dressed appropriately.  HEENT: Pupils equal, extraocular movements intact  Respiratory: Patient's speak in full sentences and does not appear short of breath  Cardiovascular: No lower extremity edema, non tender, no erythema  Severely antalgic gait with still instability of the knee noted.  Patient is wearing an OA stability brace though.  Patient's back exam significant tightness of the paraspinal musculature on the left side of the lumbar spine.  Multiple trigger points noted in the spinalis muscle.  After verbal consent patient was prepped with alcohol swab and with a 25-gauge 1-1/2 inch needle injected into 4 distinct trigger points in mostly the lumbar area.  Total of 3 cc of 0.5% Marcaine and 1 cc of Kenalog 40 mg/mL used.  No blood loss.  Band-Aid placed.  Postinjection instructions given    Impression and Recommendations:    The above documentation has been reviewed and is accurate and complete Lyndal Pulley, DO

## 2022-04-12 ENCOUNTER — Ambulatory Visit: Payer: Medicare Other | Admitting: Family Medicine

## 2022-04-12 VITALS — BP 124/80 | HR 82 | Ht 64.0 in

## 2022-04-12 DIAGNOSIS — M5459 Other low back pain: Secondary | ICD-10-CM

## 2022-04-12 DIAGNOSIS — T84023D Instability of internal left knee prosthesis, subsequent encounter: Secondary | ICD-10-CM | POA: Diagnosis not present

## 2022-04-12 MED ORDER — PREDNISONE 20 MG PO TABS: 40.0000 mg | ORAL_TABLET | Freq: Every day | ORAL | 0 refills | Status: DC

## 2022-04-12 NOTE — Assessment & Plan Note (Signed)
Patient does have a significant leg length discrepancy secondary to her replacement and still feels like she has significant instability of the knee.  And would like to talk to another orthopedic surgeon to discuss further.  Discussed with patient that this would be relatively significant.  Did have the bone scan that did not show any specific loosening but patient on exam does seem uncomfortable and not competent with ambulating without her brace.  Referral placed today

## 2022-04-12 NOTE — Patient Instructions (Signed)
Good to see you  Tried some trigger point injections today  Ice 20 minutes 2 times daily. Usually after activity and before bed. If not better by Saturday take the prednisone  If not better by Tuesday write [place] and lets get another facet injection  Will get you in with ortho to discuss revision  See me again in 6 weeks

## 2022-04-12 NOTE — Assessment & Plan Note (Signed)
Patient given trigger point injections and tolerated the procedure well, patient did have some mild improvement almost immediately.  Discussed with patient about icing regimen and home exercises, which activities to do and which ones to avoid.  Increase activity slowly over the course the next several weeks.  Follow-up with me again in 6 to 8 weeks.  We discussed if any worsening pain I would like to repeat her to consider the facet injections again.  Total time with patient and significant other that does not include the injections over the procedure today 32 minutes

## 2022-04-24 ENCOUNTER — Telehealth: Payer: Self-pay | Admitting: Family Medicine

## 2022-04-24 ENCOUNTER — Other Ambulatory Visit: Payer: Self-pay

## 2022-04-24 DIAGNOSIS — T84023D Instability of internal left knee prosthesis, subsequent encounter: Secondary | ICD-10-CM

## 2022-04-24 NOTE — Telephone Encounter (Signed)
Referral placed and patient notified.  

## 2022-04-24 NOTE — Telephone Encounter (Signed)
At last visit on 11/8, Dr. Tamala Julian advised we would be referring to Ortho. I do not see referral.

## 2022-05-05 ENCOUNTER — Telehealth: Payer: Self-pay | Admitting: Family Medicine

## 2022-05-05 ENCOUNTER — Other Ambulatory Visit: Payer: Self-pay

## 2022-05-05 DIAGNOSIS — M47816 Spondylosis without myelopathy or radiculopathy, lumbar region: Secondary | ICD-10-CM

## 2022-05-05 NOTE — Telephone Encounter (Signed)
Order placed and patient notified 

## 2022-05-05 NOTE — Telephone Encounter (Signed)
Patient called asking if Dr Traci Johnson could order a repeat epidural injection?  Please advise.

## 2022-05-17 ENCOUNTER — Other Ambulatory Visit: Payer: Medicare Other

## 2022-05-17 ENCOUNTER — Ambulatory Visit
Admission: RE | Admit: 2022-05-17 | Discharge: 2022-05-17 | Disposition: A | Discharge status: Home / Self Care | Payer: Medicare Other | Source: Ambulatory Visit | Attending: Family Medicine | Admitting: Family Medicine

## 2022-05-17 DIAGNOSIS — M47816 Spondylosis without myelopathy or radiculopathy, lumbar region: Secondary | ICD-10-CM

## 2022-05-17 MED ORDER — METHYLPREDNISOLONE ACETATE 40 MG/ML INJ SUSP (RADIOLOG
80.0000 mg | Freq: Once | INTRAMUSCULAR | Status: AC
  Administered 2022-05-17: 80 mg via INTRA_ARTICULAR

## 2022-05-17 MED ORDER — IOPAMIDOL (ISOVUE-M 200) INJECTION 41%
1.0000 mL | Freq: Once | INTRAMUSCULAR | Status: AC
  Administered 2022-05-17: 1 mL via INTRA_ARTICULAR

## 2022-05-17 NOTE — Discharge Instructions (Signed)

## 2022-05-18 ENCOUNTER — Other Ambulatory Visit: Payer: Medicare Other

## 2022-05-22 NOTE — Progress Notes (Deleted)
[facility] [place] Phone: [phone] Subjective:    I'm seeing this patient by the request  of:  Pablo Lawrence, NP  CC:   [id number]  04/12/2022 Patient does have a significant leg length discrepancy secondary to her replacement and still feels like she has significant instability of the knee. And would like to talk to another orthopedic surgeon to discuss further. Discussed with patient that this would be relatively significant. Did have the bone scan that did not show any specific loosening but patient on exam does seem uncomfortable and not competent with ambulating without her brace. Referral placed today   Patient given trigger point injections and tolerated the procedure well, patient did have some mild improvement almost immediately.  Discussed with patient about icing regimen and home exercises, which activities to do and which ones to avoid.  Increase activity slowly over the course the next several weeks.  Follow-up with me again in 6 to 8 weeks.  We discussed if any worsening pain I would like to repeat her to consider the facet injections again.  Total time with patient and significant other that does not include the injections over the procedure today 32 minutes      Update 05/24/2022 Traci Johnson is a 71 y.o. female coming in with complaint of L knee and LBP.  Facet injection 05/17/2022. Patient states        Past Medical History:  Diagnosis Date   Allergy    Arthritis    Asthma    Chronic fatigue    COVID-19    Depression    Hyperparathyroidism ([place])    Hypertension    Migraines    cluster   Sleep apnea    Past Surgical History:  Procedure Laterality Date   ABDOMINAL HYSTERECTOMY  1985   CHOLECYSTECTOMY N/A 05/02/2017   Procedure: LAPAROSCOPIC CHOLECYSTECTOMY;  Surgeon: Mickeal Skinner, MD;  Location: [facility];  Service: General;  Laterality: N/A;   LEFT HEART CATH AND CORONARY  ANGIOGRAPHY N/A 04/30/2017   Procedure: LEFT HEART CATH AND CORONARY ANGIOGRAPHY;  Surgeon: Lorretta Harp, MD;  Location: [facility];  Service: Cardiovascular;  Laterality: N/A;   LIGAMENT REPAIR Right    birth defect   PARATHYROIDECTOMY Left 03/10/2021   Procedure: LEFT INFERIOR PARATHYROIDECTOMY;  Surgeon: Armandina Gemma, MD;  Location: [facility];  Service: General;  Laterality: Left;   REPLACEMENT TOTAL KNEE BILATERAL     TOTAL HIP ARTHROPLASTY Right    Social History   Socioeconomic History   Marital status: Married    Spouse name: Not on file   Number of children: Not on file   Years of education: Not on file   Highest education level: Not on file  Occupational History   Occupation: Solicitor  Tobacco Use   Smoking status: Never   Smokeless tobacco: Never  Vaping Use   Vaping Use: Never used  Substance and Sexual Activity   Alcohol use: Yes    Alcohol/week: 0.0 standard drinks of alcohol    Comment: rare   Drug use: Never   Sexual activity: Not Currently    Birth control/protection: Surgical    Comment: hysto  Other Topics Concern   Not on file  Social History Narrative   ** Merged History Encounter **       Social Determinants of Health   Financial Resource Strain: Low Risk  (07/07/2021)   Overall Financial Resource Strain (CARDIA)  Difficulty of Paying Living Expenses: Not very hard  Food Insecurity: No Food Insecurity (07/07/2021)   Hunger Vital Sign    Worried About Running Out of Food in the Last Year: Never true    Ran Out of Food in the Last Year: Never true  Transportation Needs: No Transportation Needs (07/07/2021)   PRAPARE - Hydrologist (Medical): No    Lack of Transportation (Non-Medical): No  Physical Activity: Insufficiently Active (07/07/2021)   Exercise Vital Sign    Days of Exercise per Week: 1 day    Minutes of Exercise per Session: 20 min  Stress: No Stress Concern Present (07/07/2021)   [place]    Feeling of Stress : Only a little  Social Connections: Socially Integrated (07/07/2021)   Social Connection and Isolation Panel [NHANES]    Frequency of Communication with Friends and Family: More than three times a week    Frequency of Social Gatherings with Friends and Family: Once a week    Attends Religious Services: More than 4 times per year    Active Member of [employer] or Organizations: Yes    Attends Music therapist: More than 4 times per year    Marital Status: Married   Allergies  Allergen Reactions   Codeine Nausea And Vomiting   Lisinopril Cough   Family History  Problem Relation Age of Onset   Cancer Paternal Grandfather    Heart disease Paternal Grandmother    Cancer Maternal Grandfather    Cancer Father    Dementia Mother    Cancer Mother    Hypertension Brother    Cancer Son        testicular & lung   CAD Neg Hx     Current Outpatient Medications (Endocrine & Metabolic):    estradiol (ESTRACE) 1 MG tablet, Take 1 tablet (1 mg total) by mouth daily.   predniSONE (DELTASONE) 20 MG tablet, Take 2 tablets (40 mg total) by mouth daily with breakfast.  Current Outpatient Medications (Cardiovascular):    amLODipine (NORVASC) 5 MG tablet, Take 5 mg by mouth daily.   hydrochlorothiazide (HYDRODIURIL) 25 MG tablet, Take 25 mg by mouth daily.   metoprolol succinate (TOPROL-XL) 25 MG 24 hr tablet, Take 12.5 mg by mouth daily.  Current Outpatient Medications (Respiratory):    albuterol (PROVENTIL HFA;VENTOLIN HFA) 108 (90 BASE) MCG/ACT inhaler, Inhale 2 puffs into the lungs every 6 (six) hours as needed for wheezing.   fluticasone-salmeterol (ADVAIR) 250-50 MCG/ACT AEPB, Inhale 1 puff into the lungs in the morning and at bedtime.   montelukast (SINGULAIR) 10 MG tablet, TAKE 1 TABLET BY MOUTH EVERYDAY AT BEDTIME  Current Outpatient Medications (Analgesics):    aspirin 81 MG tablet, Take 81  mg by mouth daily.   meloxicam (MOBIC) 15 MG tablet, TAKE ONE TABLET BY MOUTH ONCE DAILY   traMADol (ULTRAM) 50 MG tablet, Take 1-2 tablets (50-100 mg total) by mouth every 6 (six) hours as needed for moderate pain.   Current Outpatient Medications (Other):    Cholecalciferol (VITAMIN D3) 5000 units CAPS, Take 5,000 Units by mouth daily.   cyclobenzaprine (FLEXERIL) 5 MG tablet, TAKE ONE TABLET BY MOUTH THREE TIMES DAILY AS NEEDED FOR MUSCLE SPASM   escitalopram (LEXAPRO) 5 MG tablet, Take 5 mg by mouth daily.   gabapentin (NEURONTIN) 100 MG capsule, TAKE 2 CAPSULES BY MOUTH AT BEDTIME.   OVER THE COUNTER MEDICATION, Take 1 capsule by mouth  in the morning and at bedtime. Super Beets   Potassium 99 MG TABS, Take 1 tablet by mouth daily.   Probiotic Product (PROBIOTIC PO), Take by mouth.   Vitamin A 2400 MCG (8000 UT) CAPS, Take 8,000 Units by mouth daily.   vitamin E 180 MG (400 UNITS) capsule, Take 400 Units by mouth daily.   zinc gluconate 50 MG tablet, Take 50 mg by mouth daily.   Reviewed prior external information including notes and imaging from  primary care provider As well as notes that were available from care everywhere and other healthcare systems.  Past medical history, social, surgical and family history all reviewed in electronic medical record.  No pertanent information unless stated regarding to the chief complaint.   Review of Systems:  No headache, visual changes, nausea, vomiting, diarrhea, constipation, dizziness, abdominal pain, skin rash, fevers, chills, night sweats, weight loss, swollen lymph nodes, body aches, joint swelling, chest pain, shortness of breath, mood changes. POSITIVE muscle aches  Objective  There were no vitals taken for this visit.   General: No apparent distress alert and oriented x3 mood and affect normal, dressed appropriately.  HEENT: Pupils equal, extraocular movements intact  Respiratory: Patient's speak in full sentences and does not  appear short of breath  Cardiovascular: No lower extremity edema, non tender, no erythema      Impression and Recommendations:

## 2022-05-24 ENCOUNTER — Ambulatory Visit: Payer: Medicare Other | Admitting: Family Medicine

## 2022-06-27 NOTE — Progress Notes (Unsigned)
Traci Johnson [place] [address] [place] Phone: [phone] Subjective:   [name], am serving as a scribe for Dr. Hulan Saas.  I'm seeing this patient by the request  of:  Pablo Lawrence, NP  CC: Knee pain and back pain  [id number]  04/12/2022 Patient does have a significant leg length discrepancy secondary to her replacement and still feels like she has significant instability of the knee.  And would like to talk to another orthopedic surgeon to discuss further.  Discussed with patient that this would be relatively significant.  Did have the bone scan that did not show any specific loosening but patient on exam does seem uncomfortable and not competent with ambulating without her brace.  Referral placed today     Patient given trigger point injections and tolerated the procedure well, patient did have some mild improvement almost immediately. Discussed with patient about icing regimen and home exercises, which activities to do and which ones to avoid. Increase activity slowly over the course the next several weeks. Follow-up with me again in 6 to 8 weeks. We discussed if any worsening pain I would like to repeat her to consider the facet injections again. Total time with patient and significant other that does not include the injections over the procedure today 32 minutes   Update 06/28/2022 Traci Johnson is a 72 y.o. female coming in with complaint of lumbar spine and L knee pain. Patient states knee and back are about the same. Last back injection didn't help. Needs a new referral for knee. States Dr. Elmyra Ricks won't see her.      Past Medical History:  Diagnosis Date   Allergy    Arthritis    Asthma    Chronic fatigue    COVID-19    Depression    Hyperparathyroidism ([place])    Hypertension    Migraines    cluster   Sleep apnea    Past Surgical History:  Procedure Laterality Date   ABDOMINAL HYSTERECTOMY  1985    CHOLECYSTECTOMY N/A 05/02/2017   Procedure: LAPAROSCOPIC CHOLECYSTECTOMY;  Surgeon: Mickeal Skinner, MD;  Location: [facility];  Service: General;  Laterality: N/A;   LEFT HEART CATH AND CORONARY ANGIOGRAPHY N/A 04/30/2017   Procedure: LEFT HEART CATH AND CORONARY ANGIOGRAPHY;  Surgeon: Lorretta Harp, MD;  Location: [facility];  Service: Cardiovascular;  Laterality: N/A;   LIGAMENT REPAIR Right    birth defect   PARATHYROIDECTOMY Left 03/10/2021   Procedure: LEFT INFERIOR PARATHYROIDECTOMY;  Surgeon: Armandina Gemma, MD;  Location: [facility];  Service: General;  Laterality: Left;   REPLACEMENT TOTAL KNEE BILATERAL     TOTAL HIP ARTHROPLASTY Right    Social History   Socioeconomic History   Marital status: Married    Spouse name: Not on file   Number of children: Not on file   Years of education: Not on file   Highest education level: Not on file  Occupational History   Occupation: Solicitor  Tobacco Use   Smoking status: Never   Smokeless tobacco: Never  Vaping Use   Vaping Use: Never used  Substance and Sexual Activity   Alcohol use: Yes    Alcohol/week: 0.0 standard drinks of alcohol    Comment: rare   Drug use: Never   Sexual activity: Not Currently    Birth control/protection: Surgical    Comment: hysto  Other Topics Concern   Not on file  Social History Narrative   **  Merged History Encounter **       Social Determinants of Health   Financial Resource Strain: Low Risk  (07/07/2021)   Overall Financial Resource Strain (CARDIA)    Difficulty of Paying Living Expenses: Not very hard  Food Insecurity: No Food Insecurity (07/07/2021)   Hunger Vital Sign    Worried About Running Out of Food in the Last Year: Never true    Ran Out of Food in the Last Year: Never true  Transportation Needs: No Transportation Needs (07/07/2021)   PRAPARE - Hydrologist (Medical): No    Lack of Transportation (Non-Medical): No  Physical Activity:  Insufficiently Active (07/07/2021)   Exercise Vital Sign    Days of Exercise per Week: 1 day    Minutes of Exercise per Session: 20 min  Stress: No Stress Concern Present (07/07/2021)   [place]    Feeling of Stress : Only a little  Social Connections: Socially Integrated (07/07/2021)   Social Connection and Isolation Panel [NHANES]    Frequency of Communication with Friends and Family: More than three times a week    Frequency of Social Gatherings with Friends and Family: Once a week    Attends Religious Services: More than 4 times per year    Active Member of [employer] or Organizations: Yes    Attends Music therapist: More than 4 times per year    Marital Status: Married   Allergies  Allergen Reactions   Codeine Nausea And Vomiting   Lisinopril Cough   Family History  Problem Relation Age of Onset   Cancer Paternal Grandfather    Heart disease Paternal Grandmother    Cancer Maternal Grandfather    Cancer Father    Dementia Mother    Cancer Mother    Hypertension Brother    Cancer Son        testicular & lung   CAD Neg Hx     Current Outpatient Medications (Endocrine & Metabolic):    estradiol (ESTRACE) 1 MG tablet, Take 1 tablet (1 mg total) by mouth daily.   predniSONE (DELTASONE) 20 MG tablet, Take 2 tablets (40 mg total) by mouth daily with breakfast.  Current Outpatient Medications (Cardiovascular):    amLODipine (NORVASC) 5 MG tablet, Take 5 mg by mouth daily.   hydrochlorothiazide (HYDRODIURIL) 25 MG tablet, Take 25 mg by mouth daily.   metoprolol succinate (TOPROL-XL) 25 MG 24 hr tablet, Take 12.5 mg by mouth daily.  Current Outpatient Medications (Respiratory):    albuterol (PROVENTIL HFA;VENTOLIN HFA) 108 (90 BASE) MCG/ACT inhaler, Inhale 2 puffs into the lungs every 6 (six) hours as needed for wheezing.   fluticasone-salmeterol (ADVAIR) 250-50 MCG/ACT AEPB, Inhale 1 puff into the lungs in  the morning and at bedtime.   montelukast (SINGULAIR) 10 MG tablet, TAKE 1 TABLET BY MOUTH EVERYDAY AT BEDTIME  Current Outpatient Medications (Analgesics):    aspirin 81 MG tablet, Take 81 mg by mouth daily.   meloxicam (MOBIC) 15 MG tablet, TAKE ONE TABLET BY MOUTH ONCE DAILY   traMADol (ULTRAM) 50 MG tablet, Take 1-2 tablets (50-100 mg total) by mouth every 6 (six) hours as needed for moderate pain.   Current Outpatient Medications (Other):    Cholecalciferol (VITAMIN D3) 5000 units CAPS, Take 5,000 Units by mouth daily.   cyclobenzaprine (FLEXERIL) 5 MG tablet, TAKE ONE TABLET BY MOUTH THREE TIMES DAILY AS NEEDED FOR MUSCLE SPASM   escitalopram (LEXAPRO) 5  MG tablet, Take 5 mg by mouth daily.   gabapentin (NEURONTIN) 100 MG capsule, Take 2 capsules (200 mg total) by mouth at bedtime.   OVER THE COUNTER MEDICATION, Take 1 capsule by mouth in the morning and at bedtime. Super Beets   Potassium 99 MG TABS, Take 1 tablet by mouth daily.   Probiotic Product (PROBIOTIC PO), Take by mouth.   Vitamin A 2400 MCG (8000 UT) CAPS, Take 8,000 Units by mouth daily.   vitamin E 180 MG (400 UNITS) capsule, Take 400 Units by mouth daily.   zinc gluconate 50 MG tablet, Take 50 mg by mouth daily.   Reviewed prior external information including notes and imaging from  primary care provider As well as notes that were available from care everywhere and other healthcare systems.  Past medical history, social, surgical and family history all reviewed in electronic medical record.  No pertanent information unless stated regarding to the chief complaint.   Review of Systems:  No headache, visual changes, nausea, vomiting, diarrhea, constipation, dizziness, abdominal pain, skin rash, fevers, chills, night sweats, weight loss, swollen lymph nodes, body aches, joint swelling, chest pain, shortness of breath, mood changes. POSITIVE muscle aches  Objective  Blood pressure 118/74, pulse 83, height '5\' 4"'$  (1.626  m), weight 223 lb (101.2 kg), SpO2 97 %.   General: No apparent distress alert and oriented x3 mood and affect normal, dressed appropriately.  HEENT: Pupils equal, extraocular movements intact  Respiratory: Patient's speak in full sentences and does not appear short of breath  Cardiovascular: No lower extremity edema, non tender, no erythema  Severely antalgic noted.  Discussed icing regimen and home exercises. Patient is wearing a knee brace at this moment.  No instability brace.  Deferred the rest of the exam. Low back does have some loss lordosis.  Still multiple trigger points in the paraspinal musculature of the lumbar spine.  Does have positive straight leg test on the left side with radicular symptoms.   Impression and Recommendations:     The above documentation has been reviewed and is accurate and complete Lyndal Pulley, DO

## 2022-06-28 ENCOUNTER — Ambulatory Visit: Payer: Medicare Other | Admitting: Family Medicine

## 2022-06-28 VITALS — BP 118/74 | HR 83 | Ht 64.0 in | Wt 223.0 lb

## 2022-06-28 DIAGNOSIS — M48061 Spinal stenosis, lumbar region without neurogenic claudication: Secondary | ICD-10-CM

## 2022-06-28 DIAGNOSIS — T84023D Instability of internal left knee prosthesis, subsequent encounter: Secondary | ICD-10-CM

## 2022-06-28 MED ORDER — GABAPENTIN 100 MG PO CAPS: 200.0000 mg | ORAL_CAPSULE | Freq: Every day | ORAL | 0 refills | Status: DC

## 2022-06-28 NOTE — Assessment & Plan Note (Addendum)
Patient continues to have significant instability of the left knee.  Patient at this moment feels like she cannot do anything without the brace.  Wants to see if there is anything else that can be done.  Will refer her to orthopedic surgery to discuss if any other surgical intervention could be potentially helpful. Bone scan of the left knee did not show any significant loosening.

## 2022-06-28 NOTE — Patient Instructions (Addendum)
Dr. Ninfa Linden knee referral [place] [phone] Call Today to schedule Medial branch block  Gabapentin refilled

## 2022-06-28 NOTE — Assessment & Plan Note (Addendum)
Patient does have lumbar stenosis but seems to have more facet arthropathy causing more of the back pain at this moment.  Facet joints were helpful initially but now has not made as much improvement.  We will see if patient can respond to a medial branch block and then see if she would be a possible candidate for radiofrequency ablation.  Follow-up again 6 weeks after injection to see how patient responds.  Refilled patient's gabapentin again as well as discussed patient cyclobenzaprine for breakthrough pain.

## 2022-07-03 ENCOUNTER — Ambulatory Visit: Payer: Medicare Other | Admitting: Orthopaedic Surgery

## 2022-07-05 ENCOUNTER — Other Ambulatory Visit: Payer: Self-pay | Admitting: Family Medicine

## 2022-07-05 DIAGNOSIS — M48061 Spinal stenosis, lumbar region without neurogenic claudication: Secondary | ICD-10-CM

## 2022-07-13 ENCOUNTER — Ambulatory Visit (INDEPENDENT_AMBULATORY_CARE_PROVIDER_SITE_OTHER): Payer: Medicare Other | Admitting: Orthopaedic Surgery

## 2022-07-13 ENCOUNTER — Encounter: Payer: Self-pay | Admitting: Orthopaedic Surgery

## 2022-07-13 DIAGNOSIS — Z96652 Presence of left artificial knee joint: Secondary | ICD-10-CM

## 2022-07-13 DIAGNOSIS — M25362 Other instability, left knee: Secondary | ICD-10-CM

## 2022-07-13 NOTE — Progress Notes (Signed)
The patient is a 72 year old female sent to me from Dr. Hulan Saas to evaluate a left total knee arthroplasty that the patient states feels unstable to her.  She actually has a history of a right total hip arthroplasty as well as a right partial knee replacement and a left total knee replacement all performed by another practice in town.  She has a significant leg length discrepancy with her right side longer than her left side.  She is hoping that surgical stabilization of her left knee would help increase her leg length due to her discrepancy with the right side being longer than the left.  We had a very long and thorough discussion about leg length discrepancies and this is all related to the hip replacement and even with upsizing the polyliner on her left knee she would not gain much in terms of leg length.  I told her that from my long experience from dealing with hip replacements and knee replacements and leg length differences.  She denies any fever and chills and she denies any significant swelling with the left knee.  She does wear a knee brace to help with stability and does have a shoe buildup on her left side due to leg length discrepancy.  When I have her lay in a supine position her left side is deftly shorter than the right leg.  Her left knee on exam shows no effusion with excellent range of motion but there is some slight laxity in the exam showing the need to likely upsize her poly for stability purposes.  I did see x-rays that were performed several months ago of her left knee that shows a stable Stryker total knee arthroplasty with no complicating features or evidence of hardware loosening or failure.  I had a long and thorough discussion with her showing her knee model explaining that my recommendation would be upsizing her polyliner few sizes to give stability of that knee and explained in detail what that surgery involves including and described the risk and benefits of the surgery.   However she understands it would not address her leg length discrepancy much at all.  All questions and concerns were answered addressed.  She would still like to consider this but not until the fall of this year.  We can always see her back and 5 months to see how she is doing overall.  At that visit I would like a standing AP pelvis to assess her hips and then standing AP and lateral of the left knee.

## 2022-07-14 ENCOUNTER — Other Ambulatory Visit: Payer: Self-pay | Admitting: Family Medicine

## 2022-07-14 ENCOUNTER — Ambulatory Visit
Admission: RE | Admit: 2022-07-14 | Discharge: 2022-07-14 | Disposition: A | Discharge status: Home / Self Care | Payer: Self-pay | Source: Ambulatory Visit | Attending: Family Medicine | Admitting: Family Medicine

## 2022-07-14 DIAGNOSIS — M48061 Spinal stenosis, lumbar region without neurogenic claudication: Secondary | ICD-10-CM

## 2022-07-17 ENCOUNTER — Ambulatory Visit
Admission: RE | Admit: 2022-07-17 | Discharge: 2022-07-17 | Disposition: A | Discharge status: Home / Self Care | Payer: Medicare Other | Source: Ambulatory Visit | Attending: Family Medicine | Admitting: Family Medicine

## 2022-07-17 ENCOUNTER — Other Ambulatory Visit: Payer: Self-pay | Admitting: Family Medicine

## 2022-07-17 DIAGNOSIS — M48061 Spinal stenosis, lumbar region without neurogenic claudication: Secondary | ICD-10-CM

## 2022-07-17 NOTE — Discharge Instructions (Signed)

## 2022-07-24 ENCOUNTER — Other Ambulatory Visit: Payer: Self-pay | Admitting: Family Medicine

## 2022-07-24 ENCOUNTER — Ambulatory Visit
Admission: RE | Admit: 2022-07-24 | Discharge: 2022-07-24 | Disposition: A | Discharge status: Home / Self Care | Payer: Medicare Other | Source: Ambulatory Visit | Attending: Family Medicine | Admitting: Family Medicine

## 2022-07-24 DIAGNOSIS — M48061 Spinal stenosis, lumbar region without neurogenic claudication: Secondary | ICD-10-CM

## 2022-07-24 MED ORDER — METHYLPREDNISOLONE ACETATE 40 MG/ML INJ SUSP (RADIOLOG
80.0000 mg | Freq: Once | INTRAMUSCULAR | Status: AC
  Administered 2022-07-24: 80 mg via INTRALESIONAL

## 2022-07-24 MED ORDER — SODIUM CHLORIDE 0.9 % IV SOLN: INTRAVENOUS | Status: DC

## 2022-07-24 MED ORDER — MIDAZOLAM HCL 2 MG/2ML IJ SOLN
1.0000 mg | INTRAMUSCULAR | Status: DC | PRN
  Administered 2022-07-24 (×2): 1 mg via INTRAVENOUS

## 2022-07-24 MED ORDER — FENTANYL CITRATE PF 50 MCG/ML IJ SOSY
25.0000 ug | PREFILLED_SYRINGE | INTRAMUSCULAR | Status: DC | PRN
  Administered 2022-07-24 (×3): 25 ug via INTRAVENOUS

## 2022-07-24 MED ORDER — KETOROLAC TROMETHAMINE 30 MG/ML IJ SOLN
30.0000 mg | Freq: Once | INTRAMUSCULAR | Status: AC
  Administered 2022-07-24: 30 mg via INTRAVENOUS

## 2022-07-24 NOTE — Progress Notes (Signed)
Pt back in nursing recovery area. Pt still slightly drowsy from procedure but will wake up when spoken to. Pt follows commands, talks in complete sentences and has no complaints at this time. Pt will be monitored until discharged by Radiologist.

## 2022-07-24 NOTE — Discharge Instructions (Signed)
Radio Frequency Ablation Post Procedure Discharge Instructions  May resume a regular diet and any medications that you routinely take (including pain medications). No driving day of procedure. Upon discharge go home and rest for at least 4 hours.  May use an ice pack as needed to injection sites on back. Remove bandades later, today.    Please contact our office at [phone] for the following symptoms:  Fever greater than 100 degrees Increased swelling, pain, or redness at injection site.   Thank you for visiting [facility].  YOU MAY RESUME YOUR ASPIRIN ANYTIME AFTER PROCEDURE TODAY

## 2022-08-08 ENCOUNTER — Encounter: Payer: Self-pay | Admitting: Family Medicine

## 2022-08-16 MED ORDER — DULOXETINE HCL 20 MG PO CPEP: 20.0000 mg | ORAL_CAPSULE | Freq: Every day | ORAL | 3 refills | Status: AC

## 2022-08-28 ENCOUNTER — Other Ambulatory Visit: Payer: Self-pay

## 2022-08-29 ENCOUNTER — Other Ambulatory Visit: Payer: Self-pay

## 2022-08-29 DIAGNOSIS — R109 Unspecified abdominal pain: Secondary | ICD-10-CM

## 2022-09-01 ENCOUNTER — Other Ambulatory Visit: Payer: Self-pay | Admitting: Family Medicine

## 2022-10-02 ENCOUNTER — Ambulatory Visit
Admission: RE | Admit: 2022-10-02 | Discharge: 2022-10-02 | Disposition: A | Discharge status: Home / Self Care | Payer: Medicare Other | Source: Ambulatory Visit | Attending: Sports Medicine | Admitting: Sports Medicine

## 2022-10-02 DIAGNOSIS — R109 Unspecified abdominal pain: Secondary | ICD-10-CM

## 2022-10-02 MED ORDER — IOPAMIDOL (ISOVUE-300) INJECTION 61%
100.0000 mL | Freq: Once | INTRAVENOUS | Status: AC | PRN
  Administered 2022-10-02: 100 mL via INTRAVENOUS

## 2022-10-23 ENCOUNTER — Other Ambulatory Visit: Payer: Self-pay | Admitting: Adult Health

## 2022-11-06 ENCOUNTER — Encounter: Payer: Self-pay | Admitting: Orthopaedic Surgery

## 2022-11-06 ENCOUNTER — Other Ambulatory Visit (INDEPENDENT_AMBULATORY_CARE_PROVIDER_SITE_OTHER): Payer: Medicare Other

## 2022-11-06 ENCOUNTER — Other Ambulatory Visit: Payer: Self-pay

## 2022-11-06 ENCOUNTER — Ambulatory Visit: Payer: Medicare Other | Admitting: Orthopaedic Surgery

## 2022-11-06 DIAGNOSIS — Z96641 Presence of right artificial hip joint: Secondary | ICD-10-CM | POA: Diagnosis not present

## 2022-11-06 DIAGNOSIS — Z96652 Presence of left artificial knee joint: Secondary | ICD-10-CM

## 2022-11-06 DIAGNOSIS — M25552 Pain in left hip: Secondary | ICD-10-CM

## 2022-11-06 NOTE — Progress Notes (Signed)
The patient is someone that I am seeing in follow-up.  A colleague of mine in town his replaced her right hip.  He is also performed a partial knee replacement of the medial compartment of her right knee and a total knee replacement on the left knee.  She does have a leg length difference that has been well-documented when I saw her last.  Her right leg is much longer than the left leg and she needs a shoe buildup on the left side.  She also has ligamentous instability of the left knee and later in the year she would like to have her polyliner up sized which I agree with as well and explained this to her last visit.  She has been having left hip pain and groin pain.  She was on prednisone for a while after an upper respiratory illness and that has helped a lot of her pain calm down but she is still having pain.  Her left knee has good range of motion but it does have some laxity ligamentously.  Her right hip moves smoothly and her left hip moves smoothly but there is some pain in the groin with left hip range of motion.  There is a significant leg length discrepancy with her right side longer than the left.  An AP pelvis shows a well-seated right total hip arthroplasty.  There is no significant narrowing of her left hip.  Standing AP and lateral of her right knee shows a well-seated right total knee arthroplasty and a unicompartmental knee on the right knee seen on the standing films.  There are no complicating features of her knee arthroplasties.  At this point she would like to wait until sometime after a trip in August to consider a polyliner upsizing for her left knee due to the continued symptoms of instability she is experiencing.  We do need to obtain an MRI of her left hip to assess the cartilage in her left hip given her continued pain with that hip.  We will see her back in follow-up after having an MRI of her left hip.  This needs to be an open MRI per the patient.

## 2022-12-21 ENCOUNTER — Ambulatory Visit: Payer: Medicare Other | Admitting: Orthopaedic Surgery

## 2022-12-21 ENCOUNTER — Encounter: Payer: Self-pay | Admitting: Orthopaedic Surgery

## 2022-12-21 DIAGNOSIS — M25552 Pain in left hip: Secondary | ICD-10-CM | POA: Diagnosis not present

## 2022-12-21 MED ORDER — METHYLPREDNISOLONE 4 MG PO TABS: ORAL_TABLET | ORAL | 0 refills | Status: AC

## 2022-12-21 NOTE — Progress Notes (Signed)
The patient comes in today to go over MRI of her left hip.  She has had a right hip replacement done elsewhere as well as a right partial knee arthroplasty and a left total knee arthroplasty.  Today though most of her pain seems to be in the facet joints in the lower aspect of her lumbar spine on the left side.  She has had multiple steroid injections in radiofrequency ablations and is seen specialist for this.  On exam today her left hip moves smoothly and fluidly and has actually no issues at all.  The MRI of her left hip is also entirely normal.  There is no cartilage irregularity at all and no subchondral edema.  There is no hip joint effusion.  All the muscles and tendons appear intact as does the labrum even though this is not an arthrogram.  It seems like most of her pain is really spine related.  I recommended that at some point she may need to see a spine specialist.  She and she is having an acute flareup of pain and she is not a diabetic we did talk about a steroid taper and we will send this in for her.  Follow-up with me is as needed.  We have talked about at some point potentially upsizing the poly on her left knee due to some instability and if that becomes an issue she knows to let us know.

## 2022-12-28 ENCOUNTER — Other Ambulatory Visit (HOSPITAL_COMMUNITY): Payer: Self-pay | Admitting: Adult Health Nurse Practitioner

## 2022-12-28 DIAGNOSIS — Z1231 Encounter for screening mammogram for malignant neoplasm of breast: Secondary | ICD-10-CM

## 2023-01-10 ENCOUNTER — Ambulatory Visit (HOSPITAL_COMMUNITY)
Admission: RE | Admit: 2023-01-10 | Discharge: 2023-01-10 | Disposition: A | Discharge status: Home / Self Care | Payer: Medicare Other | Source: Ambulatory Visit | Attending: Adult Health Nurse Practitioner | Admitting: Adult Health Nurse Practitioner

## 2023-01-10 DIAGNOSIS — Z1231 Encounter for screening mammogram for malignant neoplasm of breast: Secondary | ICD-10-CM | POA: Diagnosis not present

## 2023-01-23 ENCOUNTER — Other Ambulatory Visit: Payer: Self-pay | Admitting: *Deleted

## 2023-02-08 ENCOUNTER — Telehealth: Payer: Self-pay | Admitting: Orthopaedic Surgery

## 2023-02-08 DIAGNOSIS — M48061 Spinal stenosis, lumbar region without neurogenic claudication: Secondary | ICD-10-CM

## 2023-02-08 NOTE — Telephone Encounter (Signed)
Referral placed in chart. Pt was called and advised

## 2023-02-08 NOTE — Telephone Encounter (Signed)
Pt called requesting a referral be sent to [facility] for spine specialist per conversation last office visit. Pt fax referral for Dr Marikay Alar [facility]. Pt phone number is [phone].

## 2023-02-13 ENCOUNTER — Telehealth: Payer: Self-pay | Admitting: Adult Health

## 2023-02-13 MED ORDER — ESTRADIOL 1 MG PO TABS: 1.0000 mg | ORAL_TABLET | Freq: Every day | ORAL | 0 refills | Status: DC

## 2023-02-13 NOTE — Addendum Note (Signed)
Addended by: Cyril Mourning A on: 02/13/2023 11:59 AM   Modules accepted: Orders

## 2023-02-13 NOTE — Telephone Encounter (Signed)
Refilled estrace 1 mg

## 2023-02-13 NOTE — Telephone Encounter (Signed)
Patient called asking if the estradiol had been sent to mail in pharmacy, I told her I didn't see anything. She stated that she would like to switch pharmacy and for the estradiol to be sent to [facility]  in [place] please

## 2023-02-26 ENCOUNTER — Other Ambulatory Visit: Payer: Self-pay | Admitting: Family Medicine

## 2023-02-28 ENCOUNTER — Other Ambulatory Visit: Payer: Self-pay

## 2023-02-28 ENCOUNTER — Telehealth: Payer: Self-pay | Admitting: Family Medicine

## 2023-02-28 MED ORDER — GABAPENTIN 100 MG PO CAPS: 200.0000 mg | ORAL_CAPSULE | Freq: Every day | ORAL | 0 refills | Status: DC

## 2023-02-28 NOTE — Telephone Encounter (Signed)
Patient requesting [facility] [place] ( not mail order please).

## 2023-02-28 NOTE — Telephone Encounter (Signed)
Pt informed of refill via phone.

## 2023-02-28 NOTE — Telephone Encounter (Signed)
Rx filled

## 2023-04-23 ENCOUNTER — Encounter: Payer: Self-pay | Admitting: *Deleted

## 2023-05-19 ENCOUNTER — Other Ambulatory Visit: Payer: Self-pay | Admitting: Adult Health

## 2023-06-18 ENCOUNTER — Encounter: Payer: Self-pay | Admitting: Podiatry

## 2023-06-18 ENCOUNTER — Ambulatory Visit: Payer: Medicare Other | Admitting: Podiatry

## 2023-06-18 DIAGNOSIS — B49 Unspecified mycosis: Secondary | ICD-10-CM

## 2023-06-18 DIAGNOSIS — L600 Ingrowing nail: Secondary | ICD-10-CM

## 2023-06-18 MED ORDER — CEPHALEXIN 500 MG PO CAPS: 500.0000 mg | ORAL_CAPSULE | Freq: Three times a day (TID) | ORAL | 2 refills | Status: DC

## 2023-06-18 NOTE — Progress Notes (Signed)
  Subjective:  Patient ID: Traci Johnson, female    DOB: 09-29-1950,  MRN: [id number]  Chief Complaint  Patient presents with   Nail Problem    RM#11  Bilateral big toe nail ingrown.    Discussed the use of AI scribe software for clinical note transcription with the patient, who gave verbal consent to proceed.  History of Present Illness          73 y.o. female presents with bilateral ingrown toenails, a chronic issue that has been managed with pedicures. They deny any drainage from the affected areas. The ingrown toenails have become so tender that they cannot tolerate a sheet over them. The patient also notes that their toenails have thickened over time.  The patient has a history of aspirin  use but denies taking any blood thinners. They are not diabetic and have allergies to codeine and lisinopril .   A1c 5.9     Objective:    Physical Exam         General: AAO x3, NAD  Dermatological: Incurvation present to the left and right hallux toenail, medial and borders.  There is no drainage or pus or any signs of infection observed.  No open lesions.  Nails are mildly hypertrophic, dystrophic.  No hyperpigmentation.  Vascular: Dorsalis Pedis artery and Posterior Tibial artery pedal pulses are 2/4 bilateral with immedate capillary fill time.  There is no pain with calf compression, swelling, warmth, erythema.   Neruologic: Grossly intact via light touch bilateral.   Musculoskeletal: Tenderness to the hallux ingrown toenails.  No other areas of discomfort.   No images are attached to the encounter.    Results          Assessment:   Ingrown toenail, bilateral hallux  Plan:  Patient was evaluated and treated and all questions answered.  Assessment and Plan           -Treatment options discussed including all alternatives, risks, and complications -Etiology of symptoms were discussed -At this time, the patient is requesting partial nail removal with chemical  matricectomy to the symptomatic portion of the nail. Risks and complications were discussed with the patient for which they understand and written consent was obtained. Under sterile conditions a total of 3 mL of a mixture of 2% lidocaine  plain and 0.5% Marcaine  plain was infiltrated in a hallux block fashion. Once anesthetized, the skin was prepped in sterile fashion. A tourniquet was then applied. Next the medial and lateral aspect of hallux nail border was then sharply excised making sure to remove the entire offending nail border. Once the nails were ensured to be removed area was debrided and the underlying skin was intact. There is no purulence identified in the procedure. Next phenol was then applied under standard conditions and copiously irrigated. Silvadene was applied. A dry sterile dressing was applied. After application of the dressing the tourniquet was removed and there is found to be an immediate capillary refill time to the digit. The patient tolerated the procedure well any complications. Post procedure instructions were discussed the patient for which he verbally understood. Discussed signs/symptoms of infection and directed to call the office immediately should any occur or go directly to the emergency room. In the meantime, encouraged to call the office with any questions, concerns, changes symptoms. -Keflex  -Nail sent for culture    Return in about 2 weeks (around 07/02/2023), or if symptoms worsen or fail to improve, for Nail Check.

## 2023-06-18 NOTE — Patient Instructions (Signed)

## 2023-06-19 ENCOUNTER — Telehealth: Payer: Self-pay

## 2023-06-19 ENCOUNTER — Other Ambulatory Visit: Payer: Self-pay | Admitting: Podiatry

## 2023-06-19 MED ORDER — DOXYCYCLINE HYCLATE 100 MG PO TABS: 100.0000 mg | ORAL_TABLET | Freq: Two times a day (BID) | ORAL | 0 refills | Status: DC

## 2023-06-19 NOTE — Telephone Encounter (Signed)
 Patient called at lunchtime today. She started the keflex yesterday - she was up and down all night with nausea and vomitting - Is there something else you could recommend? Thanks

## 2023-06-29 ENCOUNTER — Other Ambulatory Visit: Payer: Self-pay | Admitting: Podiatry

## 2023-07-02 ENCOUNTER — Ambulatory Visit: Payer: Medicare Other | Admitting: Podiatry

## 2023-07-02 ENCOUNTER — Encounter: Payer: Self-pay | Admitting: Podiatry

## 2023-07-02 DIAGNOSIS — R200 Anesthesia of skin: Secondary | ICD-10-CM

## 2023-07-02 DIAGNOSIS — L600 Ingrowing nail: Secondary | ICD-10-CM

## 2023-07-02 DIAGNOSIS — L603 Nail dystrophy: Secondary | ICD-10-CM

## 2023-07-02 MED ORDER — DOXYCYCLINE HYCLATE 100 MG PO TABS: 100.0000 mg | ORAL_TABLET | Freq: Two times a day (BID) | ORAL | 0 refills | Status: AC

## 2023-07-02 MED ORDER — MUPIROCIN 2 % EX OINT: 1.0000 | TOPICAL_OINTMENT | Freq: Two times a day (BID) | CUTANEOUS | 2 refills | Status: AC

## 2023-07-02 NOTE — Progress Notes (Addendum)
Subjective: Chief Complaint  Patient presents with   Nail Problem    RM#11 Patient states had bilateral ingrown removal and now infected. Patient didn't initially get to soak her feet has now started the soaks. Presents with signs of redness and tenderness.   73 year old female presents the office with above concerns.  She states that she was not able to soak her toes after the procedure and [place]  She has since started soaking.  She said they are not hurting but they were not and she is able to wear shoes.  She also additionally appointment had secondary concerns of some numbness to her left 3rd through 5th toes.  She does relate back issues and she does not want to have surgery on her back and she is asking if it could be coming from this.   Objective: AAO x3, NAD DP/PT pulses palpable bilaterally, CRT less than 3 seconds Status post partial nail avulsions.  There is localized edema and erythema without any purulence today.  There is no ascending cellulitis.  There is granulation tissue present to the nailbed on the corners.  No significant pain on exam. Unable to appreciate any tenderness palpation of the feet bilaterally.  On the left foot she is getting numbness of the toes 3 through 5.  Does not radiate. No pain with calf compression, swelling, warmth, erythema  Assessment: Status post partial nail avulsions of localized infection; neuritis  Plan:  Status post partial nail avulsions with localized infection -Refill doxycycline.  Also prescribing Pearson.  Discussed soaking Epsom salts twice a day With antibiotic ointment and a bandage. -Monitor for any clinical signs or symptoms of infection and directed to call the office immediately should any occur or go to the ER.  Onychodystrophy -I reviewed the nail culture with her.  She will continue to monitor the toenail.  Discussed urea nail gel once the procedure sites have  healed.  Numbness in toes, left -Pains could be coming from her back.  She is follow-up with Dr. In 2 weeks.  If they do not think is coming from the back to let me know and we can further evaluate.  Gabapentin seems to help and she will continue with this.    Vivi Barrack DPM

## 2023-07-02 NOTE — Patient Instructions (Addendum)
Once the procedure sites heal you can use UREA NAIL GEL on the toenails  --   Soak Instructions    THE DAY AFTER THE PROCEDURE  Place 1/4 cup of epsom salts in a quart of warm tap water.  Submerge your foot or feet with outer bandage intact for the initial soak; this will allow the bandage to become moist and wet for easy lift off.  Once you remove your bandage, continue to soak in the solution for 20 minutes.  This soak should be done twice a day.  Next, remove your foot or feet from solution, blot dry the affected area and cover.  You may use a band aid large enough to cover the area or use gauze and tape.  Apply other medications to the area as directed by the doctor such as polysporin neosporin.  IF YOUR SKIN BECOMES IRRITATED WHILE USING THESE INSTRUCTIONS, IT IS OKAY TO SWITCH TO  WHITE VINEGAR AND WATER. Or you may use antibacterial soap and water to keep the toe clean  Monitor for any signs/symptoms of infection. Call the office immediately if any occur or go directly to the emergency room. Call with any questions/concerns.

## 2023-07-16 ENCOUNTER — Ambulatory Visit: Payer: Medicare Other | Admitting: Podiatry

## 2023-07-16 NOTE — Progress Notes (Signed)
[facility] [address] [place] [address] Phone: [phone] Subjective:   Traci Johnson, am serving as a scribe for Dr. Antoine Primas.  I'm seeing this patient by the request  of:  Traci Rutherford, NP  CC:   [id number]  1/224/2024 Patient does have lumbar stenosis but seems to have more facet arthropathy causing more of the back pain at this moment.  Facet joints were helpful initially but now has not made as much improvement.  We will see if patient can respond to a medial branch block and then see if she would be a possible candidate for radiofrequency ablation.  Follow-up again 6 weeks after injection to see how patient responds.  Refilled patient's gabapentin again as well as discussed patient cyclobenzaprine for breakthrough pain.     Patient continues to have significant instability of the left knee.  Patient at this moment feels like she cannot do anything without the brace.  Wants to see if there is anything else that can be done.  Will refer her to orthopedic surgery to discuss if any other surgical intervention could be potentially helpful. Bone scan of the left knee did not show any significant loosening.  Update 07/17/2023 Traci Johnson is a 73 y.o. female coming in with complaint of lumbar spine pain. Last seen in January 2024.  Patient was referred to spine specialist.  Patient states that pain is primarily on L side. Numbness in L foot in toes 3-5. Also c/o pain in coccyx. Has had injections previously which were helpful. Vacation to [place] April 6th.   Patient did have the MRI of the lumbar spine in October 2024.  This was independently visualized by me showing the patient did have facet arthropathy done.  Patient does not also have what seems to be severe multi foot it is some muscle atrophy at L4-S1.  Past Medical History:  Diagnosis Date   Allergy    Arthritis    Asthma    Chronic fatigue    COVID-19    Depression     Hyperparathyroidism (HCC)    Hypertension    Migraines    cluster   Sleep apnea    Past Surgical History:  Procedure Laterality Date   ABDOMINAL HYSTERECTOMY  1985   CHOLECYSTECTOMY N/A 05/02/2017   Procedure: LAPAROSCOPIC CHOLECYSTECTOMY;  Surgeon: Rodman Pickle, MD;  Location: [facility];  Service: General;  Laterality: N/A;   LEFT HEART CATH AND CORONARY ANGIOGRAPHY N/A 04/30/2017   Procedure: LEFT HEART CATH AND CORONARY ANGIOGRAPHY;  Surgeon: Runell Gess, MD;  Location: [facility];  Service: Cardiovascular;  Laterality: N/A;   LIGAMENT REPAIR Right    birth defect   PARATHYROIDECTOMY Left 03/10/2021   Procedure: LEFT INFERIOR PARATHYROIDECTOMY;  Surgeon: Traci Level, MD;  Location: [facility];  Service: General;  Laterality: Left;   REPLACEMENT TOTAL KNEE BILATERAL     TOTAL HIP ARTHROPLASTY Right    Social History   Socioeconomic History   Marital status: Married    Spouse name: Not on file   Number of children: Not on file   Years of education: Not on file   Highest education Johnson: Not on file  Occupational History   Occupation: Architect  Tobacco Use   Smoking status: Never   Smokeless tobacco: Never  Vaping Use   Vaping status: Never Used  Substance and Sexual Activity   Alcohol use: Yes    Alcohol/week: 0.0 standard drinks of alcohol  Comment: rare   Drug use: Never   Sexual activity: Not Currently    Birth control/protection: Surgical    Comment: hysto  Other Topics Concern   Not on file  Social History Narrative   ** Merged History Encounter **       Social Drivers of Health   Financial Resource Strain: Low Risk  (04/23/2023)   Received from [facility]   Overall Financial Resource Strain (CARDIA)    Difficulty of Paying Living Expenses: Not hard at all  Food Insecurity: No Food Insecurity (04/23/2023)   Received from [facility]   Hunger Vital Sign    Worried About Running Out of Food in the Last Year: Never true     Ran Out of Food in the Last Year: Never true  Transportation Needs: No Transportation Needs (04/23/2023)   Received from [facility] - Transportation    Lack of Transportation (Medical): No    Lack of Transportation (Non-Medical): No  Physical Activity: Insufficiently Active (04/23/2023)   Received from [facility]   Exercise Vital Sign    Days of Exercise per Week: 3 days    Minutes of Exercise per Session: 20 min  Stress: No Stress Concern Present (04/23/2023)   Received from [facility] - Occupational Stress Questionnaire    Feeling of Stress : Not at all  Social Connections: Moderately Integrated (04/23/2023)   Received from [facility]   Social Network    How would you rate your social network (family, work, friends)?: Adequate participation with social networks   Allergies  Allergen Reactions   Codeine Nausea And Vomiting   Keflex [Cephalexin] Nausea And Vomiting   Lisinopril Cough   Family History  Problem Relation Age of Onset   Cancer Paternal Grandfather    Heart disease Paternal Grandmother    Cancer Maternal Grandfather    Cancer Father    Dementia Mother    Cancer Mother    Hypertension Brother    Cancer Son        testicular & lung   CAD Neg Hx     Current Outpatient Medications (Endocrine & Metabolic):    estradiol (ESTRACE) 1 MG tablet, TAKE 1 TABLET BY MOUTH EVERY DAY   methylPREDNISolone (MEDROL) 4 MG tablet, Medrol dose pack. Take as instructed   predniSONE (DELTASONE) 20 MG tablet, Take 1 tablet (20 mg total) by mouth daily with breakfast.  Current Outpatient Medications (Cardiovascular):    amLODipine (NORVASC) 5 MG tablet, Take 5 mg by mouth daily.   hydrochlorothiazide (HYDRODIURIL) 25 MG tablet, Take 25 mg by mouth daily.   metoprolol succinate (TOPROL-XL) 25 MG 24 hr tablet, Take 12.5 mg by mouth daily.  Current Outpatient Medications (Respiratory):    albuterol (PROVENTIL  HFA;VENTOLIN HFA) 108 (90 BASE) MCG/ACT inhaler, Inhale 2 puffs into the lungs every 6 (six) hours as needed for wheezing.   fluticasone-salmeterol (ADVAIR) 250-50 MCG/ACT AEPB, Inhale 1 puff into the lungs in the morning and at bedtime.   montelukast (SINGULAIR) 10 MG tablet, Take 1 tablet (10 mg total) by mouth at bedtime.  Current Outpatient Medications (Analgesics):    aspirin 81 MG tablet, Take 81 mg by mouth daily.   meloxicam (MOBIC) 15 MG tablet, TAKE ONE TABLET BY MOUTH ONCE DAILY   traMADol (ULTRAM) 50 MG tablet, Take 1-2 tablets (50-100 mg total) by mouth every 6 (six) hours as needed for moderate pain.   Current Outpatient Medications (Other):  Cholecalciferol (VITAMIN D3) 5000 units CAPS, Take 5,000 Units by mouth daily.   cyclobenzaprine (FLEXERIL) 5 MG tablet, TAKE ONE TABLET BY MOUTH THREE TIMES DAILY AS NEEDED FOR MUSCLE SPASM   doxycycline (VIBRA-TABS) 100 MG tablet, Take 1 tablet (100 mg total) by mouth 2 (two) times daily.   DULoxetine (CYMBALTA) 20 MG capsule, Take 1 capsule (20 mg total) by mouth daily.   escitalopram (LEXAPRO) 5 MG tablet, Take 5 mg by mouth daily.   gabapentin (NEURONTIN) 100 MG capsule, Take 2 capsules (200 mg total) by mouth at bedtime.   mupirocin ointment (BACTROBAN) 2 %, Apply 1 Application topically 2 (two) times daily.   OVER THE COUNTER MEDICATION, Take 1 capsule by mouth in the morning and at bedtime. Super Beets   Potassium 99 MG TABS, Take 1 tablet by mouth daily.   Probiotic Product (PROBIOTIC PO), Take by mouth.   Vitamin A 2400 MCG (8000 UT) CAPS, Take 8,000 Units by mouth daily.   vitamin E 180 MG (400 UNITS) capsule, Take 400 Units by mouth daily.   zinc gluconate 50 MG tablet, Take 50 mg by mouth daily.   Reviewed prior external information including notes and imaging from  primary care provider As well as notes that were available from care everywhere and other healthcare systems.  Past medical history, social, surgical and  family history all reviewed in electronic medical record.  No pertanent information unless stated regarding to the chief complaint.   Review of Systems:  No headache, visual changes, nausea, vomiting, diarrhea, constipation, dizziness, abdominal pain, skin rash, fevers, chills, night sweats, weight loss, swollen lymph nodes, chest pain, shortness of breath, mood changes. POSITIVE muscle aches, body aches, joint swelling  Objective  Blood pressure 130/78, pulse 95, height 5\' 4"  (1.626 m), weight 216 lb (98 kg), SpO2 97%.   General: No apparent distress alert and oriented x3 mood and affect normal, dressed appropriately.  Patient is tearful HEENT: Pupils equal, extraocular movements intact  Respiratory: Patient's speak in full sentences and does not appear short of breath  Cardiovascular: No lower extremity edema, non tender, no erythema  Low back exam shows patient is severely tender to palpation especially over the sacroiliac joint and the paraspinal musculature of the lumbar spine. Extension greater than 5 degrees seems to cause severe amount of discomfort and pain.   After verbal consent patient was prepped with alcohol swab and with a 21-gauge 2 inch needle injected into the left sacroiliac joint with a total of 0.5 cc of 0.5% Marcaine and 1 cc of Kenalog 40 mg/mL.  No blood loss, Band-Aid placed.  Postinjection instructions given   Impression and Recommendations:     The above documentation has been reviewed and is accurate and complete Judi Saa, DO

## 2023-07-17 ENCOUNTER — Encounter: Payer: Self-pay | Admitting: Family Medicine

## 2023-07-17 ENCOUNTER — Ambulatory Visit: Payer: Medicare Other | Admitting: Family Medicine

## 2023-07-17 ENCOUNTER — Other Ambulatory Visit: Payer: Self-pay

## 2023-07-17 VITALS — BP 130/78 | HR 95 | Ht 64.0 in | Wt 216.0 lb

## 2023-07-17 DIAGNOSIS — M545 Low back pain, unspecified: Secondary | ICD-10-CM

## 2023-07-17 DIAGNOSIS — M533 Sacrococcygeal disorders, not elsewhere classified: Secondary | ICD-10-CM

## 2023-07-17 DIAGNOSIS — G8929 Other chronic pain: Secondary | ICD-10-CM | POA: Diagnosis not present

## 2023-07-17 DIAGNOSIS — M47816 Spondylosis without myelopathy or radiculopathy, lumbar region: Secondary | ICD-10-CM | POA: Diagnosis not present

## 2023-07-17 MED ORDER — PREDNISONE 20 MG PO TABS: 20.0000 mg | ORAL_TABLET | Freq: Every day | ORAL | 0 refills | Status: AC

## 2023-07-17 MED ORDER — GABAPENTIN 100 MG PO CAPS: 200.0000 mg | ORAL_CAPSULE | Freq: Every day | ORAL | 0 refills | Status: DC

## 2023-07-17 MED ORDER — MONTELUKAST SODIUM 10 MG PO TABS: 10.0000 mg | ORAL_TABLET | Freq: Every day | ORAL | 1 refills | Status: AC

## 2023-07-17 NOTE — Assessment & Plan Note (Signed)
Patient given injection today and tolerated the procedure well, discussed icing regimen at home exercises, discussed which activities to do and which ones to avoid.  Could be giving her some of her radicular symptoms as well.  Follow-up again in 6 to 8 weeks.

## 2023-07-17 NOTE — Assessment & Plan Note (Addendum)
Facet arthropathy that I do think is causing more the discomfort and pain in the back.  Patient has seen other providers and they have discussed the possibility of surgical intervention.  Discussed icing regimen and home exercises, discussed which activities to do and which ones to avoid.  We discussed the possibility of with the patient responding to the L4-L5 radiofrequency ablation a year ago to consider the L3-L4 and potentially even L5-S1 if the interventional radiologist felt like this would be appropriate.  Will try a medial branch block first.  Attempted a sacroiliac joint injection as well for diagnostic and therapeutic purposes.  Follow-up again in 6 to 8 weeks otherwise.  Gabapentin 200 mg to take at night given as well.

## 2023-07-17 NOTE — Patient Instructions (Addendum)
Inject SI joint today MBB is done at [facility] [phone] Prednisone called in for your trip Gabapentin and Singular refilled See me again in 2 months

## 2023-07-23 ENCOUNTER — Other Ambulatory Visit: Payer: Self-pay | Admitting: Podiatry

## 2023-07-23 NOTE — Discharge Instructions (Signed)
Medial Branch Block Discharge Instructions  Take over-the-counter and prescription medicines only as told by your health care provider.  Do not drive the day of your procedure  Return to your normal activities as told by your health care provider.   If injection site is sore you may ice the area for 20 minutes, 2-3 times a day.   Check your injection site every day for signs of infection. Check for: Redness, swelling, or pain. Fluid or blood. Warmth. Pus or a bad smell.  Please contact our office at [phone] if: You have a fever or chills. You have any signs of infection. You develop any numbness or weakness.   Thank you for visiting our office.  

## 2023-07-24 ENCOUNTER — Other Ambulatory Visit: Payer: Self-pay | Admitting: Family Medicine

## 2023-07-24 ENCOUNTER — Ambulatory Visit
Admission: RE | Admit: 2023-07-24 | Discharge: 2023-07-24 | Disposition: A | Discharge status: Home / Self Care | Payer: Medicare Other | Source: Ambulatory Visit | Attending: Family Medicine | Admitting: Family Medicine

## 2023-07-24 ENCOUNTER — Other Ambulatory Visit: Payer: Self-pay | Admitting: *Deleted

## 2023-07-24 DIAGNOSIS — M545 Low back pain, unspecified: Secondary | ICD-10-CM

## 2023-07-24 MED ORDER — ESTRADIOL 1 MG PO TABS: 1.0000 mg | ORAL_TABLET | Freq: Every day | ORAL | 0 refills | Status: DC

## 2023-08-03 ENCOUNTER — Telehealth: Payer: Self-pay | Admitting: Family Medicine

## 2023-08-03 NOTE — Telephone Encounter (Signed)
 Patient called stating that she saw Dr Charise Killian to see if the ablation would help but that procedure did not help. He advised her to check with Dr Katrinka Blazing to see what next steps would be.

## 2023-08-06 NOTE — Telephone Encounter (Signed)
 Sent patient a [facility] geologist

## 2023-08-07 ENCOUNTER — Other Ambulatory Visit: Payer: Self-pay | Admitting: Family Medicine

## 2023-08-07 DIAGNOSIS — M545 Low back pain, unspecified: Secondary | ICD-10-CM

## 2023-08-16 NOTE — Discharge Instructions (Signed)

## 2023-08-17 ENCOUNTER — Ambulatory Visit
Admission: RE | Admit: 2023-08-17 | Discharge: 2023-08-17 | Disposition: A | Discharge status: Home / Self Care | Source: Ambulatory Visit | Attending: Family Medicine | Admitting: Family Medicine

## 2023-08-17 DIAGNOSIS — M545 Low back pain, unspecified: Secondary | ICD-10-CM

## 2023-08-17 MED ORDER — METHYLPREDNISOLONE ACETATE 40 MG/ML INJ SUSP (RADIOLOG
80.0000 mg | Freq: Once | INTRAMUSCULAR | Status: AC
  Administered 2023-08-17: 80 mg via EPIDURAL

## 2023-08-17 MED ORDER — IOPAMIDOL (ISOVUE-M 200) INJECTION 41%
1.0000 mL | Freq: Once | INTRAMUSCULAR | Status: AC
  Administered 2023-08-17: 1 mL via EPIDURAL

## 2023-09-05 ENCOUNTER — Ambulatory Visit: Payer: Medicare Other | Admitting: Family Medicine

## 2023-10-01 ENCOUNTER — Encounter: Payer: Self-pay | Admitting: Family Medicine

## 2023-10-01 DIAGNOSIS — R2689 Other abnormalities of gait and mobility: Secondary | ICD-10-CM

## 2023-10-01 DIAGNOSIS — M1710 Unilateral primary osteoarthritis, unspecified knee: Secondary | ICD-10-CM

## 2023-10-01 DIAGNOSIS — R279 Unspecified lack of coordination: Secondary | ICD-10-CM

## 2023-10-08 ENCOUNTER — Other Ambulatory Visit: Payer: Self-pay | Admitting: Family Medicine

## 2023-10-08 ENCOUNTER — Other Ambulatory Visit: Payer: Self-pay | Admitting: Adult Health

## 2023-10-09 ENCOUNTER — Ambulatory Visit: Attending: Family Medicine

## 2023-10-09 ENCOUNTER — Ambulatory Visit: Admitting: Podiatry

## 2023-10-09 ENCOUNTER — Encounter: Payer: Self-pay | Admitting: Podiatry

## 2023-10-09 DIAGNOSIS — L600 Ingrowing nail: Secondary | ICD-10-CM | POA: Diagnosis not present

## 2023-10-09 DIAGNOSIS — R2689 Other abnormalities of gait and mobility: Secondary | ICD-10-CM | POA: Diagnosis present

## 2023-10-09 DIAGNOSIS — R279 Unspecified lack of coordination: Secondary | ICD-10-CM | POA: Insufficient documentation

## 2023-10-09 DIAGNOSIS — M1710 Unilateral primary osteoarthritis, unspecified knee: Secondary | ICD-10-CM | POA: Insufficient documentation

## 2023-10-09 DIAGNOSIS — Z9181 History of falling: Secondary | ICD-10-CM | POA: Diagnosis present

## 2023-10-09 NOTE — Patient Instructions (Signed)

## 2023-10-09 NOTE — Therapy (Signed)
 OUTPATIENT PHYSICAL THERAPY LOWER EXTREMITY EVALUATION   Patient Name: Traci Johnson MRN: [id number] DOB:Jul 26, 1950, 73 y.o., female Today's Date: 10/09/2023  END OF SESSION:  PT End of Session - 10/09/23 1000     Visit Number 1    Number of Visits 12    Date for PT Re-Evaluation 12/28/23    PT Start Time 1017    PT Stop Time 1053    PT Time Calculation (min) 36 min    Activity Tolerance Patient tolerated treatment well    Behavior During Therapy WFL for tasks assessed/performed             Past Medical History:  Diagnosis Date   Allergy    Arthritis    Asthma    Chronic fatigue    COVID-19    Depression    Hyperparathyroidism (HCC)    Hypertension    Migraines    cluster   Sleep apnea    Past Surgical History:  Procedure Laterality Date   ABDOMINAL HYSTERECTOMY  1985   CHOLECYSTECTOMY N/A 05/02/2017   Procedure: LAPAROSCOPIC CHOLECYSTECTOMY;  Surgeon: Derral Flick, MD;  Location: [facility];  Service: General;  Laterality: N/A;   LEFT HEART CATH AND CORONARY ANGIOGRAPHY N/A 04/30/2017   Procedure: LEFT HEART CATH AND CORONARY ANGIOGRAPHY;  Surgeon: Avanell Leigh, MD;  Location: [facility];  Service: Cardiovascular;  Laterality: N/A;   LIGAMENT REPAIR Right    birth defect   PARATHYROIDECTOMY Left 03/10/2021   Procedure: LEFT INFERIOR PARATHYROIDECTOMY;  Surgeon: Oralee Billow, MD;  Location: [facility];  Service: General;  Laterality: Left;   REPLACEMENT TOTAL KNEE BILATERAL     TOTAL HIP ARTHROPLASTY Right    Patient Active Problem List   Diagnosis Date Noted   Facet arthropathy, lumbar 07/17/2023   Chronic left sacroiliac joint pain 07/17/2023   Lumbar trigger point syndrome 04/12/2022   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) 02/14/2022   Painful total knee replacement, left (HCC) 02/01/2022   Acquired leg length discrepancy 08/23/2021   Degenerative lumbar spinal stenosis 08/23/2021   Rectocele 07/07/2021   S/P hysterectomy  07/07/2021   Hyperparathyroidism, primary (HCC) 03/06/2021   Encounter for screening fecal occult blood testing 05/13/2020   Pelvic relaxation 05/13/2020   Encounter for well woman exam with routine gynecological exam 03/25/2018   Screening for colorectal cancer 03/25/2018   Fecal occult blood test positive 03/25/2018   Current use of estrogen therapy 03/25/2018   Orthostatic hypotension 05/03/2017   Syncope due to orthostatic hypotension 05/03/2017   Symptomatic cholelithiasis 05/02/2017   Acute cholecystitis 05/02/2017   Abnormal nuclear stress test    Chest pain 04/27/2017   Hyperglycemia 04/27/2017   Anemia 04/27/2017   Essential hypertension 07/15/2014   Mild persistent asthma without complication 07/15/2014   Vitamin D  deficiency 07/15/2014    PCP: Lorre Rosin, NP  REFERRING PROVIDER: Isidro Margo, DO   REFERRING DIAG: Arthritis of knee, Balance problem, Coordination problem   THERAPY DIAG:  Other abnormalities of gait and mobility  History of falling  Rationale for Evaluation and Treatment: Rehabilitation  ONSET DATE: chronic  SUBJECTIVE:   SUBJECTIVE STATEMENT: Patient reports that her knees have bothering her for a long time. She has had knee replacements in both knees, but her knees are still very stiff. Her knees are hurting more now than it has been. She has fallen multiple times with most of these falls being on uneven terrain. She feels fine on flat surfaces, but the uneven  surfaces really bother her. Her last fall was about 2 weeks ago.   PERTINENT HISTORY: Hypertension, asthma, allergies, arthritis, history of COVID-19, and depression PAIN:  Are you having pain? Yes: NPRS scale: Current: 0/*10 Worst 8/10 Pain location: both knees  Pain description: stiff Aggravating factors: walking (10 minutes without walking sticks and 30 minutes with them)  Relieving factors: rest  PRECAUTIONS: Fall  RED FLAGS: None   WEIGHT BEARING RESTRICTIONS:  No  FALLS:  Has patient fallen in last 6 months? Yes. Number of falls 3-4  LIVING ENVIRONMENT: Lives with: lives alone Lives in: House/apartment Stairs: Yes: External: 2 steps; none; step to pattern Has following equipment at home: None  OCCUPATION: retired  PLOF: Independent  PATIENT GOALS: improved safety and reduced fall risk  NEXT MD VISIT: none scheduled  OBJECTIVE:  Note: Objective measures were completed at Evaluation unless otherwise noted.  PATIENT SURVEYS:  LEFS 18/80  COGNITION: Overall cognitive status: Within functional limits for tasks assessed     SENSATION: Patient reports no numbness or tingling  PALPATION: No tenderness to palpation reported  LOWER EXTREMITY ROM:  Active ROM Right eval Left eval  Hip flexion    Hip extension    Hip abduction    Hip adduction    Hip internal rotation    Hip external rotation    Knee flexion 130 126  Knee extension 6 3  Ankle dorsiflexion    Ankle plantarflexion    Ankle inversion    Ankle eversion     (Blank rows = not tested)  LOWER EXTREMITY MMT:  MMT Right eval Left eval  Hip flexion 4-/5 4-/5  Hip extension    Hip abduction    Hip adduction    Hip internal rotation    Hip external rotation    Knee flexion 4+/5 4+/5  Knee extension 4+/5 4/5  Ankle dorsiflexion 3/5 3/5  Ankle plantarflexion    Ankle inversion    Ankle eversion     (Blank rows = not tested)  FUNCTIONAL TESTS:  5 times sit to stand: 21.59 second without UE support Timed up and go (TUG): 14.97 seconds  GAIT: Assistive device utilized: None Level of assistance: Complete Independence Comments: decreased stride length, gait speed, and reduced knee flexion with swing phase bilaterally                                                                                                                              TREATMENT DATE:     PATIENT EDUCATION:  Education details: Plan of care, prognosis, objective findings, and goals  for physical therapy Person educated: Patient Education method: Explanation Education comprehension: verbalized understanding  HOME EXERCISE PROGRAM:   ASSESSMENT:  CLINICAL IMPRESSION: Patient is a 73 y.o. female who was seen today for physical therapy evaluation and treatment for gait deviations and a history of falling. She is a high fall risk as evidenced by her objective testing, gait mechanics, and her history  of falling. Recommend that she continue with skilled physical therapy to address her impairments to maximize her safety and functional mobility.  OBJECTIVE IMPAIRMENTS: Abnormal gait, decreased activity tolerance, decreased balance, decreased mobility, difficulty walking, decreased strength, and pain.   ACTIVITY LIMITATIONS: carrying, lifting, standing, squatting, stairs, transfers, and locomotion level  PARTICIPATION LIMITATIONS: meal prep, cleaning, laundry, shopping, community activity, and yard work  PERSONAL FACTORS: Age, Past/current experiences, Time since onset of injury/illness/exacerbation, and 3+ comorbidities: Hypertension, asthma, allergies, arthritis, history of COVID-19, and depression  are also affecting patient's functional outcome.   REHAB POTENTIAL: Good  CLINICAL DECISION MAKING: Evolving/moderate complexity  EVALUATION COMPLEXITY: Moderate   GOALS: Goals reviewed with patient? Yes  SHORT TERM GOALS: Target date: 10/30/23 Patient will be independent with her initial HEP. Baseline: Goal status: INITIAL  2.  Patient will improve her 5 times sit to stand time to 16 seconds or less for improved lower extremity power. Baseline:  Goal status: INITIAL  3.  Patient will improve her bilateral hip flexor strength to at least 4/5 for improved functional mobility. Baseline:  Goal status: INITIAL  LONG TERM GOALS: Target date: 11/20/23  Patient will be independent with her advanced HEP. Baseline:  Goal status: INITIAL  2.  Patient will improve her 5  times sit to stand tab to 12 seconds or less to reduce her fall risk. Baseline:  Goal status: INITIAL  3.  Patient will improve her timed up and go time to 12 seconds or less to reduce her fall risk. Baseline:  Goal status: INITIAL  4.  Patient will improve her LEFS score to at least 35/80 for improved perceived function with her daily activities. Baseline:  Goal status: INITIAL  PLAN:  PT FREQUENCY: 2x/week  PT DURATION: 6 weeks  PLANNED INTERVENTIONS: 40981- PT Re-evaluation, 97750- Physical Performance Testing, 97110-Therapeutic exercises, 97530- Therapeutic activity, 97112- Neuromuscular re-education, 97535- Self Care, 19147- Manual therapy, [phone]- Gait training, [phone]- Electrical stimulation (unattended), 97016- Vasopneumatic device, Patient/Family education, Balance training, Stair training, Joint mobilization, Cryotherapy, and Moist heat  PLAN FOR NEXT SESSION: NuStep, lower extremity strengthening, balance interventions, gait training   Lane Pinon, PT 10/09/2023, 12:48 PM

## 2023-10-09 NOTE — Progress Notes (Unsigned)
Subjective:     Right Foot  Left Foot  Patient presents today for left foot P&A procedure.  Chief Complaint   Patient presents with    Left Foot - Follow-up, Nail Problem     Left foot 1st toenail P&A.        Patient ID: Traci Johnson is a 73 y.o. female.  Patient Active Problem List   Diagnosis Code    Anxiety F41.9    Arthritis M19.90    GERD (gastroesophageal reflux disease) K21.9    Hyperlipidemia E78.5    Lipoma D17.9    Onychomycosis B35.1    Nevus D22.9    Trigger middle finger of left hand M65.332    Varicose vein of leg I83.90     Current Outpatient Medications   Medication    atorvastatin (LIPITOR) 40 MG tablet    urea (CARMOL) 40 % cream    Non-System Medication    calcium carbonate-vitamin D 600-400 MG-UNIT per tablet    OMEGA-3 FATTY ACIDS PO    fluticasone (FLONASE) 50 MCG/ACT nasal spray    ibuprofen (ADVIL,MOTRIN) 200 MG tablet    Salicylic Acid (COMPOUND W EX)    Multiple Vitamin (MULTIVITAMIN) capsule    Neomycin-Polymyxin-Dexameth (DEXACINE) 3.5-10000-0.1 OINT    citalopram (CELEXA) 20 MG tablet    ESTRACE VAGINAL 0.1 MG/GM vaginal cream    omeprazole (PRILOSEC) 20 MG capsule     No current facility-administered medications for this visit.     Allergy History as of 07/25/18       HYDROCODONE-ACETAMINOPHEN         Noted Status Severity Type Reaction    11/19/15 1705 Truitt Merle 12/18/12 Active Low  Nausea And Vomiting, Other (See Comments)    Comments:  Stomach pain      11/17/15 1514 Lindell Noe, [place] 11/17/15 Active   Nausea And Vomiting              AMOXICILLIN         Noted Status Severity Type Reaction    11/19/15 1705 Truitt Merle 11/17/15 Active Low Intolerance Nausea And Vomiting, Other (See Comments)    Comments:  Reaction: dizziness / stomach pains     11/17/15 1515 Lindell Noe, [place] 11/17/15 Active  Intolerance Nausea And Vomiting                              Review of systems:  Constitution: The patient appears well nourished and well developed appropriate for  age  Body mass index is 27.04 kg/m.    Psychiatric:  Alert and oriented to person place and time  Appears of normal mood and affect     Musculoskeletal: Osteoarthritis  Skin: Onychomycosis, porokeratosis  Neuro: Unremarkable   Cardiovascular: Varicosities     PMH, Medications, Allergies, Social history, Surgical history, and Family history were personally reviewed and documented in the chart.        Objective:   Physical Exam       Vasc:  DP: 2/4, PT: 1/4 bilaterally.  Skin temperature, color & texture are within normal limits.   There are no varicosities, edema, or atrophic changes noted.   Positive digital hair growth is noted bilaterally.  Capillary filling time is less than 3 seconds bilaterally.     Derm:    The patient does have a tender ingrowing left hallux nail.    Ortho:  Bilateral rectus foot type with no  evidence of contracted toes,  bunion deformities, or limb length discrepancy.     Neuro:  Light touch & vibratory sensation are fully intact.   Deep tendon reflexes are normal bilaterally.  Proprioception is within normal limits bilaterally.        Assessment:      1. Ingrowing nail        2. Pain in left foot               Plan:  Timeout called for left hallux medial border: The toe was anesthetized today with lidocaine.  Once anesthesia was achieved, the offending border was avulsed.  Antibiotic ointment was applied to protect surrounding skin.  The area was then treated with 3 applications of phenol for the matrixectomy.  The area was then copiously flushed with alcohol.  The area was then packed with antibiotic ointment and covered with gauze and Coban.  The patient was given soaking instructions.

## 2023-10-16 ENCOUNTER — Ambulatory Visit

## 2023-10-16 DIAGNOSIS — R2689 Other abnormalities of gait and mobility: Secondary | ICD-10-CM | POA: Diagnosis not present

## 2023-10-16 DIAGNOSIS — Z9181 History of falling: Secondary | ICD-10-CM

## 2023-10-16 NOTE — Therapy (Signed)
 OUTPATIENT PHYSICAL THERAPY LOWER EXTREMITY EVALUATION   Patient Name: Traci Johnson MRN: [id number] DOB:12-Nov-1950, 73 y.o., female Today's Date: 10/16/2023  END OF SESSION:  PT End of Session - 10/16/23 1019     Visit Number 2    Number of Visits 12    Date for PT Re-Evaluation 12/28/23    PT Start Time 1015    PT Stop Time 1104    PT Time Calculation (min) 49 min    Activity Tolerance Patient tolerated treatment well    Behavior During Therapy WFL for tasks assessed/performed             Past Medical History:  Diagnosis Date   Allergy    Arthritis    Asthma    Chronic fatigue    COVID-19    Depression    Hyperparathyroidism (HCC)    Hypertension    Migraines    cluster   Sleep apnea    Past Surgical History:  Procedure Laterality Date   ABDOMINAL HYSTERECTOMY  1985   CHOLECYSTECTOMY N/A 05/02/2017   Procedure: LAPAROSCOPIC CHOLECYSTECTOMY;  Surgeon: Derral Flick, MD;  Location: [facility];  Service: General;  Laterality: N/A;   LEFT HEART CATH AND CORONARY ANGIOGRAPHY N/A 04/30/2017   Procedure: LEFT HEART CATH AND CORONARY ANGIOGRAPHY;  Surgeon: Avanell Leigh, MD;  Location: [facility];  Service: Cardiovascular;  Laterality: N/A;   LIGAMENT REPAIR Right    birth defect   PARATHYROIDECTOMY Left 03/10/2021   Procedure: LEFT INFERIOR PARATHYROIDECTOMY;  Surgeon: Oralee Billow, MD;  Location: [facility];  Service: General;  Laterality: Left;   REPLACEMENT TOTAL KNEE BILATERAL     TOTAL HIP ARTHROPLASTY Right    Patient Active Problem List   Diagnosis Date Noted   Facet arthropathy, lumbar 07/17/2023   Chronic left sacroiliac joint pain 07/17/2023   Lumbar trigger point syndrome 04/12/2022   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) 02/14/2022   Painful total knee replacement, left (HCC) 02/01/2022   Acquired leg length discrepancy 08/23/2021   Degenerative lumbar spinal stenosis 08/23/2021   Rectocele 07/07/2021   S/P hysterectomy  07/07/2021   Hyperparathyroidism, primary (HCC) 03/06/2021   Encounter for screening fecal occult blood testing 05/13/2020   Pelvic relaxation 05/13/2020   Encounter for well woman exam with routine gynecological exam 03/25/2018   Screening for colorectal cancer 03/25/2018   Fecal occult blood test positive 03/25/2018   Current use of estrogen therapy 03/25/2018   Orthostatic hypotension 05/03/2017   Syncope due to orthostatic hypotension 05/03/2017   Symptomatic cholelithiasis 05/02/2017   Acute cholecystitis 05/02/2017   Abnormal nuclear stress test    Chest pain 04/27/2017   Hyperglycemia 04/27/2017   Anemia 04/27/2017   Essential hypertension 07/15/2014   Mild persistent asthma without complication 07/15/2014   Vitamin D  deficiency 07/15/2014    PCP: Lorre Rosin, NP  REFERRING PROVIDER: Isidro Margo, DO   REFERRING DIAG: Arthritis of knee, Balance problem, Coordination problem   THERAPY DIAG:  Other abnormalities of gait and mobility  History of falling  Rationale for Evaluation and Treatment: Rehabilitation  ONSET DATE: chronic  SUBJECTIVE:   SUBJECTIVE STATEMENT: Pt reports 7/10 bil knee pain today.   PERTINENT HISTORY: Hypertension, asthma, allergies, arthritis, history of COVID-19, and depression PAIN:  Are you having pain? Yes: NPRS scale: 7/10 Pain location: both knees  Pain description: stiff Aggravating factors: walking (10 minutes without walking sticks and 30 minutes with them)  Relieving factors: rest  PRECAUTIONS: Fall  RED  FLAGS: None   WEIGHT BEARING RESTRICTIONS: No  FALLS:  Has patient fallen in last 6 months? Yes. Number of falls 3-4  LIVING ENVIRONMENT: Lives with: lives alone Lives in: House/apartment Stairs: Yes: External: 2 steps; none; step to pattern Has following equipment at home: None  OCCUPATION: retired  PLOF: Independent  PATIENT GOALS: improved safety and reduced fall risk  NEXT MD VISIT: none  scheduled  OBJECTIVE:  Note: Objective measures were completed at Evaluation unless otherwise noted.  PATIENT SURVEYS:  LEFS 18/80  COGNITION: Overall cognitive status: Within functional limits for tasks assessed     SENSATION: Patient reports no numbness or tingling  PALPATION: No tenderness to palpation reported  LOWER EXTREMITY ROM:  Active ROM Right eval Left eval  Hip flexion    Hip extension    Hip abduction    Hip adduction    Hip internal rotation    Hip external rotation    Knee flexion 130 126  Knee extension 6 3  Ankle dorsiflexion    Ankle plantarflexion    Ankle inversion    Ankle eversion     (Blank rows = not tested)  LOWER EXTREMITY MMT:  MMT Right eval Left eval  Hip flexion 4-/5 4-/5  Hip extension    Hip abduction    Hip adduction    Hip internal rotation    Hip external rotation    Knee flexion 4+/5 4+/5  Knee extension 4+/5 4/5  Ankle dorsiflexion 3/5 3/5  Ankle plantarflexion    Ankle inversion    Ankle eversion     (Blank rows = not tested)  FUNCTIONAL TESTS:  5 times sit to stand: 21.59 second without UE support Timed up and go (TUG): 14.97 seconds  GAIT: Assistive device utilized: None Level of assistance: Complete Independence Comments: decreased stride length, gait speed, and reduced knee flexion with swing phase bilaterally                                                                                                                              TREATMENT DATE:    10/16/23                                  EXERCISE LOG  Exercise Repetitions and Resistance Comments  Nustep Lvl 3 x 15 mins   Rockerboard 3 mins   Lunges 14" box x 2 mins each LE   Side-stepping Airex x 4.5 mins   LAQs 2# x 20 reps bil   Seated Marches 2# x 20 reps bil   Seated Hip Abduction Red x 3 mins   Seated Ham Curls Red x 20 reps bil   STS     Blank cell = exercise not performed today   PATIENT EDUCATION:  Education details: Plan of care,  prognosis, objective findings, and goals for physical therapy Person educated: Patient Education method: Explanation Education  comprehension: verbalized understanding  HOME EXERCISE PROGRAM:   ASSESSMENT:  CLINICAL IMPRESSION: Pt arrives for today's treatment session reporting 7/10 bil knee pain.  Pt introduced to Nustep for warm up today with minimal discomfort.  Pt instructed in several standing and seated exercises today to increase strength, function, and ROM, while decrease pain and tone.  Pt requiring min cues for proper technique with all newly added exercises.  Pt with slight increase in right knee pain while performing seated marches.  Pt reported mild decrease in bil knee pain at completion of today's treatment session.   OBJECTIVE IMPAIRMENTS: Abnormal gait, decreased activity tolerance, decreased balance, decreased mobility, difficulty walking, decreased strength, and pain.   ACTIVITY LIMITATIONS: carrying, lifting, standing, squatting, stairs, transfers, and locomotion level  PARTICIPATION LIMITATIONS: meal prep, cleaning, laundry, shopping, community activity, and yard work  PERSONAL FACTORS: Age, Past/current experiences, Time since onset of injury/illness/exacerbation, and 3+ comorbidities: Hypertension, asthma, allergies, arthritis, history of COVID-19, and depression are also affecting patient's functional outcome.   REHAB POTENTIAL: Good  CLINICAL DECISION MAKING: Evolving/moderate complexity  EVALUATION COMPLEXITY: Moderate   GOALS: Goals reviewed with patient? Yes  SHORT TERM GOALS: Target date: 10/30/23 Patient will be independent with her initial HEP. Baseline: Goal status: INITIAL  2.  Patient will improve her 5 times sit to stand time to 16 seconds or less for improved lower extremity power. Baseline:  Goal status: INITIAL  3.  Patient will improve her bilateral hip flexor strength to at least 4/5 for improved functional mobility. Baseline:  Goal  status: INITIAL  LONG TERM GOALS: Target date: 11/20/23  Patient will be independent with her advanced HEP. Baseline:  Goal status: INITIAL  2.  Patient will improve her 5 times sit to stand tab to 12 seconds or less to reduce her fall risk. Baseline:  Goal status: INITIAL  3.  Patient will improve her timed up and go time to 12 seconds or less to reduce her fall risk. Baseline:  Goal status: INITIAL  4.  Patient will improve her LEFS score to at least 35/80 for improved perceived function with her daily activities. Baseline:  Goal status: INITIAL  PLAN:  PT FREQUENCY: 2x/week  PT DURATION: 6 weeks  PLANNED INTERVENTIONS: 16109- PT Re-evaluation, 97750- Physical Performance Testing, 97110-Therapeutic exercises, 97530- Therapeutic activity, 97112- Neuromuscular re-education, 97535- Self Care, 60454- Manual therapy, [phone]- Gait training, [phone]- Electrical stimulation (unattended), 97016- Vasopneumatic device, Patient/Family education, Balance training, Stair training, Joint mobilization, Cryotherapy, and Moist heat  PLAN FOR NEXT SESSION: NuStep, lower extremity strengthening, balance interventions, gait training   Deryl Flora, PTA 10/16/2023, 11:19 AM

## 2023-10-18 ENCOUNTER — Ambulatory Visit

## 2023-10-18 DIAGNOSIS — R2689 Other abnormalities of gait and mobility: Secondary | ICD-10-CM

## 2023-10-18 DIAGNOSIS — Z9181 History of falling: Secondary | ICD-10-CM

## 2023-10-18 NOTE — Therapy (Signed)
 OUTPATIENT PHYSICAL THERAPY LOWER EXTREMITY TREATMENT   Patient Name: Traci Johnson MRN: [id number] DOB:1950-12-03, 73 y.o., female Today's Date: 10/18/2023  END OF SESSION:  PT End of Session - 10/18/23 1059     Visit Number 3    Number of Visits 12    Date for PT Re-Evaluation 12/28/23    PT Start Time 1100    PT Stop Time 1143    PT Time Calculation (min) 43 min    Activity Tolerance Patient tolerated treatment well    Behavior During Therapy WFL for tasks assessed/performed              Past Medical History:  Diagnosis Date   Allergy    Arthritis    Asthma    Chronic fatigue    COVID-19    Depression    Hyperparathyroidism (HCC)    Hypertension    Migraines    cluster   Sleep apnea    Past Surgical History:  Procedure Laterality Date   ABDOMINAL HYSTERECTOMY  1985   CHOLECYSTECTOMY N/A 05/02/2017   Procedure: LAPAROSCOPIC CHOLECYSTECTOMY;  Surgeon: Derral Flick, MD;  Location: [facility];  Service: General;  Laterality: N/A;   LEFT HEART CATH AND CORONARY ANGIOGRAPHY N/A 04/30/2017   Procedure: LEFT HEART CATH AND CORONARY ANGIOGRAPHY;  Surgeon: Avanell Leigh, MD;  Location: [facility];  Service: Cardiovascular;  Laterality: N/A;   LIGAMENT REPAIR Right    birth defect   PARATHYROIDECTOMY Left 03/10/2021   Procedure: LEFT INFERIOR PARATHYROIDECTOMY;  Surgeon: Oralee Billow, MD;  Location: [facility];  Service: General;  Laterality: Left;   REPLACEMENT TOTAL KNEE BILATERAL     TOTAL HIP ARTHROPLASTY Right    Patient Active Problem List   Diagnosis Date Noted   Facet arthropathy, lumbar 07/17/2023   Chronic left sacroiliac joint pain 07/17/2023   Lumbar trigger point syndrome 04/12/2022   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) 02/14/2022   Painful total knee replacement, left (HCC) 02/01/2022   Acquired leg length discrepancy 08/23/2021   Degenerative lumbar spinal stenosis 08/23/2021   Rectocele 07/07/2021   S/P hysterectomy  07/07/2021   Hyperparathyroidism, primary (HCC) 03/06/2021   Encounter for screening fecal occult blood testing 05/13/2020   Pelvic relaxation 05/13/2020   Encounter for well woman exam with routine gynecological exam 03/25/2018   Screening for colorectal cancer 03/25/2018   Fecal occult blood test positive 03/25/2018   Current use of estrogen therapy 03/25/2018   Orthostatic hypotension 05/03/2017   Syncope due to orthostatic hypotension 05/03/2017   Symptomatic cholelithiasis 05/02/2017   Acute cholecystitis 05/02/2017   Abnormal nuclear stress test    Chest pain 04/27/2017   Hyperglycemia 04/27/2017   Anemia 04/27/2017   Essential hypertension 07/15/2014   Mild persistent asthma without complication 07/15/2014   Vitamin D  deficiency 07/15/2014    PCP: Lorre Rosin, NP  REFERRING PROVIDER: Isidro Margo, DO   REFERRING DIAG: Arthritis of knee, Balance problem, Coordination problem   THERAPY DIAG:  Other abnormalities of gait and mobility  History of falling  Rationale for Evaluation and Treatment: Rehabilitation  ONSET DATE: chronic  SUBJECTIVE:   SUBJECTIVE STATEMENT: Patient reports that she is stiff as a board today, but that is everyday. She notes that her last appointment was rough. [place]   PERTINENT HISTORY: Hypertension, asthma, allergies, arthritis, history of COVID-19, and depression PAIN:  Are you having pain? Yes: NPRS scale: 7/10 Pain location:  both knees  Pain description: stiff Aggravating factors: walking (10 minutes without walking sticks and 30 minutes with them)  Relieving factors: rest  PRECAUTIONS: Fall  RED FLAGS: None   WEIGHT BEARING RESTRICTIONS: No  FALLS:  Has patient fallen in last 6 months? Yes. Number of falls 3-4  LIVING ENVIRONMENT: Lives with: lives alone Lives in: House/apartment Stairs: Yes: External: 2 steps; none; step to pattern Has following equipment  at home: None  OCCUPATION: retired  PLOF: Independent  PATIENT GOALS: improved safety and reduced fall risk  NEXT MD VISIT: none scheduled  OBJECTIVE:  Note: Objective measures were completed at Evaluation unless otherwise noted.  PATIENT SURVEYS:  LEFS 18/80  COGNITION: Overall cognitive status: Within functional limits for tasks assessed     SENSATION: Patient reports no numbness or tingling  PALPATION: No tenderness to palpation reported  LOWER EXTREMITY ROM:  Active ROM Right eval Left eval  Hip flexion    Hip extension    Hip abduction    Hip adduction    Hip internal rotation    Hip external rotation    Knee flexion 130 126  Knee extension 6 3  Ankle dorsiflexion    Ankle plantarflexion    Ankle inversion    Ankle eversion     (Blank rows = not tested)  LOWER EXTREMITY MMT:  MMT Right eval Left eval  Hip flexion 4-/5 4-/5  Hip extension    Hip abduction    Hip adduction    Hip internal rotation    Hip external rotation    Knee flexion 4+/5 4+/5  Knee extension 4+/5 4/5  Ankle dorsiflexion 3/5 3/5  Ankle plantarflexion    Ankle inversion    Ankle eversion     (Blank rows = not tested)  FUNCTIONAL TESTS:  5 times sit to stand: 21.59 second without UE support Timed up and go (TUG): 14.97 seconds  GAIT: Assistive device utilized: None Level of assistance: Complete Independence Comments: decreased stride length, gait speed, and reduced knee flexion with swing phase bilaterally                                                                                                                              TREATMENT DATE:                                     10/18/23 EXERCISE LOG  Exercise Repetitions and Resistance Comments  Nustep  L3 x 15 minutes   Marching on foam 2 minutes    Standing gastroc stretch  2 minutes   Standing HS stretch  3 x 30 seconds each    Standing HS curl  2 minutes   Seated hip ADD isometric  2.5 minutes w/ 5 second  hold    LAQ  3# x 25 reps each    Sit to stand  10 reps  Without UE support   Blank cell = exercise not performed today   10/16/23                                  EXERCISE LOG  Exercise Repetitions and Resistance Comments  Nustep Lvl 3 x 15 mins   Rockerboard 3 mins   Lunges 14" box x 2 mins each LE   Side-stepping Airex x 4.5 mins   LAQs 2# x 20 reps bil   Seated Marches 2# x 20 reps bil   Seated Hip Abduction Red x 3 mins   Seated Ham Curls Red x 20 reps bil   STS     Blank cell = exercise not performed today   PATIENT EDUCATION:  Education details: Plan of care, prognosis, objective findings, and goals for physical therapy Person educated: Patient Education method: Explanation Education comprehension: verbalized understanding  HOME EXERCISE PROGRAM:   ASSESSMENT:  CLINICAL IMPRESSION: Patient was introduced to multiple new interventions for improved functional mobility. She required minimal cueing with today's new interventions for proper exercise performance. Today's standing hamstring and gastrocnemius stretching was the most effective at reducing her familiar symptoms. She was educated on how to properly perform these interventions at home. She reported understanding, but declined a handout. She reported feeling slightly better upon the conclusion of treatment. She continues to require skilled physical therapy to address her remaining impairments to return to her prior level of function.    OBJECTIVE IMPAIRMENTS: Abnormal gait, decreased activity tolerance, decreased balance, decreased mobility, difficulty walking, decreased strength, and pain.   ACTIVITY LIMITATIONS: carrying, lifting, standing, squatting, stairs, transfers, and locomotion level  PARTICIPATION LIMITATIONS: meal prep, cleaning, laundry, shopping, community activity, and yard work  PERSONAL FACTORS: Age, Past/current experiences, Time since onset of injury/illness/exacerbation, and 3+ comorbidities:  Hypertension, asthma, allergies, arthritis, history of COVID-19, and depression are also affecting patient's functional outcome.   REHAB POTENTIAL: Good  CLINICAL DECISION MAKING: Evolving/moderate complexity  EVALUATION COMPLEXITY: Moderate   GOALS: Goals reviewed with patient? Yes  SHORT TERM GOALS: Target date: 10/30/23 Patient will be independent with her initial HEP. Baseline: Goal status: INITIAL  2.  Patient will improve her 5 times sit to stand time to 16 seconds or less for improved lower extremity power. Baseline:  Goal status: INITIAL  3.  Patient will improve her bilateral hip flexor strength to at least 4/5 for improved functional mobility. Baseline:  Goal status: INITIAL  LONG TERM GOALS: Target date: 11/20/23  Patient will be independent with her advanced HEP. Baseline:  Goal status: INITIAL  2.  Patient will improve her 5 times sit to stand tab to 12 seconds or less to reduce her fall risk. Baseline:  Goal status: INITIAL  3.  Patient will improve her timed up and go time to 12 seconds or less to reduce her fall risk. Baseline:  Goal status: INITIAL  4.  Patient will improve her LEFS score to at least 35/80 for improved perceived function with her daily activities. Baseline:  Goal status: INITIAL  PLAN:  PT FREQUENCY: 2x/week  PT DURATION: 6 weeks  PLANNED INTERVENTIONS: 97164- PT Re-evaluation, 97750- Physical Performance Testing, 97110-Therapeutic exercises, 97530- Therapeutic activity, [id number]- Neuromuscular re-education, 97535- Self Care, 16109- Manual therapy, [id number]- Gait training, [phone]- Electrical stimulation (unattended), 97016- Vasopneumatic device, Patient/Family education, Balance training, Stair training, Joint mobilization, Cryotherapy, and Moist heat  PLAN FOR NEXT SESSION: NuStep, lower extremity strengthening, balance interventions,  gait training   Lane Pinon, PT 10/18/2023, 12:28 PM

## 2023-10-23 ENCOUNTER — Ambulatory Visit: Admitting: Podiatry

## 2023-10-23 ENCOUNTER — Ambulatory Visit

## 2023-10-23 DIAGNOSIS — R2689 Other abnormalities of gait and mobility: Secondary | ICD-10-CM | POA: Diagnosis not present

## 2023-10-23 DIAGNOSIS — Z9181 History of falling: Secondary | ICD-10-CM

## 2023-10-23 NOTE — Therapy (Signed)
 OUTPATIENT PHYSICAL THERAPY LOWER EXTREMITY TREATMENT   Patient Name: Traci Johnson MRN: [id number] DOB:1950/12/06, 73 y.o., female Today's Date: 10/23/2023  END OF SESSION:  PT End of Session - 10/23/23 1020     Visit Number 4    Number of Visits 12    Date for PT Re-Evaluation 12/28/23    PT Start Time 1015    PT Stop Time 1100    PT Time Calculation (min) 45 min    Activity Tolerance Patient tolerated treatment well    Behavior During Therapy WFL for tasks assessed/performed               Past Medical History:  Diagnosis Date   Allergy    Arthritis    Asthma    Chronic fatigue    COVID-19    Depression    Hyperparathyroidism (HCC)    Hypertension    Migraines    cluster   Sleep apnea    Past Surgical History:  Procedure Laterality Date   ABDOMINAL HYSTERECTOMY  1985   CHOLECYSTECTOMY N/A 05/02/2017   Procedure: LAPAROSCOPIC CHOLECYSTECTOMY;  Surgeon: Derral Flick, MD;  Location: [facility];  Service: General;  Laterality: N/A;   LEFT HEART CATH AND CORONARY ANGIOGRAPHY N/A 04/30/2017   Procedure: LEFT HEART CATH AND CORONARY ANGIOGRAPHY;  Surgeon: Avanell Leigh, MD;  Location: [facility];  Service: Cardiovascular;  Laterality: N/A;   LIGAMENT REPAIR Right    birth defect   PARATHYROIDECTOMY Left 03/10/2021   Procedure: LEFT INFERIOR PARATHYROIDECTOMY;  Surgeon: Oralee Billow, MD;  Location: [facility];  Service: General;  Laterality: Left;   REPLACEMENT TOTAL KNEE BILATERAL     TOTAL HIP ARTHROPLASTY Right    Patient Active Problem List   Diagnosis Date Noted   Facet arthropathy, lumbar 07/17/2023   Chronic left sacroiliac joint pain 07/17/2023   Lumbar trigger point syndrome 04/12/2022   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) 02/14/2022   Painful total knee replacement, left (HCC) 02/01/2022   Acquired leg length discrepancy 08/23/2021   Degenerative lumbar spinal stenosis 08/23/2021   Rectocele 07/07/2021   S/P hysterectomy  07/07/2021   Hyperparathyroidism, primary (HCC) 03/06/2021   Encounter for screening fecal occult blood testing 05/13/2020   Pelvic relaxation 05/13/2020   Encounter for well woman exam with routine gynecological exam 03/25/2018   Screening for colorectal cancer 03/25/2018   Fecal occult blood test positive 03/25/2018   Current use of estrogen therapy 03/25/2018   Orthostatic hypotension 05/03/2017   Syncope due to orthostatic hypotension 05/03/2017   Symptomatic cholelithiasis 05/02/2017   Acute cholecystitis 05/02/2017   Abnormal nuclear stress test    Chest pain 04/27/2017   Hyperglycemia 04/27/2017   Anemia 04/27/2017   Essential hypertension 07/15/2014   Mild persistent asthma without complication 07/15/2014   Vitamin D  deficiency 07/15/2014    PCP: Lorre Rosin, NP  REFERRING PROVIDER: Isidro Margo, DO   REFERRING DIAG: Arthritis of knee, Balance problem, Coordination problem   THERAPY DIAG:  Other abnormalities of gait and mobility  History of falling  Rationale for Evaluation and Treatment: Rehabilitation  ONSET DATE: chronic  SUBJECTIVE:   SUBJECTIVE STATEMENT: Patient reports that today is a good day.   PERTINENT HISTORY: Hypertension, asthma, allergies, arthritis, history of COVID-19, and depression PAIN:  Are you having pain? Yes: NPRS scale: no pain reported Pain location: both knees  Pain description: stiff Aggravating factors: walking (10 minutes without walking sticks and 30 minutes with them)  Relieving factors: rest  PRECAUTIONS: Fall  RED FLAGS: None   WEIGHT BEARING RESTRICTIONS: No  FALLS:  Has patient fallen in last 6 months? Yes. Number of falls 3-4  LIVING ENVIRONMENT: Lives with: lives alone Lives in: House/apartment Stairs: Yes: External: 2 steps; none; step to pattern Has following equipment at home: None  OCCUPATION: retired  PLOF: Independent  PATIENT GOALS: improved safety and reduced fall risk  NEXT MD  VISIT: none scheduled  OBJECTIVE:  Note: Objective measures were completed at Evaluation unless otherwise noted.  PATIENT SURVEYS:  LEFS 18/80  COGNITION: Overall cognitive status: Within functional limits for tasks assessed     SENSATION: Patient reports no numbness or tingling  PALPATION: No tenderness to palpation reported  LOWER EXTREMITY ROM:  Active ROM Right eval Left eval  Hip flexion    Hip extension    Hip abduction    Hip adduction    Hip internal rotation    Hip external rotation    Knee flexion 130 126  Knee extension 6 3  Ankle dorsiflexion    Ankle plantarflexion    Ankle inversion    Ankle eversion     (Blank rows = not tested)  LOWER EXTREMITY MMT:  MMT Right eval Left eval  Hip flexion 4-/5 4-/5  Hip extension    Hip abduction    Hip adduction    Hip internal rotation    Hip external rotation    Knee flexion 4+/5 4+/5  Knee extension 4+/5 4/5  Ankle dorsiflexion 3/5 3/5  Ankle plantarflexion    Ankle inversion    Ankle eversion     (Blank rows = not tested)  FUNCTIONAL TESTS:  5 times sit to stand: 21.59 second without UE support Timed up and go (TUG): 14.97 seconds  GAIT: Assistive device utilized: None Level of assistance: Complete Independence Comments: decreased stride length, gait speed, and reduced knee flexion with swing phase bilaterally                                                                                                                              TREATMENT DATE:                                     10/23/23 EXERCISE LOG  Exercise Repetitions and Resistance Comments  Nustep  L4 x 15.5 minutes    Rocker board  3 minutes   Marching on foam  2.5 minutes    Static stance on foam  3 minutes  Intermittent UE support from parallel bars   LAQ 5# x 3 minutes  Alternating LE   Seated hip ADD isometric  3 minutes w/ 5 second hold   Standing HS curl  5# x 2 minutes  Alternating LE  Side stepping on foam  6 laps BUE  support from parallel bars   Blank cell = exercise not performed today  10/18/23 EXERCISE LOG  Exercise Repetitions and Resistance Comments  Nustep  L3 x 15 minutes   Marching on foam 2 minutes    Standing gastroc stretch  2 minutes   Standing HS stretch  3 x 30 seconds each    Standing HS curl  2 minutes   Seated hip ADD isometric  2.5 minutes w/ 5 second hold    LAQ  3# x 25 reps each    Sit to stand  10 reps  Without UE support   Blank cell = exercise not performed today   10/16/23                                  EXERCISE LOG  Exercise Repetitions and Resistance Comments  Nustep Lvl 3 x 15 mins   Rockerboard 3 mins   Lunges 14" box x 2 mins each LE   Side-stepping Airex x 4.5 mins   LAQs 2# x 20 reps bil   Seated Marches 2# x 20 reps bil   Seated Hip Abduction Red x 3 mins   Seated Ham Curls Red x 20 reps bil   STS     Blank cell = exercise not performed today   PATIENT EDUCATION:  Education details:  Person educated: Patient Education method: Explanation Education comprehension: verbalized understanding  HOME EXERCISE PROGRAM:   ASSESSMENT:  CLINICAL IMPRESSION: Patient was progressed with static stance on foam and other interventions for improved lower extremity stability on unstable terrain. She required minimal cueing with today's new interventions for proper pacing to avoid compensatory movement patterns. She experienced no significant increase in pain or discomfort with any of today's interventions. She reported feeling "a little tired" upon the conclusion of treatment. She continues to require skilled physical therapy to address her remaining impairments to return to her prior level of function.   OBJECTIVE IMPAIRMENTS: Abnormal gait, decreased activity tolerance, decreased balance, decreased mobility, difficulty walking, decreased strength, and pain.   ACTIVITY LIMITATIONS: carrying, lifting, standing, squatting, stairs,  transfers, and locomotion level  PARTICIPATION LIMITATIONS: meal prep, cleaning, laundry, shopping, community activity, and yard work  PERSONAL FACTORS: Age, Past/current experiences, Time since onset of injury/illness/exacerbation, and 3+ comorbidities: Hypertension, asthma, allergies, arthritis, history of COVID-19, and depression are also affecting patient's functional outcome.   REHAB POTENTIAL: Good  CLINICAL DECISION MAKING: Evolving/moderate complexity  EVALUATION COMPLEXITY: Moderate   GOALS: Goals reviewed with patient? Yes  SHORT TERM GOALS: Target date: 10/30/23 Patient will be independent with her initial HEP. Baseline: Goal status: INITIAL  2.  Patient will improve her 5 times sit to stand time to 16 seconds or less for improved lower extremity power. Baseline:  Goal status: INITIAL  3.  Patient will improve her bilateral hip flexor strength to at least 4/5 for improved functional mobility. Baseline:  Goal status: INITIAL  LONG TERM GOALS: Target date: 11/20/23  Patient will be independent with her advanced HEP. Baseline:  Goal status: INITIAL  2.  Patient will improve her 5 times sit to stand tab to 12 seconds or less to reduce her fall risk. Baseline:  Goal status: INITIAL  3.  Patient will improve her timed up and go time to 12 seconds or less to reduce her fall risk. Baseline:  Goal status: INITIAL  4.  Patient will improve her LEFS score to at least 35/80 for improved perceived function with her daily activities. Baseline:  Goal status: INITIAL  PLAN:  PT FREQUENCY: 2x/week  PT DURATION: 6 weeks  PLANNED INTERVENTIONS: 16109- PT Re-evaluation, 97750- Physical Performance Testing, 97110-Therapeutic exercises, 97530- Therapeutic activity, 97112- Neuromuscular re-education, 97535- Self Care, 60454- Manual therapy, [phone]- Gait training, [phone]- Electrical stimulation (unattended), 97016- Vasopneumatic device, Patient/Family education, Balance training,  Stair training, Joint mobilization, Cryotherapy, and Moist heat  PLAN FOR NEXT SESSION: NuStep, lower extremity strengthening, balance interventions, gait training   Lane Pinon, PT 10/23/2023, 12:34 PM

## 2023-10-25 ENCOUNTER — Ambulatory Visit

## 2023-10-25 DIAGNOSIS — Z9181 History of falling: Secondary | ICD-10-CM

## 2023-10-25 DIAGNOSIS — R2689 Other abnormalities of gait and mobility: Secondary | ICD-10-CM

## 2023-10-25 NOTE — Therapy (Signed)
 OUTPATIENT PHYSICAL THERAPY LOWER EXTREMITY TREATMENT   Patient Name: Traci Johnson MRN: [id number] DOB:Sep 01, 1950, 73 y.o., female Today's Date: 10/25/2023  END OF SESSION:  PT End of Session - 10/25/23 1020     Visit Number 5    Number of Visits 12    Date for PT Re-Evaluation 12/28/23    PT Start Time 1015    PT Stop Time 1100    PT Time Calculation (min) 45 min    Activity Tolerance Patient tolerated treatment well    Behavior During Therapy WFL for tasks assessed/performed                Past Medical History:  Diagnosis Date   Allergy    Arthritis    Asthma    Chronic fatigue    COVID-19    Depression    Hyperparathyroidism (HCC)    Hypertension    Migraines    cluster   Sleep apnea    Past Surgical History:  Procedure Laterality Date   ABDOMINAL HYSTERECTOMY  1985   CHOLECYSTECTOMY N/A 05/02/2017   Procedure: LAPAROSCOPIC CHOLECYSTECTOMY;  Surgeon: Derral Flick, MD;  Location: [facility];  Service: General;  Laterality: N/A;   LEFT HEART CATH AND CORONARY ANGIOGRAPHY N/A 04/30/2017   Procedure: LEFT HEART CATH AND CORONARY ANGIOGRAPHY;  Surgeon: Avanell Leigh, MD;  Location: [facility];  Service: Cardiovascular;  Laterality: N/A;   LIGAMENT REPAIR Right    birth defect   PARATHYROIDECTOMY Left 03/10/2021   Procedure: LEFT INFERIOR PARATHYROIDECTOMY;  Surgeon: Oralee Billow, MD;  Location: [facility];  Service: General;  Laterality: Left;   REPLACEMENT TOTAL KNEE BILATERAL     TOTAL HIP ARTHROPLASTY Right    Patient Active Problem List   Diagnosis Date Noted   Facet arthropathy, lumbar 07/17/2023   Chronic left sacroiliac joint pain 07/17/2023   Lumbar trigger point syndrome 04/12/2022   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) 02/14/2022   Painful total knee replacement, left (HCC) 02/01/2022   Acquired leg length discrepancy 08/23/2021   Degenerative lumbar spinal stenosis 08/23/2021   Rectocele 07/07/2021   S/P  hysterectomy 07/07/2021   Hyperparathyroidism, primary (HCC) 03/06/2021   Encounter for screening fecal occult blood testing 05/13/2020   Pelvic relaxation 05/13/2020   Encounter for well woman exam with routine gynecological exam 03/25/2018   Screening for colorectal cancer 03/25/2018   Fecal occult blood test positive 03/25/2018   Current use of estrogen therapy 03/25/2018   Orthostatic hypotension 05/03/2017   Syncope due to orthostatic hypotension 05/03/2017   Symptomatic cholelithiasis 05/02/2017   Acute cholecystitis 05/02/2017   Abnormal nuclear stress test    Chest pain 04/27/2017   Hyperglycemia 04/27/2017   Anemia 04/27/2017   Essential hypertension 07/15/2014   Mild persistent asthma without complication 07/15/2014   Vitamin D  deficiency 07/15/2014    PCP: Lorre Rosin, NP  REFERRING PROVIDER: Isidro Margo, DO   REFERRING DIAG: Arthritis of knee, Balance problem, Coordination problem   THERAPY DIAG:  Other abnormalities of gait and mobility  History of falling  Rationale for Evaluation and Treatment: Rehabilitation  ONSET DATE: chronic  SUBJECTIVE:   SUBJECTIVE STATEMENT: Patient reports that she feels better than she did last week, but not as good as she felt Tuesday.   PERTINENT HISTORY: Hypertension, asthma, allergies, arthritis, history of COVID-19, and depression PAIN:  Are you having pain? Yes: NPRS scale: no pain reported Pain location: both knees  Pain description: stiff Aggravating factors: walking (10 minutes  without walking sticks and 30 minutes with them)  Relieving factors: rest  PRECAUTIONS: Fall  RED FLAGS: None   WEIGHT BEARING RESTRICTIONS: No  FALLS:  Has patient fallen in last 6 months? Yes. Number of falls 3-4  LIVING ENVIRONMENT: Lives with: lives alone Lives in: House/apartment Stairs: Yes: External: 2 steps; none; step to pattern Has following equipment at home: None  OCCUPATION: retired  PLOF:  Independent  PATIENT GOALS: improved safety and reduced fall risk  NEXT MD VISIT: none scheduled  OBJECTIVE:  Note: Objective measures were completed at Evaluation unless otherwise noted.  PATIENT SURVEYS:  LEFS 18/80  COGNITION: Overall cognitive status: Within functional limits for tasks assessed     SENSATION: Patient reports no numbness or tingling  PALPATION: No tenderness to palpation reported  LOWER EXTREMITY ROM:  Active ROM Right eval Left eval  Hip flexion    Hip extension    Hip abduction    Hip adduction    Hip internal rotation    Hip external rotation    Knee flexion 130 126  Knee extension 6 3  Ankle dorsiflexion    Ankle plantarflexion    Ankle inversion    Ankle eversion     (Blank rows = not tested)  LOWER EXTREMITY MMT:  MMT Right eval Left eval  Hip flexion 4-/5 4-/5  Hip extension    Hip abduction    Hip adduction    Hip internal rotation    Hip external rotation    Knee flexion 4+/5 4+/5  Knee extension 4+/5 4/5  Ankle dorsiflexion 3/5 3/5  Ankle plantarflexion    Ankle inversion    Ankle eversion     (Blank rows = not tested)  FUNCTIONAL TESTS:  5 times sit to stand: 21.59 second without UE support Timed up and go (TUG): 14.97 seconds  GAIT: Assistive device utilized: None Level of assistance: Complete Independence Comments: decreased stride length, gait speed, and reduced knee flexion with swing phase bilaterally                                                                                                                              TREATMENT DATE:                                     10/25/23 EXERCISE LOG  Exercise Repetitions and Resistance Comments  Nustep  L5 x 17 minutes   BOSU controls  2.5 minutes each  AP and lateral   Standing gastroc stretch  2.5 minutes    Side stepping on foam  2.5 minutes   LAQ 5# x 3 minutes  Alternating LE  Seated clams  Green t-band x 3 minutes    Sit to stand  15 reps  Without UE  support   Blank cell = exercise not performed today  10/23/23 EXERCISE LOG  Exercise Repetitions and Resistance Comments  Nustep  L4 x 15.5 minutes    Rocker board  3 minutes   Marching on foam  2.5 minutes    Static stance on foam  3 minutes  Intermittent UE support from parallel bars   LAQ 5# x 3 minutes  Alternating LE   Seated hip ADD isometric  3 minutes w/ 5 second hold   Standing HS curl  5# x 2 minutes  Alternating LE  Side stepping on foam  6 laps BUE support from parallel bars   Blank cell = exercise not performed today                                    10/18/23 EXERCISE LOG  Exercise Repetitions and Resistance Comments  Nustep  L3 x 15 minutes   Marching on foam 2 minutes    Standing gastroc stretch  2 minutes   Standing HS stretch  3 x 30 seconds each    Standing HS curl  2 minutes   Seated hip ADD isometric  2.5 minutes w/ 5 second hold    LAQ  3# x 25 reps each    Sit to stand  10 reps  Without UE support   Blank cell = exercise not performed today   PATIENT EDUCATION:  Education details:  Person educated: Patient Education method: Explanation Education comprehension: verbalized understanding  HOME EXERCISE PROGRAM:   ASSESSMENT:  CLINICAL IMPRESSION: Patient was progressed with multiple new and familiar interventions for improved dynamic stability. She required minimal cueing with BOSU controls for proper exercise performance to facilitate increase lower extremity demand. She experienced no increase in pain or discomfort with any of today's interventions. She reported feeling tired upon the conclusion of treatment. She continues to require skilled physical therapy to address her remaining impairments to maximize her safety and functional mobility.   OBJECTIVE IMPAIRMENTS: Abnormal gait, decreased activity tolerance, decreased balance, decreased mobility, difficulty walking, decreased strength, and pain.   ACTIVITY  LIMITATIONS: carrying, lifting, standing, squatting, stairs, transfers, and locomotion level  PARTICIPATION LIMITATIONS: meal prep, cleaning, laundry, shopping, community activity, and yard work  PERSONAL FACTORS: Age, Past/current experiences, Time since onset of injury/illness/exacerbation, and 3+ comorbidities: Hypertension, asthma, allergies, arthritis, history of COVID-19, and depression are also affecting patient's functional outcome.   REHAB POTENTIAL: Good  CLINICAL DECISION MAKING: Evolving/moderate complexity  EVALUATION COMPLEXITY: Moderate   GOALS: Goals reviewed with patient? Yes  SHORT TERM GOALS: Target date: 10/30/23 Patient will be independent with her initial HEP. Baseline: Goal status: INITIAL  2.  Patient will improve her 5 times sit to stand time to 16 seconds or less for improved lower extremity power. Baseline:  Goal status: INITIAL  3.  Patient will improve her bilateral hip flexor strength to at least 4/5 for improved functional mobility. Baseline:  Goal status: INITIAL  LONG TERM GOALS: Target date: 11/20/23  Patient will be independent with her advanced HEP. Baseline:  Goal status: INITIAL  2.  Patient will improve her 5 times sit to stand tab to 12 seconds or less to reduce her fall risk. Baseline:  Goal status: INITIAL  3.  Patient will improve her timed up and go time to 12 seconds or less to reduce her fall risk. Baseline:  Goal status: INITIAL  4.  Patient will improve her LEFS score to at least 35/80 for improved perceived function  with her daily activities. Baseline:  Goal status: INITIAL  PLAN:  PT FREQUENCY: 2x/week  PT DURATION: 6 weeks  PLANNED INTERVENTIONS: 84696- PT Re-evaluation, 97750- Physical Performance Testing, 97110-Therapeutic exercises, 97530- Therapeutic activity, [id number]- Neuromuscular re-education, 97535- Self Care, 29528- Manual therapy, [phone]- Gait training, [phone]- Electrical stimulation (unattended), 97016-  Vasopneumatic device, Patient/Family education, Balance training, Stair training, Joint mobilization, Cryotherapy, and Moist heat  PLAN FOR NEXT SESSION: NuStep, lower extremity strengthening, balance interventions, gait training   Lane Pinon, PT 10/25/2023, 1:07 PM

## 2023-10-30 ENCOUNTER — Ambulatory Visit

## 2023-10-30 DIAGNOSIS — R2689 Other abnormalities of gait and mobility: Secondary | ICD-10-CM

## 2023-10-30 DIAGNOSIS — Z9181 History of falling: Secondary | ICD-10-CM

## 2023-10-30 NOTE — Therapy (Signed)
 OUTPATIENT PHYSICAL THERAPY LOWER EXTREMITY TREATMENT   Patient Name: Traci Johnson MRN: [id number] DOB:04-Mar-1951, 73 y.o., female Today's Date: 10/30/2023  END OF SESSION:  PT End of Session - 10/30/23 1019     Visit Number 6    Number of Visits 12    Date for PT Re-Evaluation 12/28/23    PT Start Time 1015    PT Stop Time 1104    PT Time Calculation (min) 49 min    Activity Tolerance Patient tolerated treatment well    Behavior During Therapy WFL for tasks assessed/performed                Past Medical History:  Diagnosis Date   Allergy    Arthritis    Asthma    Chronic fatigue    COVID-19    Depression    Hyperparathyroidism (HCC)    Hypertension    Migraines    cluster   Sleep apnea    Past Surgical History:  Procedure Laterality Date   ABDOMINAL HYSTERECTOMY  1985   CHOLECYSTECTOMY N/A 05/02/2017   Procedure: LAPAROSCOPIC CHOLECYSTECTOMY;  Surgeon: Derral Flick, MD;  Location: [facility];  Service: General;  Laterality: N/A;   LEFT HEART CATH AND CORONARY ANGIOGRAPHY N/A 04/30/2017   Procedure: LEFT HEART CATH AND CORONARY ANGIOGRAPHY;  Surgeon: Avanell Leigh, MD;  Location: [facility];  Service: Cardiovascular;  Laterality: N/A;   LIGAMENT REPAIR Right    birth defect   PARATHYROIDECTOMY Left 03/10/2021   Procedure: LEFT INFERIOR PARATHYROIDECTOMY;  Surgeon: Oralee Billow, MD;  Location: [facility];  Service: General;  Laterality: Left;   REPLACEMENT TOTAL KNEE BILATERAL     TOTAL HIP ARTHROPLASTY Right    Patient Active Problem List   Diagnosis Date Noted   Facet arthropathy, lumbar 07/17/2023   Chronic left sacroiliac joint pain 07/17/2023   Lumbar trigger point syndrome 04/12/2022   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) 02/14/2022   Painful total knee replacement, left (HCC) 02/01/2022   Acquired leg length discrepancy 08/23/2021   Degenerative lumbar spinal stenosis 08/23/2021   Rectocele 07/07/2021   S/P  hysterectomy 07/07/2021   Hyperparathyroidism, primary (HCC) 03/06/2021   Encounter for screening fecal occult blood testing 05/13/2020   Pelvic relaxation 05/13/2020   Encounter for well woman exam with routine gynecological exam 03/25/2018   Screening for colorectal cancer 03/25/2018   Fecal occult blood test positive 03/25/2018   Current use of estrogen therapy 03/25/2018   Orthostatic hypotension 05/03/2017   Syncope due to orthostatic hypotension 05/03/2017   Symptomatic cholelithiasis 05/02/2017   Acute cholecystitis 05/02/2017   Abnormal nuclear stress test    Chest pain 04/27/2017   Hyperglycemia 04/27/2017   Anemia 04/27/2017   Essential hypertension 07/15/2014   Mild persistent asthma without complication 07/15/2014   Vitamin D  deficiency 07/15/2014    PCP: Lorre Rosin, NP  REFERRING PROVIDER: Isidro Margo, DO   REFERRING DIAG: Arthritis of knee, Balance problem, Coordination problem   THERAPY DIAG:  Other abnormalities of gait and mobility  History of falling  Rationale for Evaluation and Treatment: Rehabilitation  ONSET DATE: chronic  SUBJECTIVE:   SUBJECTIVE STATEMENT: Pt reports left knee stiffness today, but no pain.   PERTINENT HISTORY: Hypertension, asthma, allergies, arthritis, history of COVID-19, and depression PAIN:  Are you having pain? No  PRECAUTIONS: Fall  RED FLAGS: None   WEIGHT BEARING RESTRICTIONS: No  FALLS:  Has patient fallen in last 6 months? Yes. Number of falls 3-4  LIVING ENVIRONMENT: Lives with: lives alone Lives in: House/apartment Stairs: Yes: External: 2 steps; none; step to pattern Has following equipment at home: None  OCCUPATION: retired  PLOF: Independent  PATIENT GOALS: improved safety and reduced fall risk  NEXT MD VISIT: none scheduled  OBJECTIVE:  Note: Objective measures were completed at Evaluation unless otherwise noted.  PATIENT SURVEYS:  LEFS 18/80  COGNITION: Overall cognitive  status: Within functional limits for tasks assessed     SENSATION: Patient reports no numbness or tingling  PALPATION: No tenderness to palpation reported  LOWER EXTREMITY ROM:  Active ROM Right eval Left eval  Hip flexion    Hip extension    Hip abduction    Hip adduction    Hip internal rotation    Hip external rotation    Knee flexion 130 126  Knee extension 6 3  Ankle dorsiflexion    Ankle plantarflexion    Ankle inversion    Ankle eversion     (Blank rows = not tested)  LOWER EXTREMITY MMT:  MMT Right eval Left eval  Hip flexion 4-/5 4-/5  Hip extension    Hip abduction    Hip adduction    Hip internal rotation    Hip external rotation    Knee flexion 4+/5 4+/5  Knee extension 4+/5 4/5  Ankle dorsiflexion 3/5 3/5  Ankle plantarflexion    Ankle inversion    Ankle eversion     (Blank rows = not tested)  FUNCTIONAL TESTS:  5 times sit to stand: 21.59 second without UE support Timed up and go (TUG): 14.97 seconds  GAIT: Assistive device utilized: None Level of assistance: Complete Independence Comments: decreased stride length, gait speed, and reduced knee flexion with swing phase bilaterally                                                                                                                              TREATMENT DATE:    10/25/23     EXERCISE LOG  Exercise Repetitions and Resistance Comments  Nustep  L4 x 17 minutes   5 STS 14.78 seconds   TUG 11.74 seconds   Rockerboard 3 mins   BOSU controls  3 mins AP and lateral   Standing gastroc stretch  3 mins   Tandem Gait 3 mins   Side stepping on foam  3 minutes   LAQ  Alternating LE  Seated clams     Sit to stand   Without UE support   Blank cell = exercise not performed today                                    10/23/23 EXERCISE LOG  Exercise Repetitions and Resistance Comments  Nustep  L4 x 15.5 minutes    Rocker board  3 minutes   Marching on foam  2.5 minutes    Static stance  on  foam  3 minutes  Intermittent UE support from parallel bars   LAQ 5# x 3 minutes  Alternating LE   Seated hip ADD isometric  3 minutes w/ 5 second hold   Standing HS curl  5# x 2 minutes  Alternating LE  Side stepping on foam  6 laps BUE support from parallel bars   Blank cell = exercise not performed today                                    10/18/23 EXERCISE LOG  Exercise Repetitions and Resistance Comments  Nustep  L3 x 15 minutes   Marching on foam 2 minutes    Standing gastroc stretch  2 minutes   Standing HS stretch  3 x 30 seconds each    Standing HS curl  2 minutes   Seated hip ADD isometric  2.5 minutes w/ 5 second hold    LAQ  3# x 25 reps each    Sit to stand  10 reps  Without UE support   Blank cell = exercise not performed today   PATIENT EDUCATION:  Education details:  Person educated: Patient Education method: Explanation Education comprehension: verbalized understanding  HOME EXERCISE PROGRAM:   ASSESSMENT:  CLINICAL IMPRESSION: Pt arrives for today's treatment session denying any pain, but does reports left knee stiffness today.  Pt able to perform 5 STS in 14.78 seconds, meeting her STG and making good progress towards her LTG.  Pt able to demonstrate 4+/5 RLE strength and 4/5 LLE strength meeting her strength goal.  Pt also able to perform TUG in 11.74 seconds meeting her LTG.   Pt also able to increase LEFs score to 23/80, making good progress towards her LTG.  Today's treatment session concentrating on balance activities due to pt's continued concern with and fear of falling.  Pt requiring gentle BUE support with all balance exercises.  Pt denied any pain at completion of today's treatment session.  OBJECTIVE IMPAIRMENTS: Abnormal gait, decreased activity tolerance, decreased balance, decreased mobility, difficulty walking, decreased strength, and pain.   ACTIVITY LIMITATIONS: carrying, lifting, standing, squatting, stairs, transfers, and locomotion  level  PARTICIPATION LIMITATIONS: meal prep, cleaning, laundry, shopping, community activity, and yard work  PERSONAL FACTORS: Age, Past/current experiences, Time since onset of injury/illness/exacerbation, and 3+ comorbidities: Hypertension, asthma, allergies, arthritis, history of COVID-19, and depression are also affecting patient's functional outcome.   REHAB POTENTIAL: Good  CLINICAL DECISION MAKING: Evolving/moderate complexity  EVALUATION COMPLEXITY: Moderate   GOALS: Goals reviewed with patient? Yes  SHORT TERM GOALS: Target date: 10/30/23 Patient will be independent with her initial HEP. Baseline: Goal status: MET  2.  Patient will improve her 5 times sit to stand time to 16 seconds or less for improved lower extremity power. Baseline: 5/27: 14.78 seconds Goal status: MET  3.  Patient will improve her bilateral hip flexor strength to at least 4/5 for improved functional mobility. Baseline: 5/27: 4+/5 RLE 4/5 LLE Goal status: MET  LONG TERM GOALS: Target date: 11/20/23  Patient will be independent with her advanced HEP. Baseline:  Goal status: IN PROGRESS  2.  Patient will improve her 5 times sit to stand tab to 12 seconds or less to reduce her fall risk. Baseline: 5/27: 14.78 seconds  Goal status: IN PROGRESS  3.  Patient will improve her timed up and go time to 12 seconds or less to reduce her  fall risk. Baseline: 5/27: 11.74 seconds Goal status: MET  4.  Patient will improve her LEFS score to at least 35/80 for improved perceived function with her daily activities. Baseline: 5/27: 23/80 Goal status: IN PROGRESS  PLAN:  PT FREQUENCY: 2x/week  PT DURATION: 6 weeks  PLANNED INTERVENTIONS: 97164- PT Re-evaluation, 97750- Physical Performance Testing, 97110-Therapeutic exercises, 97530- Therapeutic activity, 97112- Neuromuscular re-education, 97535- Self Care, 40981- Manual therapy, [phone]- Gait training, [phone]- Electrical stimulation (unattended), 97016-  Vasopneumatic device, Patient/Family education, Balance training, Stair training, Joint mobilization, Cryotherapy, and Moist heat  PLAN FOR NEXT SESSION: NuStep, lower extremity strengthening, balance interventions, gait training   Deryl Flora, PTA 10/30/2023, 11:18 AM

## 2023-11-02 ENCOUNTER — Ambulatory Visit

## 2023-11-02 DIAGNOSIS — R2689 Other abnormalities of gait and mobility: Secondary | ICD-10-CM

## 2023-11-02 DIAGNOSIS — Z9181 History of falling: Secondary | ICD-10-CM

## 2023-11-02 NOTE — Therapy (Signed)
 OUTPATIENT PHYSICAL THERAPY LOWER EXTREMITY TREATMENT   Patient Name: Traci Johnson MRN: [id number] DOB:January 10, 1951, 73 y.o., female Today's Date: 11/02/2023  END OF SESSION:  PT End of Session - 11/02/23 1142     Visit Number 7    Number of Visits 12    Date for PT Re-Evaluation 12/28/23    PT Start Time 1145    PT Stop Time 1227    PT Time Calculation (min) 42 min    Activity Tolerance Patient tolerated treatment well    Behavior During Therapy WFL for tasks assessed/performed                Past Medical History:  Diagnosis Date   Allergy    Arthritis    Asthma    Chronic fatigue    COVID-19    Depression    Hyperparathyroidism (HCC)    Hypertension    Migraines    cluster   Sleep apnea    Past Surgical History:  Procedure Laterality Date   ABDOMINAL HYSTERECTOMY  1985   CHOLECYSTECTOMY N/A 05/02/2017   Procedure: LAPAROSCOPIC CHOLECYSTECTOMY;  Surgeon: Derral Flick, MD;  Location: [facility];  Service: General;  Laterality: N/A;   LEFT HEART CATH AND CORONARY ANGIOGRAPHY N/A 04/30/2017   Procedure: LEFT HEART CATH AND CORONARY ANGIOGRAPHY;  Surgeon: Avanell Leigh, MD;  Location: [facility];  Service: Cardiovascular;  Laterality: N/A;   LIGAMENT REPAIR Right    birth defect   PARATHYROIDECTOMY Left 03/10/2021   Procedure: LEFT INFERIOR PARATHYROIDECTOMY;  Surgeon: Oralee Billow, MD;  Location: [facility];  Service: General;  Laterality: Left;   REPLACEMENT TOTAL KNEE BILATERAL     TOTAL HIP ARTHROPLASTY Right    Patient Active Problem List   Diagnosis Date Noted   Facet arthropathy, lumbar 07/17/2023   Chronic left sacroiliac joint pain 07/17/2023   Lumbar trigger point syndrome 04/12/2022   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) 02/14/2022   Painful total knee replacement, left (HCC) 02/01/2022   Acquired leg length discrepancy 08/23/2021   Degenerative lumbar spinal stenosis 08/23/2021   Rectocele 07/07/2021   S/P  hysterectomy 07/07/2021   Hyperparathyroidism, primary (HCC) 03/06/2021   Encounter for screening fecal occult blood testing 05/13/2020   Pelvic relaxation 05/13/2020   Encounter for well woman exam with routine gynecological exam 03/25/2018   Screening for colorectal cancer 03/25/2018   Fecal occult blood test positive 03/25/2018   Current use of estrogen therapy 03/25/2018   Orthostatic hypotension 05/03/2017   Syncope due to orthostatic hypotension 05/03/2017   Symptomatic cholelithiasis 05/02/2017   Acute cholecystitis 05/02/2017   Abnormal nuclear stress test    Chest pain 04/27/2017   Hyperglycemia 04/27/2017   Anemia 04/27/2017   Essential hypertension 07/15/2014   Mild persistent asthma without complication 07/15/2014   Vitamin D  deficiency 07/15/2014    PCP: Lorre Rosin, NP  REFERRING PROVIDER: Isidro Margo, DO   REFERRING DIAG: Arthritis of knee, Balance problem, Coordination problem   THERAPY DIAG:  Other abnormalities of gait and mobility  History of falling  Rationale for Evaluation and Treatment: Rehabilitation  ONSET DATE: chronic  SUBJECTIVE:   SUBJECTIVE STATEMENT: Patient reports that she does not feel well today. She woke up feeling nauseous.   PERTINENT HISTORY: Hypertension, asthma, allergies, arthritis, history of COVID-19, and depression PAIN:  Are you having pain? No  PRECAUTIONS: Fall  RED FLAGS: None   WEIGHT BEARING RESTRICTIONS: No  FALLS:  Has patient fallen in last 6 months?  Yes. Number of falls 3-4  LIVING ENVIRONMENT: Lives with: lives alone Lives in: House/apartment Stairs: Yes: External: 2 steps; none; step to pattern Has following equipment at home: None  OCCUPATION: retired  PLOF: Independent  PATIENT GOALS: improved safety and reduced fall risk  NEXT MD VISIT: none scheduled  OBJECTIVE:  Note: Objective measures were completed at Evaluation unless otherwise noted.  PATIENT SURVEYS:  LEFS  18/80  COGNITION: Overall cognitive status: Within functional limits for tasks assessed     SENSATION: Patient reports no numbness or tingling  PALPATION: No tenderness to palpation reported  LOWER EXTREMITY ROM:  Active ROM Right eval Left eval  Hip flexion    Hip extension    Hip abduction    Hip adduction    Hip internal rotation    Hip external rotation    Knee flexion 130 126  Knee extension 6 3  Ankle dorsiflexion    Ankle plantarflexion    Ankle inversion    Ankle eversion     (Blank rows = not tested)  LOWER EXTREMITY MMT:  MMT Right eval Left eval  Hip flexion 4-/5 4-/5  Hip extension    Hip abduction    Hip adduction    Hip internal rotation    Hip external rotation    Knee flexion 4+/5 4+/5  Knee extension 4+/5 4/5  Ankle dorsiflexion 3/5 3/5  Ankle plantarflexion    Ankle inversion    Ankle eversion     (Blank rows = not tested)  FUNCTIONAL TESTS:  5 times sit to stand: 21.59 second without UE support Timed up and go (TUG): 14.97 seconds  GAIT: Assistive device utilized: None Level of assistance: Complete Independence Comments: decreased stride length, gait speed, and reduced knee flexion with swing phase bilaterally                                                                                                                              TREATMENT DATE:                                     11/02/23 EXERCISE LOG  Exercise Repetitions and Resistance Comments  Nustep  L5 x 15 minutes   Seated clams  Blue t-band x 3 minutes   LAQ Blue t-band x 10 reps    Side stepping on foam  3 minutes  2 HHA progressing to 1 HHA   Rocker board  2 minutes each AP and lateral; 1 HHA   Standing gastroc stretch  2 minutes    Blank cell = exercise not performed today   10/25/23     EXERCISE LOG  Exercise Repetitions and Resistance Comments  Nustep  L4 x 17 minutes   5 STS 14.78 seconds   TUG 11.74 seconds   Rockerboard 3 mins   BOSU controls  3 mins AP  and lateral  Standing gastroc stretch  3 mins   Tandem Gait 3 mins   Side stepping on foam  3 minutes   LAQ  Alternating LE  Seated clams     Sit to stand   Without UE support   Blank cell = exercise not performed today                                    10/23/23 EXERCISE LOG  Exercise Repetitions and Resistance Comments  Nustep  L4 x 15.5 minutes    Rocker board  3 minutes   Marching on foam  2.5 minutes    Static stance on foam  3 minutes  Intermittent UE support from parallel bars   LAQ 5# x 3 minutes  Alternating LE   Seated hip ADD isometric  3 minutes w/ 5 second hold   Standing HS curl  5# x 2 minutes  Alternating LE  Side stepping on foam  6 laps BUE support from parallel bars   Blank cell = exercise not performed today   PATIENT EDUCATION:  Education details: exercise progression Person educated: Patient Education method: Explanation Education comprehension: verbalized understanding  HOME EXERCISE PROGRAM:   ASSESSMENT:  CLINICAL IMPRESSION: Today's treatment focused on familiar interventions due to feeling ill.  She required minimal cueing with today's interventions for proper pacing to avoid a significant increase in fatigue.  She was she was able to be progressed from a 2 handhold assist to a 1 handhold assist from the parallel bars with today's standing balance interventions.  She reported feeling alright upon conclusion of treatment.  Recommend that she continue with skilled physical therapy to address her remaining impairments to maximize her safety and functional mobility.  OBJECTIVE IMPAIRMENTS: Abnormal gait, decreased activity tolerance, decreased balance, decreased mobility, difficulty walking, decreased strength, and pain.   ACTIVITY LIMITATIONS: carrying, lifting, standing, squatting, stairs, transfers, and locomotion level  PARTICIPATION LIMITATIONS: meal prep, cleaning, laundry, shopping, community activity, and yard work  PERSONAL FACTORS: Age,  Past/current experiences, Time since onset of injury/illness/exacerbation, and 3+ comorbidities: Hypertension, asthma, allergies, arthritis, history of COVID-19, and depression are also affecting patient's functional outcome.   REHAB POTENTIAL: Good  CLINICAL DECISION MAKING: Evolving/moderate complexity  EVALUATION COMPLEXITY: Moderate   GOALS: Goals reviewed with patient? Yes  SHORT TERM GOALS: Target date: 10/30/23 Patient will be independent with her initial HEP. Baseline: Goal status: MET  2.  Patient will improve her 5 times sit to stand time to 16 seconds or less for improved lower extremity power. Baseline: 5/27: 14.78 seconds Goal status: MET  3.  Patient will improve her bilateral hip flexor strength to at least 4/5 for improved functional mobility. Baseline: 5/27: 4+/5 RLE 4/5 LLE Goal status: MET  LONG TERM GOALS: Target date: 11/20/23  Patient will be independent with her advanced HEP. Baseline:  Goal status: IN PROGRESS  2.  Patient will improve her 5 times sit to stand tab to 12 seconds or less to reduce her fall risk. Baseline: 5/27: 14.78 seconds  Goal status: IN PROGRESS  3.  Patient will improve her timed up and go time to 12 seconds or less to reduce her fall risk. Baseline: 5/27: 11.74 seconds Goal status: MET  4.  Patient will improve her LEFS score to at least 35/80 for improved perceived function with her daily activities. Baseline: 5/27: 23/80 Goal status: IN PROGRESS  PLAN:  PT FREQUENCY: 2x/week  PT DURATION: 6 weeks  PLANNED INTERVENTIONS: 97164- PT Re-evaluation, 97750- Physical Performance Testing, 97110-Therapeutic exercises, 97530- Therapeutic activity, [id number]- Neuromuscular re-education, 97535- Self Care, 54098- Manual therapy, [phone]- Gait training, [phone]- Electrical stimulation (unattended), 97016- Vasopneumatic device, Patient/Family education, Balance training, Stair training, Joint mobilization, Cryotherapy, and Moist heat  PLAN FOR  NEXT SESSION: NuStep, lower extremity strengthening, balance interventions, gait training   Lane Pinon, PT 11/02/2023, 1:03 PM

## 2023-11-06 ENCOUNTER — Ambulatory Visit: Attending: Family Medicine

## 2023-11-06 DIAGNOSIS — Z9181 History of falling: Secondary | ICD-10-CM | POA: Insufficient documentation

## 2023-11-06 DIAGNOSIS — R2689 Other abnormalities of gait and mobility: Secondary | ICD-10-CM | POA: Diagnosis present

## 2023-11-06 NOTE — Therapy (Signed)
 OUTPATIENT PHYSICAL THERAPY LOWER EXTREMITY TREATMENT   Patient Name: Traci Johnson MRN: [id number] DOB:08/01/1950, 73 y.o., female Today's Date: 11/06/2023  END OF SESSION:  PT End of Session - 11/06/23 1018     Visit Number 8    Number of Visits 12    Date for PT Re-Evaluation 12/28/23    PT Start Time 1015    PT Stop Time 1100    PT Time Calculation (min) 45 min    Activity Tolerance Patient tolerated treatment well    Behavior During Therapy WFL for tasks assessed/performed                 Past Medical History:  Diagnosis Date   Allergy    Arthritis    Asthma    Chronic fatigue    COVID-19    Depression    Hyperparathyroidism (HCC)    Hypertension    Migraines    cluster   Sleep apnea    Past Surgical History:  Procedure Laterality Date   ABDOMINAL HYSTERECTOMY  1985   CHOLECYSTECTOMY N/A 05/02/2017   Procedure: LAPAROSCOPIC CHOLECYSTECTOMY;  Surgeon: Derral Flick, MD;  Location: [facility];  Service: General;  Laterality: N/A;   LEFT HEART CATH AND CORONARY ANGIOGRAPHY N/A 04/30/2017   Procedure: LEFT HEART CATH AND CORONARY ANGIOGRAPHY;  Surgeon: Avanell Leigh, MD;  Location: [facility];  Service: Cardiovascular;  Laterality: N/A;   LIGAMENT REPAIR Right    birth defect   PARATHYROIDECTOMY Left 03/10/2021   Procedure: LEFT INFERIOR PARATHYROIDECTOMY;  Surgeon: Oralee Billow, MD;  Location: [facility];  Service: General;  Laterality: Left;   REPLACEMENT TOTAL KNEE BILATERAL     TOTAL HIP ARTHROPLASTY Right    Patient Active Problem List   Diagnosis Date Noted   Facet arthropathy, lumbar 07/17/2023   Chronic left sacroiliac joint pain 07/17/2023   Lumbar trigger point syndrome 04/12/2022   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) 02/14/2022   Painful total knee replacement, left (HCC) 02/01/2022   Acquired leg length discrepancy 08/23/2021   Degenerative lumbar spinal stenosis 08/23/2021   Rectocele 07/07/2021   S/P  hysterectomy 07/07/2021   Hyperparathyroidism, primary (HCC) 03/06/2021   Encounter for screening fecal occult blood testing 05/13/2020   Pelvic relaxation 05/13/2020   Encounter for well woman exam with routine gynecological exam 03/25/2018   Screening for colorectal cancer 03/25/2018   Fecal occult blood test positive 03/25/2018   Current use of estrogen therapy 03/25/2018   Orthostatic hypotension 05/03/2017   Syncope due to orthostatic hypotension 05/03/2017   Symptomatic cholelithiasis 05/02/2017   Acute cholecystitis 05/02/2017   Abnormal nuclear stress test    Chest pain 04/27/2017   Hyperglycemia 04/27/2017   Anemia 04/27/2017   Essential hypertension 07/15/2014   Mild persistent asthma without complication 07/15/2014   Vitamin D  deficiency 07/15/2014    PCP: Lorre Rosin, NP  REFERRING PROVIDER: Isidro Margo, DO   REFERRING DIAG: Arthritis of knee, Balance problem, Coordination problem   THERAPY DIAG:  Other abnormalities of gait and mobility  History of falling  Rationale for Evaluation and Treatment: Rehabilitation  ONSET DATE: chronic  SUBJECTIVE:   SUBJECTIVE STATEMENT: Patient reports that she feels a lot better today than she did at her last appointment.   PERTINENT HISTORY: Hypertension, asthma, allergies, arthritis, history of COVID-19, and depression PAIN:  Are you having pain? No  PRECAUTIONS: Fall  RED FLAGS: None   WEIGHT BEARING RESTRICTIONS: No  FALLS:  Has patient fallen in  last 6 months? Yes. Number of falls 3-4  LIVING ENVIRONMENT: Lives with: lives alone Lives in: House/apartment Stairs: Yes: External: 2 steps; none; step to pattern Has following equipment at home: None  OCCUPATION: retired  PLOF: Independent  PATIENT GOALS: improved safety and reduced fall risk  NEXT MD VISIT: none scheduled  OBJECTIVE:  Note: Objective measures were completed at Evaluation unless otherwise noted.  PATIENT SURVEYS:  LEFS  18/80  COGNITION: Overall cognitive status: Within functional limits for tasks assessed     SENSATION: Patient reports no numbness or tingling  PALPATION: No tenderness to palpation reported  LOWER EXTREMITY ROM:  Active ROM Right eval Left eval  Hip flexion    Hip extension    Hip abduction    Hip adduction    Hip internal rotation    Hip external rotation    Knee flexion 130 126  Knee extension 6 3  Ankle dorsiflexion    Ankle plantarflexion    Ankle inversion    Ankle eversion     (Blank rows = not tested)  LOWER EXTREMITY MMT:  MMT Right eval Left eval  Hip flexion 4-/5 4-/5  Hip extension    Hip abduction    Hip adduction    Hip internal rotation    Hip external rotation    Knee flexion 4+/5 4+/5  Knee extension 4+/5 4/5  Ankle dorsiflexion 3/5 3/5  Ankle plantarflexion    Ankle inversion    Ankle eversion     (Blank rows = not tested)  FUNCTIONAL TESTS:  5 times sit to stand: 21.59 second without UE support Timed up and go (TUG): 14.97 seconds  GAIT: Assistive device utilized: None Level of assistance: Complete Independence Comments: decreased stride length, gait speed, and reduced knee flexion with swing phase bilaterally                                                                                                                              TREATMENT DATE:                                     11/06/23 EXERCISE LOG  Exercise Repetitions and Resistance Comments  Nustep  L5 x 15.5 minutes    Cybex leg press  1 plate @ seat 6 x 4 minutes    Single leg high knee to hip extension 2 minutes   Rocker board  2 minutes each AP and lateral; 2-1 HHA from parallel bars  Sit to stand 20 reps  Without UE support  Walking on foam  3 minutes  Narrow BOS w/ BUE support from parallel bars  Seated HS stretch  3 x 30 seconds each    Blank cell = exercise not performed today  11/02/23 EXERCISE LOG  Exercise Repetitions and  Resistance Comments  Nustep  L5 x 15 minutes   Seated clams  Blue t-band x 3 minutes   LAQ Blue t-band x 10 reps    Side stepping on foam  3 minutes  2 HHA progressing to 1 HHA   Rocker board  2 minutes each AP and lateral; 1 HHA   Standing gastroc stretch  2 minutes    Blank cell = exercise not performed today   10/25/23     EXERCISE LOG  Exercise Repetitions and Resistance Comments  Nustep  L4 x 17 minutes   5 STS 14.78 seconds   TUG 11.74 seconds   Rockerboard 3 mins   BOSU controls  3 mins AP and lateral   Standing gastroc stretch  3 mins   Tandem Gait 3 mins   Side stepping on foam  3 minutes   LAQ  Alternating LE  Seated clams     Sit to stand   Without UE support   Blank cell = exercise not performed today   PATIENT EDUCATION:  Education details: exercise progression Person educated: Patient Education method: Explanation Education comprehension: verbalized understanding  HOME EXERCISE PROGRAM:   ASSESSMENT:  CLINICAL IMPRESSION: Patient was progressed with a weighted leg press and other familiar interventions for improved functional mobility. She required minimal cueing with the leg press for improved eccentric quadriceps control. She required a brief seated rest break after today's standing interventions. She reported feeling good upon the conclusion of treatment. She continues to require skilled physical therapy to address her remaining impairments to maximize her safety and functional mobility.   OBJECTIVE IMPAIRMENTS: Abnormal gait, decreased activity tolerance, decreased balance, decreased mobility, difficulty walking, decreased strength, and pain.   ACTIVITY LIMITATIONS: carrying, lifting, standing, squatting, stairs, transfers, and locomotion level  PARTICIPATION LIMITATIONS: meal prep, cleaning, laundry, shopping, community activity, and yard work  PERSONAL FACTORS: Age, Past/current experiences, Time since onset of injury/illness/exacerbation, and 3+  comorbidities: Hypertension, asthma, allergies, arthritis, history of COVID-19, and depression are also affecting patient's functional outcome.   REHAB POTENTIAL: Good  CLINICAL DECISION MAKING: Evolving/moderate complexity  EVALUATION COMPLEXITY: Moderate   GOALS: Goals reviewed with patient? Yes  SHORT TERM GOALS: Target date: 10/30/23 Patient will be independent with her initial HEP. Baseline: Goal status: MET  2.  Patient will improve her 5 times sit to stand time to 16 seconds or less for improved lower extremity power. Baseline: 5/27: 14.78 seconds Goal status: MET  3.  Patient will improve her bilateral hip flexor strength to at least 4/5 for improved functional mobility. Baseline: 5/27: 4+/5 RLE 4/5 LLE Goal status: MET  LONG TERM GOALS: Target date: 11/20/23  Patient will be independent with her advanced HEP. Baseline:  Goal status: IN PROGRESS  2.  Patient will improve her 5 times sit to stand tab to 12 seconds or less to reduce her fall risk. Baseline: 5/27: 14.78 seconds  Goal status: IN PROGRESS  3.  Patient will improve her timed up and go time to 12 seconds or less to reduce her fall risk. Baseline: 5/27: 11.74 seconds Goal status: MET  4.  Patient will improve her LEFS score to at least 35/80 for improved perceived function with her daily activities. Baseline: 5/27: 23/80 Goal status: IN PROGRESS  PLAN:  PT FREQUENCY: 2x/week  PT DURATION: 6 weeks  PLANNED INTERVENTIONS: 97164- PT Re-evaluation, 97750- Physical Performance Testing, 97110-Therapeutic exercises, 97530- Therapeutic activity, [id number]- Neuromuscular re-education, 97535- Self Care, 16109-  Manual therapy, [id number]- Gait training, [phone]- Electrical stimulation (unattended), [phone]- Vasopneumatic device, Patient/Family education, Balance training, Stair training, Joint mobilization, Cryotherapy, and Moist heat  PLAN FOR NEXT SESSION: NuStep, lower extremity strengthening, balance interventions, gait  training   Lane Pinon, PT 11/06/2023, 12:50 PM

## 2023-11-08 ENCOUNTER — Ambulatory Visit

## 2023-11-08 DIAGNOSIS — R2689 Other abnormalities of gait and mobility: Secondary | ICD-10-CM

## 2023-11-08 DIAGNOSIS — Z9181 History of falling: Secondary | ICD-10-CM

## 2023-11-08 NOTE — Therapy (Signed)
 OUTPATIENT PHYSICAL THERAPY LOWER EXTREMITY TREATMENT   Patient Name: Traci Johnson MRN: [id number] DOB:1950/09/29, 73 y.o., female Today's Date: 11/08/2023  END OF SESSION:  PT End of Session - 11/08/23 1029     Visit Number 9    Number of Visits 12    Date for PT Re-Evaluation 12/28/23    PT Start Time 1015    PT Stop Time 1103    PT Time Calculation (min) 48 min    Activity Tolerance Patient tolerated treatment well    Behavior During Therapy WFL for tasks assessed/performed                  Past Medical History:  Diagnosis Date   Allergy    Arthritis    Asthma    Chronic fatigue    COVID-19    Depression    Hyperparathyroidism (HCC)    Hypertension    Migraines    cluster   Sleep apnea    Past Surgical History:  Procedure Laterality Date   ABDOMINAL HYSTERECTOMY  1985   CHOLECYSTECTOMY N/A 05/02/2017   Procedure: LAPAROSCOPIC CHOLECYSTECTOMY;  Surgeon: Derral Flick, MD;  Location: [facility];  Service: General;  Laterality: N/A;   LEFT HEART CATH AND CORONARY ANGIOGRAPHY N/A 04/30/2017   Procedure: LEFT HEART CATH AND CORONARY ANGIOGRAPHY;  Surgeon: Avanell Leigh, MD;  Location: [facility];  Service: Cardiovascular;  Laterality: N/A;   LIGAMENT REPAIR Right    birth defect   PARATHYROIDECTOMY Left 03/10/2021   Procedure: LEFT INFERIOR PARATHYROIDECTOMY;  Surgeon: Oralee Billow, MD;  Location: [facility];  Service: General;  Laterality: Left;   REPLACEMENT TOTAL KNEE BILATERAL     TOTAL HIP ARTHROPLASTY Right    Patient Active Problem List   Diagnosis Date Noted   Facet arthropathy, lumbar 07/17/2023   Chronic left sacroiliac joint pain 07/17/2023   Lumbar trigger point syndrome 04/12/2022   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) 02/14/2022   Painful total knee replacement, left (HCC) 02/01/2022   Acquired leg length discrepancy 08/23/2021   Degenerative lumbar spinal stenosis 08/23/2021   Rectocele 07/07/2021   S/P  hysterectomy 07/07/2021   Hyperparathyroidism, primary (HCC) 03/06/2021   Encounter for screening fecal occult blood testing 05/13/2020   Pelvic relaxation 05/13/2020   Encounter for well woman exam with routine gynecological exam 03/25/2018   Screening for colorectal cancer 03/25/2018   Fecal occult blood test positive 03/25/2018   Current use of estrogen therapy 03/25/2018   Orthostatic hypotension 05/03/2017   Syncope due to orthostatic hypotension 05/03/2017   Symptomatic cholelithiasis 05/02/2017   Acute cholecystitis 05/02/2017   Abnormal nuclear stress test    Chest pain 04/27/2017   Hyperglycemia 04/27/2017   Anemia 04/27/2017   Essential hypertension 07/15/2014   Mild persistent asthma without complication 07/15/2014   Vitamin D  deficiency 07/15/2014    PCP: Lorre Rosin, NP  REFERRING PROVIDER: Isidro Margo, DO   REFERRING DIAG: Arthritis of knee, Balance problem, Coordination problem   THERAPY DIAG:  Other abnormalities of gait and mobility  History of falling  Rationale for Evaluation and Treatment: Rehabilitation  ONSET DATE: chronic  SUBJECTIVE:   SUBJECTIVE STATEMENT: Patient reports that she felt really good after doing the leg press at her last appointment.   PERTINENT HISTORY: Hypertension, asthma, allergies, arthritis, history of COVID-19, and depression PAIN:  Are you having pain? No  PRECAUTIONS: Fall  RED FLAGS: None   WEIGHT BEARING RESTRICTIONS: No  FALLS:  Has patient fallen  in last 6 months? Yes. Number of falls 3-4  LIVING ENVIRONMENT: Lives with: lives alone Lives in: House/apartment Stairs: Yes: External: 2 steps; none; step to pattern Has following equipment at home: None  OCCUPATION: retired  PLOF: Independent  PATIENT GOALS: improved safety and reduced fall risk  NEXT MD VISIT: none scheduled  OBJECTIVE:  Note: Objective measures were completed at Evaluation unless otherwise noted.  PATIENT SURVEYS:   LEFS 18/80  COGNITION: Overall cognitive status: Within functional limits for tasks assessed     SENSATION: Patient reports no numbness or tingling  PALPATION: No tenderness to palpation reported  LOWER EXTREMITY ROM:  Active ROM Right eval Left eval  Hip flexion    Hip extension    Hip abduction    Hip adduction    Hip internal rotation    Hip external rotation    Knee flexion 130 126  Knee extension 6 3  Ankle dorsiflexion    Ankle plantarflexion    Ankle inversion    Ankle eversion     (Blank rows = not tested)  LOWER EXTREMITY MMT:  MMT Right eval Left eval  Hip flexion 4-/5 4-/5  Hip extension    Hip abduction    Hip adduction    Hip internal rotation    Hip external rotation    Knee flexion 4+/5 4+/5  Knee extension 4+/5 4/5  Ankle dorsiflexion 3/5 3/5  Ankle plantarflexion    Ankle inversion    Ankle eversion     (Blank rows = not tested)  FUNCTIONAL TESTS:  5 times sit to stand: 21.59 second without UE support Timed up and go (TUG): 14.97 seconds  GAIT: Assistive device utilized: None Level of assistance: Complete Independence Comments: decreased stride length, gait speed, and reduced knee flexion with swing phase bilaterally                                                                                                                              TREATMENT DATE:                                     11/08/23 EXERCISE LOG  Exercise Repetitions and Resistance Comments  Nustep  L6 x 15 minutes   Rocker board  3 minutes each  AP and lateral; 2 HHA from parallel bars  Leg press 1 plate @ seat 6 x 4 minutes    Marching on BOSU Ball up x 3.5 minutes   Seated HS curl Blue t-band x 1.5 minutes each     Blank cell = exercise not performed today                                    11/06/23 EXERCISE LOG  Exercise Repetitions and Resistance Comments  Nustep  L5 x 15.5 minutes  Cybex leg press  1 plate @ seat 6 x 4 minutes    Single leg high knee to  hip extension 2 minutes   Rocker board  2 minutes each AP and lateral; 2-1 HHA from parallel bars  Sit to stand 20 reps  Without UE support  Walking on foam  3 minutes  Narrow BOS w/ BUE support from parallel bars  Seated HS stretch  3 x 30 seconds each    Blank cell = exercise not performed today                                    11/02/23 EXERCISE LOG  Exercise Repetitions and Resistance Comments  Nustep  L5 x 15 minutes   Seated clams  Blue t-band x 3 minutes   LAQ Blue t-band x 10 reps    Side stepping on foam  3 minutes  2 HHA progressing to 1 HHA   Rocker board  2 minutes each AP and lateral; 1 HHA   Standing gastroc stretch  2 minutes    Blank cell = exercise not performed today   PATIENT EDUCATION:  Education details: exercise progression Person educated: Patient Education method: Explanation Education comprehension: verbalized understanding  HOME EXERCISE PROGRAM:   ASSESSMENT:  CLINICAL IMPRESSION: Patient was progressed with familiar interventions for improved dynamic stability. She required minimal cueing with seated hamstrings for proper exercise performance to facilitate increased hamstring engagement. She experienced no increase in pain or discomfort with any of today's interventions. She reported feeling good upon the conclusion of treatment. She continues to require skilled physical therapy to address her remaining impairments to maximize her safety and functional mobility.    OBJECTIVE IMPAIRMENTS: Abnormal gait, decreased activity tolerance, decreased balance, decreased mobility, difficulty walking, decreased strength, and pain.   ACTIVITY LIMITATIONS: carrying, lifting, standing, squatting, stairs, transfers, and locomotion level  PARTICIPATION LIMITATIONS: meal prep, cleaning, laundry, shopping, community activity, and yard work  PERSONAL FACTORS: Age, Past/current experiences, Time since onset of injury/illness/exacerbation, and 3+ comorbidities:  Hypertension, asthma, allergies, arthritis, history of COVID-19, and depression are also affecting patient's functional outcome.   REHAB POTENTIAL: Good  CLINICAL DECISION MAKING: Evolving/moderate complexity  EVALUATION COMPLEXITY: Moderate   GOALS: Goals reviewed with patient? Yes  SHORT TERM GOALS: Target date: 10/30/23 Patient will be independent with her initial HEP. Baseline: Goal status: MET  2.  Patient will improve her 5 times sit to stand time to 16 seconds or less for improved lower extremity power. Baseline: 5/27: 14.78 seconds Goal status: MET  3.  Patient will improve her bilateral hip flexor strength to at least 4/5 for improved functional mobility. Baseline: 5/27: 4+/5 RLE 4/5 LLE Goal status: MET  LONG TERM GOALS: Target date: 11/20/23  Patient will be independent with her advanced HEP. Baseline:  Goal status: IN PROGRESS  2.  Patient will improve her 5 times sit to stand tab to 12 seconds or less to reduce her fall risk. Baseline: 5/27: 14.78 seconds  Goal status: IN PROGRESS  3.  Patient will improve her timed up and go time to 12 seconds or less to reduce her fall risk. Baseline: 5/27: 11.74 seconds Goal status: MET  4.  Patient will improve her LEFS score to at least 35/80 for improved perceived function with her daily activities. Baseline: 5/27: 23/80 Goal status: IN PROGRESS  PLAN:  PT FREQUENCY: 2x/week  PT DURATION: 6 weeks  PLANNED  INTERVENTIONS: 97164- PT Re-evaluation, 97750- Physical Performance Testing, 97110-Therapeutic exercises, 97530- Therapeutic activity, [id number]- Neuromuscular re-education, [phone]- Self Care, 60454- Manual therapy, [phone]- Gait training, [phone]- Electrical stimulation (unattended), 97016- Vasopneumatic device, Patient/Family education, Balance training, Stair training, Joint mobilization, Cryotherapy, and Moist heat  PLAN FOR NEXT SESSION: NuStep, lower extremity strengthening, balance interventions, gait  training   Lane Pinon, PT 11/08/2023, 6:02 PM

## 2023-11-13 ENCOUNTER — Ambulatory Visit: Admitting: *Deleted

## 2023-11-13 ENCOUNTER — Encounter: Payer: Self-pay | Admitting: *Deleted

## 2023-11-13 DIAGNOSIS — Z9181 History of falling: Secondary | ICD-10-CM

## 2023-11-13 DIAGNOSIS — R2689 Other abnormalities of gait and mobility: Secondary | ICD-10-CM

## 2023-11-13 NOTE — Therapy (Addendum)
 OUTPATIENT PHYSICAL THERAPY LOWER EXTREMITY TREATMENT   Patient Name: Traci Johnson MRN: [id number] DOB:1950-10-13, 73 y.o., female Today's Date: 11/13/2023  END OF SESSION:  PT End of Session - 11/13/23 1032     Visit Number 10    Number of Visits 12    Date for PT Re-Evaluation 12/28/23    PT Start Time 1015    PT Stop Time 1105    PT Time Calculation (min) 50 min                  Past Medical History:  Diagnosis Date   Allergy    Arthritis    Asthma    Chronic fatigue    COVID-19    Depression    Hyperparathyroidism (HCC)    Hypertension    Migraines    cluster   Sleep apnea    Past Surgical History:  Procedure Laterality Date   ABDOMINAL HYSTERECTOMY  1985   CHOLECYSTECTOMY N/A 05/02/2017   Procedure: LAPAROSCOPIC CHOLECYSTECTOMY;  Surgeon: Derral Flick, MD;  Location: [facility];  Service: General;  Laterality: N/A;   LEFT HEART CATH AND CORONARY ANGIOGRAPHY N/A 04/30/2017   Procedure: LEFT HEART CATH AND CORONARY ANGIOGRAPHY;  Surgeon: Avanell Leigh, MD;  Location: [facility];  Service: Cardiovascular;  Laterality: N/A;   LIGAMENT REPAIR Right    birth defect   PARATHYROIDECTOMY Left 03/10/2021   Procedure: LEFT INFERIOR PARATHYROIDECTOMY;  Surgeon: Oralee Billow, MD;  Location: [facility];  Service: General;  Laterality: Left;   REPLACEMENT TOTAL KNEE BILATERAL     TOTAL HIP ARTHROPLASTY Right    Patient Active Problem List   Diagnosis Date Noted   Facet arthropathy, lumbar 07/17/2023   Chronic left sacroiliac joint pain 07/17/2023   Lumbar trigger point syndrome 04/12/2022   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) 02/14/2022   Painful total knee replacement, left (HCC) 02/01/2022   Acquired leg length discrepancy 08/23/2021   Degenerative lumbar spinal stenosis 08/23/2021   Rectocele 07/07/2021   S/P hysterectomy 07/07/2021   Hyperparathyroidism, primary (HCC) 03/06/2021   Encounter for screening fecal occult blood  testing 05/13/2020   Pelvic relaxation 05/13/2020   Encounter for well woman exam with routine gynecological exam 03/25/2018   Screening for colorectal cancer 03/25/2018   Fecal occult blood test positive 03/25/2018   Current use of estrogen therapy 03/25/2018   Orthostatic hypotension 05/03/2017   Syncope due to orthostatic hypotension 05/03/2017   Symptomatic cholelithiasis 05/02/2017   Acute cholecystitis 05/02/2017   Abnormal nuclear stress test    Chest pain 04/27/2017   Hyperglycemia 04/27/2017   Anemia 04/27/2017   Essential hypertension 07/15/2014   Mild persistent asthma without complication 07/15/2014   Vitamin D  deficiency 07/15/2014    PCP: Lorre Rosin, NP  REFERRING PROVIDER: Isidro Margo, DO   REFERRING DIAG: Arthritis of knee, Balance problem, Coordination problem   THERAPY DIAG:  Other abnormalities of gait and mobility  History of falling  Rationale for Evaluation and Treatment: Rehabilitation  ONSET DATE: chronic  SUBJECTIVE:   SUBJECTIVE STATEMENT: Patient reports doing better over all. DC after 2 more visits  PERTINENT HISTORY: Hypertension, asthma, allergies, arthritis, history of COVID-19, and depression PAIN:  Are you having pain? No  PRECAUTIONS: Fall  RED FLAGS: None   WEIGHT BEARING RESTRICTIONS: No  FALLS:  Has patient fallen in last 6 months? Yes. Number of falls 3-4  LIVING ENVIRONMENT: Lives with: lives alone Lives in: House/apartment Stairs: Yes: External: 2 steps; none;  step to pattern Has following equipment at home: None  OCCUPATION: retired  PLOF: Independent  PATIENT GOALS: improved safety and reduced fall risk  NEXT MD VISIT: none scheduled  OBJECTIVE:  Note: Objective measures were completed at Evaluation unless otherwise noted.  PATIENT SURVEYS:  LEFS 18/80  COGNITION: Overall cognitive status: Within functional limits for tasks assessed     SENSATION: Patient reports no numbness or  tingling  PALPATION: No tenderness to palpation reported  LOWER EXTREMITY ROM:  Active ROM Right eval Left eval  Hip flexion    Hip extension    Hip abduction    Hip adduction    Hip internal rotation    Hip external rotation    Knee flexion 130 126  Knee extension 6 3  Ankle dorsiflexion    Ankle plantarflexion    Ankle inversion    Ankle eversion     (Blank rows = not tested)  LOWER EXTREMITY MMT:  MMT Right eval Left eval  Hip flexion 4-/5 4-/5  Hip extension    Hip abduction    Hip adduction    Hip internal rotation    Hip external rotation    Knee flexion 4+/5 4+/5  Knee extension 4+/5 4/5  Ankle dorsiflexion 3/5 3/5  Ankle plantarflexion    Ankle inversion    Ankle eversion     (Blank rows = not tested)  FUNCTIONAL TESTS:  5 times sit to stand: 21.59 second without UE support Timed up and go (TUG): 14.97 seconds  GAIT: Assistive device utilized: None Level of assistance: Complete Independence Comments: decreased stride length, gait speed, and reduced knee flexion with swing phase bilaterally                                                                                                                              TREATMENT DATE:                                     11/13/23 EXERCISE LOG  Exercise Repetitions and Resistance Comments  Nustep  L6 x 15 minutes   Rocker board  4 minutes each  AP and lateral; 2 HHA from parallel bars  Leg press 1 plate @ seat 6 x 5 minutes    Marching on BOSU    Seated HS curl Green t-band  x 30 each side    SLS X 4 mins total  5 sec max holds  Tandem stance X 4 mins    Blank cell = exercise not performed today  Reviewed HEP for balance                                   11/06/23 EXERCISE LOG  Exercise Repetitions and Resistance Comments  Nustep  L5 x 15.5 minutes    Cybex leg press  1 plate @ seat 6 x 4 minutes    Single leg high knee to hip extension 2 minutes   Rocker board  2 minutes each AP and lateral; 2-1  HHA from parallel bars  Sit to stand 20 reps  Without UE support  Walking on foam  3 minutes  Narrow BOS w/ BUE support from parallel bars  Seated HS stretch  3 x 30 seconds each    Blank cell = exercise not performed today                                    11/02/23 EXERCISE LOG  Exercise Repetitions and Resistance Comments  Nustep  L5 x 15 minutes   Seated clams  Blue t-band x 3 minutes   LAQ Blue t-band x 10 reps    Side stepping on foam  3 minutes  2 HHA progressing to 1 HHA   Rocker board  2 minutes each AP and lateral; 1 HHA   Standing gastroc stretch  2 minutes    Blank cell = exercise not performed today   PATIENT EDUCATION:  Education details: exercise progression Person educated: Patient Education method: Explanation Education comprehension: verbalized understanding  HOME EXERCISE PROGRAM:   ASSESSMENT:  CLINICAL IMPRESSION: Patient  arrived today doing fairly well and was able to continue with balance act.'s and LE strengthening exs. SLS was still very challenging for Pt with 5 sec max hold. HEP discussed for SLS and tandem holds.Pt fatigued end of session.    OBJECTIVE IMPAIRMENTS: Abnormal gait, decreased activity tolerance, decreased balance, decreased mobility, difficulty walking, decreased strength, and pain.   ACTIVITY LIMITATIONS: carrying, lifting, standing, squatting, stairs, transfers, and locomotion level  PARTICIPATION LIMITATIONS: meal prep, cleaning, laundry, shopping, community activity, and yard work  PERSONAL FACTORS: Age, Past/current experiences, Time since onset of injury/illness/exacerbation, and 3+ comorbidities: Hypertension, asthma, allergies, arthritis, history of COVID-19, and depression are also affecting patient's functional outcome.   REHAB POTENTIAL: Good  CLINICAL DECISION MAKING: Evolving/moderate complexity  EVALUATION COMPLEXITY: Moderate   GOALS: Goals reviewed with patient? Yes  SHORT TERM GOALS: Target date:  10/30/23 Patient will be independent with her initial HEP. Baseline: Goal status: MET  2.  Patient will improve her 5 times sit to stand time to 16 seconds or less for improved lower extremity power. Baseline: 5/27: 14.78 seconds Goal status: MET  3.  Patient will improve her bilateral hip flexor strength to at least 4/5 for improved functional mobility. Baseline: 5/27: 4+/5 RLE 4/5 LLE Goal status: MET  LONG TERM GOALS: Target date: 11/20/23  Patient will be independent with her advanced HEP. Baseline:  Goal status: Partially Met  2.  Patient will improve her 5 times sit to stand tab to 12 seconds or less to reduce her fall risk. Baseline: 5/27: 14.78 seconds  Goal status:  Partially met  3.  Patient will improve her timed up and go time to 12 seconds or less to reduce her fall risk. Baseline: 5/27: 11.74 seconds Goal status: MET  4.  Patient will improve her LEFS score to at least 35/80 for improved perceived function with her daily activities. Baseline: 5/27: 23/80 Goal status: IN PROGRESS  PLAN:  PT FREQUENCY: 2x/week  PT DURATION: 6 weeks  PLANNED INTERVENTIONS: 97164- PT Re-evaluation, 97750- Physical Performance Testing, 97110-Therapeutic exercises, 97530- Therapeutic activity, [id number]- Neuromuscular re-education, 97535- Self Care, [id number]- Manual therapy, [id number]- Gait training, [phone]- Electrical stimulation (  unattended), [phone]- Vasopneumatic device, Patient/Family education, Balance training, Stair training, Joint mobilization, Cryotherapy, and Moist heat  PLAN FOR NEXT SESSION:    2   visits left then DC to HEP     NuStep, lower extremity strengthening, balance interventions, gait training   Avah Bashor,CHRIS, PTA 11/13/2023, 12:59 PM   Progress Note Reporting Period 10/09/23 to 11/13/23.  See note below for Objective Data and Assessment of Progress/Goals. Good progress toward goals.  STG's met at this time.    Chad Applegate MPT

## 2023-11-15 ENCOUNTER — Ambulatory Visit: Admitting: *Deleted

## 2023-11-15 ENCOUNTER — Encounter: Payer: Self-pay | Admitting: *Deleted

## 2023-11-15 DIAGNOSIS — R2689 Other abnormalities of gait and mobility: Secondary | ICD-10-CM | POA: Diagnosis not present

## 2023-11-15 DIAGNOSIS — Z9181 History of falling: Secondary | ICD-10-CM

## 2023-11-15 NOTE — Therapy (Signed)
 OUTPATIENT PHYSICAL THERAPY LOWER EXTREMITY TREATMENT   Patient Name: Traci Johnson MRN: [id number] DOB:21-May-1951, 73 y.o., female Today's Date: 11/15/2023  END OF SESSION:  PT End of Session - 11/15/23 1033     Visit Number 11    Number of Visits 12    Date for PT Re-Evaluation 12/28/23    PT Start Time 1015               Past Medical History:  Diagnosis Date   Allergy    Arthritis    Asthma    Chronic fatigue    COVID-19    Depression    Hyperparathyroidism (HCC)    Hypertension    Migraines    cluster   Sleep apnea    Past Surgical History:  Procedure Laterality Date   ABDOMINAL HYSTERECTOMY  1985   CHOLECYSTECTOMY N/A 05/02/2017   Procedure: LAPAROSCOPIC CHOLECYSTECTOMY;  Surgeon: Derral Flick, MD;  Location: [facility];  Service: General;  Laterality: N/A;   LEFT HEART CATH AND CORONARY ANGIOGRAPHY N/A 04/30/2017   Procedure: LEFT HEART CATH AND CORONARY ANGIOGRAPHY;  Surgeon: Avanell Leigh, MD;  Location: [facility];  Service: Cardiovascular;  Laterality: N/A;   LIGAMENT REPAIR Right    birth defect   PARATHYROIDECTOMY Left 03/10/2021   Procedure: LEFT INFERIOR PARATHYROIDECTOMY;  Surgeon: Oralee Billow, MD;  Location: [facility];  Service: General;  Laterality: Left;   REPLACEMENT TOTAL KNEE BILATERAL     TOTAL HIP ARTHROPLASTY Right    Patient Active Problem List   Diagnosis Date Noted   Facet arthropathy, lumbar 07/17/2023   Chronic left sacroiliac joint pain 07/17/2023   Lumbar trigger point syndrome 04/12/2022   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) 02/14/2022   Painful total knee replacement, left (HCC) 02/01/2022   Acquired leg length discrepancy 08/23/2021   Degenerative lumbar spinal stenosis 08/23/2021   Rectocele 07/07/2021   S/P hysterectomy 07/07/2021   Hyperparathyroidism, primary (HCC) 03/06/2021   Encounter for screening fecal occult blood testing 05/13/2020   Pelvic relaxation 05/13/2020   Encounter for  well woman exam with routine gynecological exam 03/25/2018   Screening for colorectal cancer 03/25/2018   Fecal occult blood test positive 03/25/2018   Current use of estrogen therapy 03/25/2018   Orthostatic hypotension 05/03/2017   Syncope due to orthostatic hypotension 05/03/2017   Symptomatic cholelithiasis 05/02/2017   Acute cholecystitis 05/02/2017   Abnormal nuclear stress test    Chest pain 04/27/2017   Hyperglycemia 04/27/2017   Anemia 04/27/2017   Essential hypertension 07/15/2014   Mild persistent asthma without complication 07/15/2014   Vitamin D  deficiency 07/15/2014    PCP: Lorre Rosin, NP  REFERRING PROVIDER: Isidro Margo, DO   REFERRING DIAG: Arthritis of knee, Balance problem, Coordination problem   THERAPY DIAG:  Other abnormalities of gait and mobility  History of falling  Rationale for Evaluation and Treatment: Rehabilitation  ONSET DATE: chronic  SUBJECTIVE:   SUBJECTIVE STATEMENT: Patient reports doing better over all still. Practiced balance at home.. DC after 1 more visits  PERTINENT HISTORY: Hypertension, asthma, allergies, arthritis, history of COVID-19, and depression PAIN:  Are you having pain? No  PRECAUTIONS: Fall  RED FLAGS: None   WEIGHT BEARING RESTRICTIONS: No  FALLS:  Has patient fallen in last 6 months? Yes. Number of falls 3-4  LIVING ENVIRONMENT: Lives with: lives alone Lives in: House/apartment Stairs: Yes: External: 2 steps; none; step to pattern Has following equipment at home: None  OCCUPATION: retired  PLOF:  Independent  PATIENT GOALS: improved safety and reduced fall risk  NEXT MD VISIT: none scheduled  OBJECTIVE:  Note: Objective measures were completed at Evaluation unless otherwise noted.  PATIENT SURVEYS:  LEFS 18/80  COGNITION: Overall cognitive status: Within functional limits for tasks assessed     SENSATION: Patient reports no numbness or tingling  PALPATION: No tenderness to  palpation reported  LOWER EXTREMITY ROM:  Active ROM Right eval Left eval  Hip flexion    Hip extension    Hip abduction    Hip adduction    Hip internal rotation    Hip external rotation    Knee flexion 130 126  Knee extension 6 3  Ankle dorsiflexion    Ankle plantarflexion    Ankle inversion    Ankle eversion     (Blank rows = not tested)  LOWER EXTREMITY MMT:  MMT Right eval Left eval  Hip flexion 4-/5 4-/5  Hip extension    Hip abduction    Hip adduction    Hip internal rotation    Hip external rotation    Knee flexion 4+/5 4+/5  Knee extension 4+/5 4/5  Ankle dorsiflexion 3/5 3/5  Ankle plantarflexion    Ankle inversion    Ankle eversion     (Blank rows = not tested)  FUNCTIONAL TESTS:  5 times sit to stand: 21.59 second without UE support Timed up and go (TUG): 14.97 seconds  GAIT: Assistive device utilized: None Level of assistance: Complete Independence Comments: decreased stride length, gait speed, and reduced knee flexion with swing phase bilaterally                                                                                                                              TREATMENT DATE:                                     11/15/23 EXERCISE LOG  Exercise Repetitions and Resistance Comments  Nustep  L6 x 15 minutes   Rocker board  4 minutes each  AP and lateral; 2 HHA from parallel bars  Leg press 1 plate @ seat 6 x 5 minutes    Marching on BOSU    Seated HS curl Green t-band  x 30 each side    SLS X 4 mins total  5 sec max holds  Tandem stance X 4 mins    Blank cell = exercise not performed today  Reviewed HEP for balance                                   11/06/23 EXERCISE LOG  Exercise Repetitions and Resistance Comments  Nustep  L5 x 15.5 minutes    Cybex leg press  1 plate @ seat 6 x 4 minutes    Single leg  high knee to hip extension 2 minutes   Rocker board  2 minutes each AP and lateral; 2-1 HHA from parallel bars  Sit to stand 20  reps  Without UE support  Walking on foam  3 minutes  Narrow BOS w/ BUE support from parallel bars  Seated HS stretch  3 x 30 seconds each    Blank cell = exercise not performed today                                    11/02/23 EXERCISE LOG  Exercise Repetitions and Resistance Comments  Nustep  L5 x 15 minutes   Seated clams  Blue t-band x 3 minutes   LAQ Blue t-band x 10 reps    Side stepping on foam  3 minutes  2 HHA progressing to 1 HHA   Rocker board  2 minutes each AP and lateral; 1 HHA   Standing gastroc stretch  2 minutes    Blank cell = exercise not performed today   PATIENT EDUCATION:  Education details: exercise progression Person educated: Patient Education method: Explanation Education comprehension: verbalized understanding  HOME EXERCISE PROGRAM:   ASSESSMENT:  CLINICAL IMPRESSION: Patient  arrived today doing better overall and reports practicing balance HEP at home. Rx focused again  on LE strengthening as well as balance act.'s. Pt did well and will DC after next visit. Check LTG's    OBJECTIVE IMPAIRMENTS: Abnormal gait, decreased activity tolerance, decreased balance, decreased mobility, difficulty walking, decreased strength, and pain.   ACTIVITY LIMITATIONS: carrying, lifting, standing, squatting, stairs, transfers, and locomotion level  PARTICIPATION LIMITATIONS: meal prep, cleaning, laundry, shopping, community activity, and yard work  PERSONAL FACTORS: Age, Past/current experiences, Time since onset of injury/illness/exacerbation, and 3+ comorbidities: Hypertension, asthma, allergies, arthritis, history of COVID-19, and depression are also affecting patient's functional outcome.   REHAB POTENTIAL: Good  CLINICAL DECISION MAKING: Evolving/moderate complexity  EVALUATION COMPLEXITY: Moderate   GOALS: Goals reviewed with patient? Yes  SHORT TERM GOALS: Target date: 10/30/23 Patient will be independent with her initial HEP. Baseline: Goal status:  MET  2.  Patient will improve her 5 times sit to stand time to 16 seconds or less for improved lower extremity power. Baseline: 5/27: 14.78 seconds Goal status: MET  3.  Patient will improve her bilateral hip flexor strength to at least 4/5 for improved functional mobility. Baseline: 5/27: 4+/5 RLE 4/5 LLE Goal status: MET  LONG TERM GOALS: Target date: 11/20/23  Patient will be independent with her advanced HEP. Baseline:  Goal status: Partially Met  2.  Patient will improve her 5 times sit to stand tab to 12 seconds or less to reduce her fall risk. Baseline: 5/27: 14.78 seconds  Goal status:  Partially met  3.  Patient will improve her timed up and go time to 12 seconds or less to reduce her fall risk. Baseline: 5/27: 11.74 seconds Goal status: MET  4.  Patient will improve her LEFS score to at least 35/80 for improved perceived function with her daily activities. Baseline: 5/27: 23/80 Goal status: IN PROGRESS  PLAN:  PT FREQUENCY: 2x/week  PT DURATION: 6 weeks  PLANNED INTERVENTIONS: 97164- PT Re-evaluation, 97750- Physical Performance Testing, 97110-Therapeutic exercises, 97530- Therapeutic activity, 97112- Neuromuscular re-education, 97535- Self Care, 09811- Manual therapy, [phone]- Gait training, [phone]- Electrical stimulation (unattended), 97016- Vasopneumatic device, Patient/Family education, Balance training, Stair training, Joint mobilization, Cryotherapy, and Moist heat  PLAN  FOR NEXT SESSION:    1   visits left then DC to HEP     NuStep, lower extremity strengthening, balance interventions, gait training   Agastya Meister,CHRIS, PTA 11/15/2023, 10:34 AM

## 2023-11-20 ENCOUNTER — Encounter

## 2023-11-22 ENCOUNTER — Encounter

## 2023-12-19 ENCOUNTER — Other Ambulatory Visit (HOSPITAL_BASED_OUTPATIENT_CLINIC_OR_DEPARTMENT_OTHER): Payer: Self-pay | Admitting: Adult Health Nurse Practitioner

## 2023-12-19 DIAGNOSIS — Z1231 Encounter for screening mammogram for malignant neoplasm of breast: Secondary | ICD-10-CM

## 2024-01-07 ENCOUNTER — Telehealth: Payer: Self-pay | Admitting: Family Medicine

## 2024-01-07 ENCOUNTER — Other Ambulatory Visit: Payer: Self-pay

## 2024-01-07 DIAGNOSIS — M5416 Radiculopathy, lumbar region: Secondary | ICD-10-CM

## 2024-01-07 NOTE — Telephone Encounter (Signed)
 Patient called and said she would like to see the same doctor that she saw last to get an injection as that is the only time the injections have worked for her. She can't find the doctor that she had back in February but would like to have him again. She would like to get another injection in her lower back. Please advise.

## 2024-01-10 ENCOUNTER — Other Ambulatory Visit: Payer: Self-pay | Admitting: Adult Health

## 2024-01-10 ENCOUNTER — Other Ambulatory Visit: Payer: Self-pay | Admitting: Family Medicine

## 2024-01-14 ENCOUNTER — Ambulatory Visit (HOSPITAL_BASED_OUTPATIENT_CLINIC_OR_DEPARTMENT_OTHER)
Admission: RE | Admit: 2024-01-14 | Discharge: 2024-01-14 | Disposition: A | Discharge status: Home / Self Care | Source: Ambulatory Visit | Attending: Adult Health Nurse Practitioner | Admitting: Adult Health Nurse Practitioner

## 2024-01-14 ENCOUNTER — Encounter (HOSPITAL_BASED_OUTPATIENT_CLINIC_OR_DEPARTMENT_OTHER): Payer: Self-pay | Admitting: Radiology

## 2024-01-14 DIAGNOSIS — Z1231 Encounter for screening mammogram for malignant neoplasm of breast: Secondary | ICD-10-CM | POA: Diagnosis present

## 2024-01-17 NOTE — Discharge Instructions (Signed)

## 2024-01-18 ENCOUNTER — Ambulatory Visit
Admission: RE | Admit: 2024-01-18 | Discharge: 2024-01-18 | Disposition: A | Discharge status: Home / Self Care | Source: Ambulatory Visit | Attending: Family Medicine | Admitting: Family Medicine

## 2024-01-18 ENCOUNTER — Other Ambulatory Visit: Payer: Self-pay | Admitting: *Deleted

## 2024-01-18 DIAGNOSIS — M5416 Radiculopathy, lumbar region: Secondary | ICD-10-CM

## 2024-01-18 MED ORDER — METHYLPREDNISOLONE ACETATE 40 MG/ML INJ SUSP (RADIOLOG
80.0000 mg | Freq: Once | INTRAMUSCULAR | Status: AC
  Administered 2024-01-18: 80 mg via EPIDURAL

## 2024-01-18 MED ORDER — IOPAMIDOL (ISOVUE-M 200) INJECTION 41%
1.0000 mL | Freq: Once | INTRAMUSCULAR | Status: AC
  Administered 2024-01-18: 1 mL via EPIDURAL

## 2024-02-05 ENCOUNTER — Other Ambulatory Visit: Payer: Self-pay | Admitting: *Deleted

## 2024-02-25 NOTE — Progress Notes (Unsigned)
 [facility] [address] [place] [address] Phone: [phone] Subjective:   LILLETTE Berwyn Posey, am serving as a scribe for Dr. Arthea Claudene.  I'm seeing this patient by the request  of:  Suanne Pfeiffer, NP  CC: Back pain follow-up  YEP:Dlagzrupcz  07/17/2023 Facet arthropathy that I do think is causing more the discomfort and pain in the back.  Patient has seen other providers and they have discussed the possibility of surgical intervention.  Discussed icing regimen and home exercises, discussed which activities to do and which ones to avoid.  We discussed the possibility of with the patient responding to the L4-L5 radiofrequency ablation a year ago to consider the L3-L4 and potentially even L5-S1 if the interventional radiologist felt like this would be appropriate.  Will try a medial branch block first.  Attempted a sacroiliac joint injection as well for diagnostic and therapeutic purposes.  Follow-up again in 6 to 8 weeks otherwise.  Gabapentin  200 mg to take at night given as well.     Updated 02/26/2024 Quaneshia Wareing is a 73 y.o. female coming in with complaint of back pain. Patient states that she had epidural 01/18/2024. Hurting more than she was prior to injection. Constant pain.   Stiffness in both knees. History of B TKR. [facility]        Past Medical History:  Diagnosis Date   Allergy    Arthritis    Asthma    Chronic fatigue    COVID-19    Depression    Hyperparathyroidism    Hypertension    Migraines    cluster   Sleep apnea    Past Surgical History:  Procedure Laterality Date   ABDOMINAL HYSTERECTOMY  1985   CHOLECYSTECTOMY N/A 05/02/2017   Procedure: LAPAROSCOPIC CHOLECYSTECTOMY;  Surgeon: Stevie Herlene Righter, MD;  Location: [facility];  Service: General;  Laterality: N/A;   LEFT HEART CATH AND CORONARY ANGIOGRAPHY N/A 04/30/2017   Procedure: LEFT HEART CATH AND CORONARY ANGIOGRAPHY;   Surgeon: Court Dorn PARAS, MD;  Location: [facility];  Service: Cardiovascular;  Laterality: N/A;   LIGAMENT REPAIR Right    birth defect   PARATHYROIDECTOMY Left 03/10/2021   Procedure: LEFT INFERIOR PARATHYROIDECTOMY;  Surgeon: Eletha Boas, MD;  Location: [facility];  Service: General;  Laterality: Left;   REPLACEMENT TOTAL KNEE BILATERAL     TOTAL HIP ARTHROPLASTY Right    Social History   Socioeconomic History   Marital status: Married    Spouse name: Not on file   Number of children: Not on file   Years of education: Not on file   Highest education level: Not on file  Occupational History   Occupation: Architect  Tobacco Use   Smoking status: Never   Smokeless tobacco: Never  Vaping Use   Vaping status: Never Used  Substance and Sexual Activity   Alcohol use: Yes    Alcohol/week: 0.0 standard drinks of alcohol    Comment: rare   Drug use: Never   Sexual activity: Not Currently    Birth control/protection: Surgical    Comment: hysto  Other Topics Concern   Not on file  Social History Narrative   ** Merged History Encounter **       Social Drivers of Health   Financial Resource Strain: Low Risk  (01/19/2024)   Received from [facility]   Overall Financial Resource Strain (CARDIA)  How hard is it for you to pay for the very basics like food, housing, medical care, and heating?: Not very hard  Food Insecurity: No Food Insecurity (01/19/2024)   Received from [facility]   Hunger Vital Sign    Within the past 12 months, you worried that your food would run out before you got the money to buy more.: Never true    Within the past 12 months, the food you bought just didn't last and you didn't have money to get more.: Never true  Transportation Needs: No Transportation Needs (01/19/2024)   Received from [facility] - Transportation    In the past 12 months, has lack of transportation kept you from medical appointments or from getting  medications?: No    In the past 12 months, has lack of transportation kept you from meetings, work, or from getting things needed for daily living?: No  Physical Activity: Sufficiently Active (01/19/2024)   Received from [facility]   Exercise Vital Sign    On average, how many days per week do you engage in moderate to strenuous exercise (like a brisk walk)?: 7 days    On average, how many minutes do you engage in exercise at this level?: 30 min  Stress: No Stress Concern Present (01/19/2024)   Received from [facility] - Occupational Stress Questionnaire    Do you feel stress - tense, restless, nervous, or anxious, or unable to sleep at night because your mind is troubled all the time - these days?: Not at all  Social Connections: Somewhat Isolated (01/19/2024)   Received from [facility]   Social Network    How would you rate your social network (family, work, friends)?: Restricted participation with some degree of social isolation   Allergies  Allergen Reactions   Codeine Nausea And Vomiting   Keflex  [Cephalexin ] Nausea And Vomiting   Lisinopril  Cough   Family History  Problem Relation Age of Onset   Cancer Paternal Grandfather    Heart disease Paternal Grandmother    Cancer Maternal Grandfather    Cancer Father    Dementia Mother    Cancer Mother    Hypertension Brother    Cancer Son        testicular & lung   CAD Neg Hx     Current Outpatient Medications (Endocrine & Metabolic):    estradiol  (ESTRACE ) 1 MG tablet, TAKE 1 TABLET BY MOUTH EVERY DAY   methylPREDNISolone  (MEDROL ) 4 MG tablet, Medrol  dose pack. Take as instructed   predniSONE  (DELTASONE ) 20 MG tablet, Take 1 tablet (20 mg total) by mouth daily with breakfast.  Current Outpatient Medications (Cardiovascular):    amLODipine  (NORVASC ) 5 MG tablet, Take 5 mg by mouth daily.   hydrochlorothiazide  (HYDRODIURIL ) 25 MG tablet, Take 25 mg by mouth daily.   metoprolol   succinate (TOPROL -XL) 25 MG 24 hr tablet, Take 12.5 mg by mouth daily.  Current Outpatient Medications (Respiratory):    albuterol  (PROVENTIL  HFA;VENTOLIN  HFA) 108 (90 BASE) MCG/ACT inhaler, Inhale 2 puffs into the lungs every 6 (six) hours as needed for wheezing.   fluticasone -salmeterol (ADVAIR) 250-50 MCG/ACT AEPB, Inhale 1 puff into the lungs in the morning and at bedtime.   montelukast  (SINGULAIR ) 10 MG tablet, Take 1 tablet (10 mg total) by mouth at bedtime.  Current Outpatient Medications (Analgesics):    aspirin  81 MG tablet, Take 81 mg by mouth daily.   meloxicam  (MOBIC ) 15 MG tablet, TAKE  ONE TABLET BY MOUTH ONCE DAILY   traMADol  (ULTRAM ) 50 MG tablet, Take 1-2 tablets (50-100 mg total) by mouth every 6 (six) hours as needed for moderate pain.   Current Outpatient Medications (Other):    Cholecalciferol (VITAMIN D3) 5000 units CAPS, Take 5,000 Units by mouth daily.   cyclobenzaprine  (FLEXERIL ) 5 MG tablet, TAKE ONE TABLET BY MOUTH THREE TIMES DAILY AS NEEDED FOR MUSCLE SPASM   doxycycline  (VIBRA -TABS) 100 MG tablet, Take 1 tablet (100 mg total) by mouth 2 (two) times daily.   DULoxetine  (CYMBALTA ) 20 MG capsule, Take 1 capsule (20 mg total) by mouth daily.   escitalopram (LEXAPRO) 5 MG tablet, Take 5 mg by mouth daily.   gabapentin  (NEURONTIN ) 100 MG capsule, TAKE 2 CAPSULES BY MOUTH AT BEDTIME   mupirocin  ointment (BACTROBAN ) 2 %, Apply 1 Application topically 2 (two) times daily.   OVER THE COUNTER MEDICATION, Take 1 capsule by mouth in the morning and at bedtime. Super Beets   Potassium 99 MG TABS, Take 1 tablet by mouth daily.   Probiotic Product (PROBIOTIC PO), Take by mouth.   Vitamin A 2400 MCG (8000 UT) CAPS, Take 8,000 Units by mouth daily.   vitamin E 180 MG (400 UNITS) capsule, Take 400 Units by mouth daily.   zinc gluconate 50 MG tablet, Take 50 mg by mouth daily.   Reviewed prior external information including notes and imaging from  primary care provider As well  as notes that were available from care everywhere and other healthcare systems.  Past medical history, social, surgical and family history all reviewed in electronic medical record.  No pertanent information unless stated regarding to the chief complaint.   Review of Systems:  No headache, visual changes, nausea, vomiting, diarrhea, constipation, dizziness, abdominal pain, skin rash, fevers, chills, night sweats, weight loss, swollen lymph nodes, body aches, joint swelling, chest pain, shortness of breath, mood changes. POSITIVE muscle aches  Objective  Blood pressure 110/72, pulse 96, height 5' 4 (1.626 m), weight 213 lb (96.6 kg), SpO2 96%.   General: No apparent distress alert and oriented x3 mood and affect normal, dressed appropriately.  HEENT: Pupils equal, extraocular movements intact  Respiratory: Patient's speak in full sentences and does not appear short of breath  Cardiovascular: No lower extremity edema, non tender, no erythema  Patient appears to be extremely uncomfortable.  Tenderness to palpation diffusely in the lumbar spine that seems worse left greater than right.  Mild potential lipoma noted that is freely moving and tender over the L4-L5 area on the left side.  Patient does have some mild midline tenderness.  Worsening pain with any type of extension of the back noted as well.    Impression and Recommendations:    The above documentation has been reviewed and is accurate and complete Kamryn Messineo M Kerrion Kemppainen, DO

## 2024-02-26 ENCOUNTER — Ambulatory Visit: Payer: Self-pay | Admitting: Family Medicine

## 2024-02-26 ENCOUNTER — Ambulatory Visit

## 2024-02-26 ENCOUNTER — Ambulatory Visit: Admitting: Family Medicine

## 2024-02-26 ENCOUNTER — Encounter: Payer: Self-pay | Admitting: Family Medicine

## 2024-02-26 VITALS — BP 110/72 | HR 96 | Ht 64.0 in | Wt 213.0 lb

## 2024-02-26 DIAGNOSIS — M5416 Radiculopathy, lumbar region: Secondary | ICD-10-CM | POA: Diagnosis not present

## 2024-02-26 DIAGNOSIS — M48061 Spinal stenosis, lumbar region without neurogenic claudication: Secondary | ICD-10-CM | POA: Diagnosis not present

## 2024-02-26 LAB — URINALYSIS
Bilirubin Urine: NEGATIVE
Hgb urine dipstick: NEGATIVE
Ketones, ur: NEGATIVE
Leukocytes,Ua: NEGATIVE
Nitrite: NEGATIVE
Specific Gravity, Urine: 1.025 (ref 1.000–1.030)
Total Protein, Urine: NEGATIVE
Urine Glucose: NEGATIVE
Urobilinogen, UA: 0.2 (ref 0.0–1.0)
pH: 6 (ref 5.0–8.0)

## 2024-02-26 MED ORDER — KETOROLAC TROMETHAMINE 60 MG/2ML IM SOLN
60.0000 mg | Freq: Once | INTRAMUSCULAR | Status: AC
  Administered 2024-02-26: 60 mg via INTRAMUSCULAR

## 2024-02-26 MED ORDER — METHYLPREDNISOLONE ACETATE 80 MG/ML IJ SUSP
80.0000 mg | Freq: Once | INTRAMUSCULAR | Status: AC
  Administered 2024-02-26: 80 mg via INTRAMUSCULAR

## 2024-02-26 NOTE — Assessment & Plan Note (Signed)
 Unfortunately patient's pain seems to be worsening and no longer responding to the injections patient has had previously.  Still taking tramadol  at least 1 time a day.  Pain is affecting daily activities.  Patient did travel but unfortunately had worsening discomfort and missed some things with the family.  Toradol  and Depo-Medrol  injections given today.  Discussed if it does not seem to make significant improvement until patient's MRI nearly 72 years old would need to consider repeating MRI with the severity of pain.  Depending on the findings she could be a potential candidate also for spinal stimulator.  Follow-up with me again in 1 to 2 months.

## 2024-02-26 NOTE — Patient Instructions (Addendum)
 Lab today Xray today Full cocktail in backside today.  If not better by Friday let us  know and we'll order MRI

## 2024-04-01 ENCOUNTER — Other Ambulatory Visit: Payer: Self-pay | Admitting: Family Medicine

## 2024-05-27 ENCOUNTER — Other Ambulatory Visit: Payer: Self-pay | Admitting: Adult Health

## 2024-06-27 ENCOUNTER — Other Ambulatory Visit: Payer: Self-pay | Admitting: Adult Health

## 2024-07-10 ENCOUNTER — Other Ambulatory Visit: Payer: Self-pay

## 2024-07-10 ENCOUNTER — Telehealth: Payer: Self-pay | Admitting: Family Medicine

## 2024-07-10 DIAGNOSIS — M47816 Spondylosis without myelopathy or radiculopathy, lumbar region: Secondary | ICD-10-CM

## 2024-07-10 NOTE — Telephone Encounter (Signed)
 Patient called and states that the last time she was here it was discussed about  her getting an implant in her back for the pain. She wanted to know if that could be scheduled or if she needed to come back in to see Dr. Claudene. Please advise.
